# Patient Record
Sex: Female | Born: 1940 | Race: White | Hispanic: No | Marital: Married | Smoking: Never smoker
Health system: Southern US, Community
[De-identification: ages and names within clinical notes are randomized; demographics above are authoritative.]

## PROBLEM LIST (undated history)

## (undated) DIAGNOSIS — S32000A Wedge compression fracture of unspecified lumbar vertebra, initial encounter for closed fracture: Secondary | ICD-10-CM

## (undated) DIAGNOSIS — D649 Anemia, unspecified: Secondary | ICD-10-CM

## (undated) DIAGNOSIS — F419 Anxiety disorder, unspecified: Secondary | ICD-10-CM

## (undated) DIAGNOSIS — K449 Diaphragmatic hernia without obstruction or gangrene: Secondary | ICD-10-CM

## (undated) DIAGNOSIS — F429 Obsessive-compulsive disorder, unspecified: Secondary | ICD-10-CM

## (undated) HISTORY — PX: COLONOSCOPY: SHX174

## (undated) HISTORY — PX: ABDOMINAL HYSTERECTOMY: SHX81

---

## 2016-08-15 ENCOUNTER — Emergency Department (HOSPITAL_BASED_OUTPATIENT_CLINIC_OR_DEPARTMENT_OTHER): Payer: Medicare Other

## 2016-08-15 ENCOUNTER — Encounter (HOSPITAL_BASED_OUTPATIENT_CLINIC_OR_DEPARTMENT_OTHER): Payer: Self-pay | Admitting: Emergency Medicine

## 2016-08-15 ENCOUNTER — Emergency Department (HOSPITAL_BASED_OUTPATIENT_CLINIC_OR_DEPARTMENT_OTHER)
Admission: EM | Admit: 2016-08-15 | Discharge: 2016-08-15 | Disposition: A | Discharge status: Home / Self Care | Payer: Medicare Other | Attending: Emergency Medicine | Admitting: Emergency Medicine

## 2016-08-15 DIAGNOSIS — H6122 Impacted cerumen, left ear: Secondary | ICD-10-CM | POA: Insufficient documentation

## 2016-08-15 DIAGNOSIS — Z79891 Long term (current) use of opiate analgesic: Secondary | ICD-10-CM | POA: Diagnosis not present

## 2016-08-15 DIAGNOSIS — R42 Dizziness and giddiness: Secondary | ICD-10-CM

## 2016-08-15 HISTORY — DX: Anemia, unspecified: D64.9

## 2016-08-15 HISTORY — DX: Anxiety disorder, unspecified: F41.9

## 2016-08-15 HISTORY — DX: Obsessive-compulsive disorder, unspecified: F42.9

## 2016-08-15 HISTORY — DX: Diaphragmatic hernia without obstruction or gangrene: K44.9

## 2016-08-15 LAB — COMPREHENSIVE METABOLIC PANEL
ALK PHOS: 79 U/L (ref 38–126)
ALT: 15 U/L (ref 14–54)
ANION GAP: 5 (ref 5–15)
AST: 20 U/L (ref 15–41)
Albumin: 3.8 g/dL (ref 3.5–5.0)
BUN: 22 mg/dL — ABNORMAL HIGH (ref 6–20)
CALCIUM: 9.2 mg/dL (ref 8.9–10.3)
CO2: 25 mmol/L (ref 22–32)
CREATININE: 0.63 mg/dL (ref 0.44–1.00)
Chloride: 108 mmol/L (ref 101–111)
Glucose, Bld: 92 mg/dL (ref 65–99)
Potassium: 4.1 mmol/L (ref 3.5–5.1)
Sodium: 138 mmol/L (ref 135–145)
Total Bilirubin: 0.3 mg/dL (ref 0.3–1.2)
Total Protein: 6.7 g/dL (ref 6.5–8.1)

## 2016-08-15 LAB — CBC WITH DIFFERENTIAL/PLATELET
Basophils Absolute: 0.1 10*3/uL (ref 0.0–0.1)
Basophils Relative: 1 %
EOS PCT: 2 %
Eosinophils Absolute: 0.2 10*3/uL (ref 0.0–0.7)
HCT: 41.3 % (ref 36.0–46.0)
HEMOGLOBIN: 13.3 g/dL (ref 12.0–15.0)
LYMPHS ABS: 1.6 10*3/uL (ref 0.7–4.0)
LYMPHS PCT: 19 %
MCH: 26.4 pg (ref 26.0–34.0)
MCHC: 32.2 g/dL (ref 30.0–36.0)
MCV: 82.1 fL (ref 78.0–100.0)
MONOS PCT: 10 %
Monocytes Absolute: 0.8 10*3/uL (ref 0.1–1.0)
Neutro Abs: 5.6 10*3/uL (ref 1.7–7.7)
Neutrophils Relative %: 68 %
PLATELETS: 242 10*3/uL (ref 150–400)
RBC: 5.03 MIL/uL (ref 3.87–5.11)
RDW: 20.1 % — ABNORMAL HIGH (ref 11.5–15.5)
WBC: 8.2 10*3/uL (ref 4.0–10.5)

## 2016-08-15 LAB — TROPONIN I

## 2016-08-15 MED ORDER — SODIUM CHLORIDE 0.9 % IV BOLUS (SEPSIS)
1000.0000 mL | Freq: Once | INTRAVENOUS | Status: AC
  Administered 2016-08-15: 1000 mL via INTRAVENOUS

## 2016-08-15 NOTE — ED Notes (Signed)
Called and spoke to Dr. Lawerance Bachuehle's office.  They related that patient has been complaining of dizziness, weakness, and near syncopal episodes.  They had instructed the patient to go to the ED three times thus far.  Pt presented to our ED requesting blood work only.

## 2016-08-15 NOTE — ED Triage Notes (Signed)
Pt had colonoscopy on Friday.  Went well.  Pt felt dizziness with diaphoresis and generalized weakness which decreased by Monday.  Pt continued to have symptoms but less than Sunday.  Pt able to walk to room without difficulty.

## 2016-08-15 NOTE — ED Provider Notes (Signed)
[facility]   CSN: [id number] Arrival date & time: 08/15/16  [id number]     History   Chief Complaint Chief Complaint  Patient presents with  . Dizziness    HPI Amber Ruiz is a 75 y.o. female hx of iron deficiency anemia, hiatal hernia, here with vertigo, dizziness, near syncope. Patient had a colonoscopy done 4 days ago that was unremarkable. Done as part of a workup for iron deficiency anemia. 2 days ago she went out to eat with her friends at church and had an episode of dizziness and diaphoresis and vertigo. She states that she had trouble walking at that time and went home and laid down and felt better. Yesterday she felt slightly better and felt slightly lightheaded but the vertigo has improved. Felt like she is on pass out but did not pass out. She denies any chest pain or abdominal pain or abdominal distention. She denies any vomiting. Sent here by her doctor for possible dehydration. Denies focal weakness or trouble speaking.   The history is provided by the patient.    Past Medical History:  Diagnosis Date  . Anemia   . Anxiety   . Hiatal hernia   . OCD (obsessive compulsive disorder)     There are no active problems to display for this patient.   Past Surgical History:  Procedure Laterality Date  . ABDOMINAL HYSTERECTOMY    . COLONOSCOPY      OB History    No data available       Home Medications    Prior to Admission medications   Medication Sig Start Date End Date Taking? Authorizing Provider  cetirizine (ZYRTEC) 5 MG tablet Take 5 mg by mouth daily.   Yes Historical Provider, MD  citalopram (CELEXA) 40 MG tablet Take 20 mg by mouth daily.    Yes Historical Provider, MD  pantoprazole (PROTONIX) 20 MG tablet Take 20 mg by mouth daily.   Yes Historical Provider, MD    Family History No family history on file.  Social History Social History  Substance Use Topics  . Smoking status: Never Smoker  . Smokeless tobacco: Never  Used  . Alcohol use No     Allergies   Review of patient's allergies indicates no known allergies.   Review of Systems Review of Systems  Neurological: Positive for dizziness.  All other systems reviewed and are negative.    Physical Exam Updated Vital Signs BP 134/63 (BP Location: Left Arm)   Pulse 63 Comment: standing  Temp 98.3 F (36.8 C) (Oral)   Resp 16 Comment: standing  Ht 5\' 2"  (1.575 m)   Wt 133 lb (60.3 kg)   SpO2 99% Comment: standing  BMI 24.33 kg/m   Physical Exam  Constitutional: She is oriented to person, place, and time.  Chronically ill, NAD   HENT:  Head: Normocephalic.  L cerumen impaction, R TM nl. OP clear   Eyes: EOM are normal. Pupils are equal, round, and reactive to light.  No obvious nystagmus   Neck: Normal range of motion. Neck supple.  Cardiovascular: Normal rate, regular rhythm and normal heart sounds.   Pulmonary/Chest: Effort normal and breath sounds normal. No respiratory distress. She has no wheezes. She has no rales.  Abdominal: Soft. Bowel sounds are normal. She exhibits no distension. There is no tenderness.  Musculoskeletal: Normal range of motion.  Neurological: She is alert and oriented to person, place, and time. No cranial nerve deficit. Coordination normal.  CN 2-12  intact. Nl strength throughout. Nl gait   Skin: Skin is warm.  Psychiatric: She has a normal mood and affect.  Nursing note and vitals reviewed.    ED Treatments / Results  Labs (all labs ordered are listed, but only abnormal results are displayed) Labs Reviewed  CBC WITH DIFFERENTIAL/PLATELET  COMPREHENSIVE METABOLIC PANEL  TROPONIN I    EKG  EKG Interpretation None       Radiology Ct Head Wo Contrast  Result Date: 08/15/2016 CLINICAL DATA:  Patient with dizziness, weakness and syncopal episodes. EXAM: CT HEAD WITHOUT CONTRAST TECHNIQUE: Contiguous axial images were obtained from the base of the skull through the vertex without intravenous  contrast. COMPARISON:  None. FINDINGS: Brain: Ventricles and sulci are appropriate for patient's age. No evidence for acute cortically based infarct, intracranial hemorrhage, mass lesion or mass-effect. Vascular: No hyperdense vessel or unexpected calcification. Skull: Normal. Negative for fracture or focal lesion. Sinuses/Orbits: No acute finding. Other: None. IMPRESSION: No acute intracranial process. Electronically Signed   By: Annia Belt M.D.   On: 08/15/2016 10:35    Procedures .Ear Cerumen Removal Date/Time: 08/15/2016 10:46 AM Performed by: Charlynne Pander Authorized by: Charlynne Pander   Consent:    Consent obtained:  Verbal   Consent given by:  Patient   Risks discussed:  Bleeding   Alternatives discussed:  No treatment Procedure details:    Location:  L ear   Procedure type: curette   Post-procedure details:    Inspection:  TM intact   Patient tolerance of procedure:  Tolerated well, no immediate complications    (including critical care time)  Medications Ordered in ED Medications  sodium chloride 0.9 % bolus 1,000 mL (not administered)     Initial Impression / Assessment and Plan / ED Course  I have reviewed the triage vital signs and the nursing notes.  Pertinent labs & imaging results that were available during my care of the patient were reviewed by me and considered in my medical decision making (see chart for details).  Clinical Course    Amber Ruiz is a 75 y.o. female here with vertigo, dizziness, near syncope. Patient's vertigo was worse 2 days ago and now improved. Nl neuro exam now and has L cerumen impaction. Most likely peripheral vertigo either from BPPV or cerumen. Consider stroke as well but since the vertigo is improved and symptoms onset 2 days ago and nl neuro exam, if CT head showed no obvious stroke, does not need MRI. No hx of CAD and will get labs and orthostatics, trop x 1, EKG. Will hydrate and reassess.   11:46 AM Patient not  orthostatic. BUN 22 likely from mild dehydration. No vomiting. CT head nl. Given 1 L NS bolus. Will dc home.   Final Clinical Impressions(s) / ED Diagnoses   Final diagnoses:  None    New Prescriptions New Prescriptions   No medications on file     Charlynne Pander, MD 08/15/16 1147

## 2016-08-15 NOTE — Discharge Instructions (Signed)
Stay hydrated.   Rest for several days.   See your doctor  Return to ER if you have worse dizziness, light headedness, passing out, vertigo, weakness, trouble speaking

## 2016-08-15 NOTE — ED Notes (Signed)
Patient transported to X-ray 

## 2019-06-16 ENCOUNTER — Emergency Department (HOSPITAL_BASED_OUTPATIENT_CLINIC_OR_DEPARTMENT_OTHER)
Admission: EM | Admit: 2019-06-16 | Discharge: 2019-06-16 | Disposition: A | Discharge status: Home / Self Care | Payer: Medicare Other | Attending: Emergency Medicine | Admitting: Emergency Medicine

## 2019-06-16 ENCOUNTER — Emergency Department (HOSPITAL_BASED_OUTPATIENT_CLINIC_OR_DEPARTMENT_OTHER): Payer: Medicare Other

## 2019-06-16 ENCOUNTER — Other Ambulatory Visit: Payer: Self-pay

## 2019-06-16 ENCOUNTER — Encounter (HOSPITAL_BASED_OUTPATIENT_CLINIC_OR_DEPARTMENT_OTHER): Payer: Self-pay | Admitting: *Deleted

## 2019-06-16 DIAGNOSIS — Z79899 Other long term (current) drug therapy: Secondary | ICD-10-CM | POA: Insufficient documentation

## 2019-06-16 DIAGNOSIS — R42 Dizziness and giddiness: Secondary | ICD-10-CM | POA: Diagnosis present

## 2019-06-16 LAB — CBC WITH DIFFERENTIAL/PLATELET
Abs Immature Granulocytes: 0.02 10*3/uL (ref 0.00–0.07)
Basophils Absolute: 0.1 10*3/uL (ref 0.0–0.1)
Basophils Relative: 1 %
Eosinophils Absolute: 0 10*3/uL (ref 0.0–0.5)
Eosinophils Relative: 0 %
HCT: 48.9 % — ABNORMAL HIGH (ref 36.0–46.0)
Hemoglobin: 15.3 g/dL — ABNORMAL HIGH (ref 12.0–15.0)
Immature Granulocytes: 0 %
Lymphocytes Relative: 21 %
Lymphs Abs: 1.5 10*3/uL (ref 0.7–4.0)
MCH: 27.4 pg (ref 26.0–34.0)
MCHC: 31.3 g/dL (ref 30.0–36.0)
MCV: 87.6 fL (ref 80.0–100.0)
Monocytes Absolute: 0.6 10*3/uL (ref 0.1–1.0)
Monocytes Relative: 8 %
Neutro Abs: 5.2 10*3/uL (ref 1.7–7.7)
Neutrophils Relative %: 70 %
Platelets: 253 10*3/uL (ref 150–400)
RBC: 5.58 MIL/uL — ABNORMAL HIGH (ref 3.87–5.11)
RDW: 12.2 % (ref 11.5–15.5)
WBC: 7.4 10*3/uL (ref 4.0–10.5)
nRBC: 0 % (ref 0.0–0.2)

## 2019-06-16 LAB — COMPREHENSIVE METABOLIC PANEL
ALT: 16 U/L (ref 0–44)
AST: 19 U/L (ref 15–41)
Albumin: 4 g/dL (ref 3.5–5.0)
Alkaline Phosphatase: 90 U/L (ref 38–126)
Anion gap: 9 (ref 5–15)
BUN: 16 mg/dL (ref 8–23)
CO2: 28 mmol/L (ref 22–32)
Calcium: 9.3 mg/dL (ref 8.9–10.3)
Chloride: 102 mmol/L (ref 98–111)
Creatinine, Ser: 0.7 mg/dL (ref 0.44–1.00)
GFR calc Af Amer: >60 mL/min (ref >60)
GFR calc non Af Amer: >60 mL/min (ref >60)
Glucose, Bld: 112 mg/dL — ABNORMAL HIGH (ref 70–99)
Potassium: 4.3 mmol/L (ref 3.5–5.1)
Sodium: 139 mmol/L (ref 135–145)
Total Bilirubin: 0.4 mg/dL (ref 0.3–1.2)
Total Protein: 6.9 g/dL (ref 6.5–8.1)

## 2019-06-16 MED ORDER — MECLIZINE HCL 25 MG PO TABS: 25.0000 mg | ORAL_TABLET | Freq: Three times a day (TID) | ORAL | 0 refills | Status: DC | PRN

## 2019-06-16 NOTE — ED Provider Notes (Signed)
[facility] EMERGENCY DEPARTMENT Provider Note   CSN: [id number] Arrival date & time: 06/16/19  1507     History   Chief Complaint Chief Complaint  Patient presents with  . Dizziness    HPI Amber Ruiz is a 78 y.o. female.     Patient with a complaint of dizziness.  Patient stated that the dizziness been ongoing for 2 weeks.  She has been taking meclizine provided by her primary care doctor as needed.  Not taking on a regular basis.  She said for several weeks prior to that she has had some sort of change in taste.  She has been tested for COVID and it was negative.  No fevers no upper respiratory symptoms.  The dizziness is consistent with vertigo sometimes intense.  Intense episode this morning associated with some diaphoresis and nausea and vomiting.  No visual changes no weakness in her arms or legs no numbness.  No coordination problems.  When she is not having the vertigo everything works fine.  No headache.  No falls or injury.     Past Medical History:  Diagnosis Date  . Anemia   . Anxiety   . Hiatal hernia   . OCD (obsessive compulsive disorder)     There are no active problems to display for this patient.   Past Surgical History:  Procedure Laterality Date  . ABDOMINAL HYSTERECTOMY    . COLONOSCOPY       OB History   No obstetric history on file.      Home Medications    Prior to Admission medications   Medication Sig Start Date End Date Taking? Authorizing Provider  alendronate (FOSAMAX) 70 MG tablet TK 1 T PO  PO Q WEEK 01/14/18  Yes [provider]  cetirizine (ZYRTEC) 5 MG tablet Take 5 mg by mouth daily.   Yes [provider]  citalopram (CELEXA) 40 MG tablet Take 20 mg by mouth daily.    Yes [provider]  lisinopril (ZESTRIL) 10 MG tablet Take 10 mg by mouth daily.   Yes [provider]  meclizine (ANTIVERT) 25 MG tablet Take 25 mg by mouth 3 (three) times daily as needed for dizziness.   Yes  [provider]  pantoprazole (PROTONIX) 20 MG tablet Take 20 mg by mouth daily.   Yes [provider]  Ascorbic Acid (VITAMIN C) 100 MG tablet Take by mouth.    [provider]  meclizine (ANTIVERT) 25 MG tablet Take 1 tablet (25 mg total) by mouth 3 (three) times daily as needed for dizziness. 06/16/19   Vanetta MuldersZackowski, Quintasha Gren, MD    Family History No family history on file.  Social History Social History   Tobacco Use  . Smoking status: Never Smoker  . Smokeless tobacco: Never Used  Substance Use Topics  . Alcohol use: No  . Drug use: Not on file     Allergies   Patient has no known allergies.   Review of Systems Review of Systems  Constitutional: Positive for diaphoresis. Negative for chills and fever.  HENT: Negative for congestion, rhinorrhea and sore throat.   Eyes: Negative for visual disturbance.  Respiratory: Negative for cough and shortness of breath.   Cardiovascular: Negative for chest pain and leg swelling.  Gastrointestinal: Positive for nausea and vomiting. Negative for abdominal pain and diarrhea.  Genitourinary: Negative for dysuria.  Musculoskeletal: Negative for back pain and neck pain.  Skin: Negative for rash.  Neurological: Positive for dizziness. Negative for light-headedness  and headaches.  Hematological: Does not bruise/bleed easily.  Psychiatric/Behavioral: Negative for confusion.     Physical Exam Updated Vital Signs BP (!) 183/91   Pulse 63   Temp 98.4 F (36.9 C) (Oral)   Resp 17   Ht 1.524 m (5')   Wt 61.2 kg   SpO2 96%   BMI 26.37 kg/m   Physical Exam Vitals signs and nursing note reviewed.  Constitutional:      General: She is not in acute distress.    Appearance: Normal appearance. She is well-developed.  HENT:     Head: Normocephalic and atraumatic.  Eyes:     Extraocular Movements: Extraocular movements intact.     Conjunctiva/sclera: Conjunctivae normal.     Pupils: Pupils are equal, round, and  reactive to light.  Neck:     Musculoskeletal: Normal range of motion and neck supple.  Cardiovascular:     Rate and Rhythm: Normal rate and regular rhythm.     Heart sounds: No murmur.  Pulmonary:     Effort: Pulmonary effort is normal. No respiratory distress.     Breath sounds: Normal breath sounds.  Abdominal:     Palpations: Abdomen is soft.     Tenderness: There is no abdominal tenderness.  Musculoskeletal: Normal range of motion.  Skin:    General: Skin is warm and dry.     Capillary Refill: Capillary refill takes less than 2 seconds.  Neurological:     General: No focal deficit present.     Mental Status: She is alert and oriented to person, place, and time.     Cranial Nerves: No cranial nerve deficit.     Sensory: No sensory deficit.     Motor: No weakness.     Coordination: Coordination normal.      ED Treatments / Results  Labs (all labs ordered are listed, but only abnormal results are displayed) Labs Reviewed  COMPREHENSIVE METABOLIC PANEL - Abnormal; Notable for the following components:      Result Value   Glucose, Bld 112 (*)    All other components within normal limits  CBC WITH DIFFERENTIAL/PLATELET - Abnormal; Notable for the following components:   RBC 5.58 (*)    Hemoglobin 15.3 (*)    HCT 48.9 (*)    All other components within normal limits    EKG EKG Interpretation  Date/Time:  Monday June 16 2019 16:44:04 EDT Ventricular Rate:  56 PR Interval:    QRS Duration: 89 QT Interval:  515 QTC Calculation: 498 R Axis:   76 Text Interpretation:  Sinus rhythm Nonspecific T abnormalities, lateral leads Borderline prolonged QT interval Baseline wander in lead(s) V2 Confirmed by Fredia Sorrow [phone]) on 06/16/2019 5:39:26 PM   Radiology Ct Head Wo Contrast  Result Date: 06/16/2019 CLINICAL DATA:  Ataxia.  Rule out stroke EXAM: CT HEAD WITHOUT CONTRAST TECHNIQUE: Contiguous axial images were obtained from the base of the skull through the vertex  without intravenous contrast. COMPARISON:  CT head 08/15/2016 FINDINGS: Brain: Ventricle size normal. Negative for infarct, hemorrhage, mass. No midline shift. Vascular: Negative for hyperdense vessel Skull: Negative Sinuses/Orbits: Negative Other: None IMPRESSION: Negative CT head Electronically Signed   By: Franchot Gallo M.D.   On: 06/16/2019 17:06    Procedures Procedures (including critical care time)  Medications Ordered in ED Medications - No data to display   Initial Impression / Assessment and Plan / ED Course  I have reviewed the triage vital signs and the nursing notes.  Pertinent  labs & imaging results that were available during my care of the patient were reviewed by me and considered in my medical decision making (see chart for details).        Patient with intermittent vertigo symptoms.  Actually clarification from her daughter's symptoms actually really started in May but they are sporadic.  Never had several days in a row with the symptoms.  This is the reason why the patient was taken the meclizine as needed.  Does have follow-up with ear nose and throat already scheduled.  Recommend outpatient MRI follow-up with her primary care doctor and consideration for neurology referral as well.  Patient completely asymptomatic currently.  Patient's labs EKG cardiac monitoring and head CT without any acute findings.  Final Clinical Impressions(s) / ED Diagnoses   Final diagnoses:  Vertigo    ED Discharge Orders         Ordered    meclizine (ANTIVERT) 25 MG tablet  3 times daily PRN     06/16/19 1757           Vanetta MuldersZackowski, Gloris Shiroma, MD 06/16/19 1802

## 2019-06-16 NOTE — ED Triage Notes (Signed)
Dizziness x 2 weeks. She has been taking Meclizine with some relief. No pain.

## 2019-06-16 NOTE — Discharge Instructions (Addendum)
Keep your appointment ear nose and throat.  Make an appointment to go back to your regular doctor.  Feel that outpatient MRI would be warranted.  And also consideration for follow-up with neurology.  Take the meclizine as needed for the dizziness.  May be reasonable to take it for the next 24 hours.  Take the medication every 8 hours.  But since her symptoms have been very sporadic it is probably reasonable not to take it every day.

## 2019-06-16 NOTE — ED Notes (Signed)
Patient transported to CT 

## 2019-06-30 ENCOUNTER — Encounter: Payer: Self-pay | Admitting: Neurology

## 2019-06-30 ENCOUNTER — Ambulatory Visit (INDEPENDENT_AMBULATORY_CARE_PROVIDER_SITE_OTHER): Payer: Medicare Other | Admitting: Neurology

## 2019-06-30 ENCOUNTER — Telehealth: Payer: Self-pay | Admitting: Neurology

## 2019-06-30 ENCOUNTER — Other Ambulatory Visit: Payer: Self-pay

## 2019-06-30 VITALS — BP 121/76 | HR 83 | Temp 98.6°F | Ht 59.0 in | Wt 133.3 lb

## 2019-06-30 DIAGNOSIS — R413 Other amnesia: Secondary | ICD-10-CM | POA: Diagnosis not present

## 2019-06-30 DIAGNOSIS — H814 Vertigo of central origin: Secondary | ICD-10-CM | POA: Diagnosis not present

## 2019-06-30 DIAGNOSIS — E538 Deficiency of other specified B group vitamins: Secondary | ICD-10-CM

## 2019-06-30 MED ORDER — TOPIRAMATE 25 MG PO TABS: ORAL_TABLET | ORAL | 3 refills | Status: DC

## 2019-06-30 NOTE — Patient Instructions (Signed)
We will start Topamax for the vertigo and vision changes.   Topamax (topiramate) is a seizure medication that has an FDA approval for seizures and for migraine headache. Potential side effects of this medication include weight loss, cognitive slowing, tingling in the fingers and toes, and carbonated drinks will taste bad. If any significant side effects are noted on this drug, please contact our office.

## 2019-06-30 NOTE — Telephone Encounter (Signed)
Medicare/bcbs supp order sent to GI. No auth they will reach out to the patient to schedule.  

## 2019-06-30 NOTE — Progress Notes (Signed)
Reason for visit: Vertigo  Referring physician: Dr. Loyce Dys is a 78 y.o. female  History of present illness:  Amber Ruiz is a 78 year old right-handed white female with a history of onset of vertigo that began in May 2020.  The patient had onset of vertigo in the morning which lasted about 4 hours, the patient was not able to get up out of bed and to move around.  She went to her primary care physician, she was given a prescription for meclizine which seemed to help.  The patient claims that meclizine makes her sleep and when she wakes up the vertigo is gone.  The patient did not have any vertigo until the latter part of July 2020.  The patient had a severe episode of vertigo on 27 July and went to the emergency room.  A CT scan of the brain was done at that time.  This study was unremarkable.  The patient has had frequent episodes of vertigo since that time, more recently she has gone about 3 days without any vertigo but usually it is a daily event, usually worse in the morning.  The patient will take Antivert in the morning if she feels vertiginous, she will sleep for several hours and when she wakes up she feels better.  She has noted on several occasions with the vertigo that she gets a kaleidoscope visual change unassociated with bright flashing lights or colors but with geometric shapes that would float around in the vision.  At other times, she may note oscillopsia.  The patient reports that she did have migraines when she was younger, but she has not had any migraine headaches for many years.  She has not had any recent medication changes.  At times, she may have diaphoresis with the vertigo, she may have some nausea but she does not vomit.  She has no clear relationship of head position to the vertigo but rapid head movements may bring on dizziness.  The biggest single factor that improves the vertigo is sleep.  The daughter also has noted some mild problems with memory that  occurred recently.  The patient does report some word finding problems.  She lives alone currently.  The patient was seen through ENT, no definite etiology of her vertigo was noted.  Past Medical History:  Diagnosis Date   Anemia    Anxiety    Hiatal hernia    OCD (obsessive compulsive disorder)     Past Surgical History:  Procedure Laterality Date   ABDOMINAL HYSTERECTOMY     COLONOSCOPY      History reviewed. No pertinent family history.  Social history:  reports that she has never smoked. She has never used smokeless tobacco. She reports that she does not drink alcohol. No history on file for drug.  Medications:  Prior to Admission medications   Medication Sig Start Date End Date Taking? Authorizing Provider  alendronate (FOSAMAX) 70 MG tablet TK 1 T PO  PO Q WEEK 01/14/18  Yes [provider]  Ascorbic Acid (VITAMIN C) 100 MG tablet Take by mouth.   Yes [provider]  cetirizine (ZYRTEC) 5 MG tablet Take 5 mg by mouth daily.   Yes [provider]  citalopram (CELEXA) 40 MG tablet Take 20 mg by mouth daily.    Yes [provider]  lisinopril (ZESTRIL) 10 MG tablet Take 10 mg by mouth daily.   Yes [provider]  meclizine (ANTIVERT) 25 MG tablet Take 1  tablet (25 mg total) by mouth 3 (three) times daily as needed for dizziness. 06/16/19  Yes Vanetta MuldersZackowski, Scott, MD  pantoprazole (PROTONIX) 20 MG tablet Take 20 mg by mouth daily.   Yes [provider]     No Known Allergies  ROS:  Out of a complete 14 system review of symptoms, the patient complains only of the following symptoms, and all other reviewed systems are negative.  Dizziness Memory problems Gait instability  Blood pressure 121/76, pulse 83, temperature 98.6 F (37 C), temperature source Temporal, height 4\' 11"  (1.499 m), weight 133 lb 5 oz (60.5 kg).  Physical Exam  General: The patient is alert and cooperative at the time of the  examination.  Eyes: Pupils are equal, round, and reactive to light. Discs are flat bilaterally.  Neck: The neck is supple, no carotid bruits are noted.  Respiratory: The respiratory examination is clear.  Cardiovascular: The cardiovascular examination reveals a regular rate and rhythm, no obvious murmurs or rubs are noted.  Skin: Extremities are without significant edema.  Neurologic Exam  Mental status: The patient is alert and oriented x 3 at the time of the examination. The Mini-Mental status examination done today shows a total score 28/30.  Cranial nerves: Facial symmetry is present. There is good sensation of the face to pinprick and soft touch bilaterally. The strength of the facial muscles and the muscles to head turning and shoulder shrug are normal bilaterally. Speech is well enunciated, no aphasia or dysarthria is noted. Extraocular movements are full. Visual fields are full. The tongue is midline, and the patient has symmetric elevation of the soft palate. No obvious hearing deficits are noted.  Motor: The motor testing reveals 5 over 5 strength of all 4 extremities. Good symmetric motor tone is noted throughout.  Sensory: Sensory testing is intact to pinprick, soft touch, vibration sensation, and position sense on all 4 extremities. No evidence of extinction is noted.  Coordination: Cerebellar testing reveals good finger-nose-finger and heel-to-shin bilaterally. The Nyan-Barrany procedure was done.  This increased the subjective vertigo and the patient had rotatory and horizontal nystagmus that was present with looking to the right and to the left.  Gait and station: Gait is slightly wide-based, unsteady.  Tandem gait is unsteady. Romberg is negative. No drift is seen.  Reflexes: Deep tendon reflexes are symmetric and normal bilaterally. Toes are downgoing bilaterally.   CT head 06/16/19:  IMPRESSION: Negative CT head  * CT scan images were reviewed online. I agree with  the written report.    Assessment/Plan:  1.  Episodic vertigo, visual disturbances  2.  Mild memory disturbance  The patient reports an unusual history of vertigo that may be associated with visual distortions associated with a kaleidoscope visual phenomenon.  The patient gets no headache with this.  She does have a prior history of migraine headache however.  The patient could potentially have a migraine as an etiology for these events.  MRI of the brain will be done, the patient will be placed on Topamax.  If the Topamax does not improve her symptoms, may consider physical therapy referral for vestibular rehab.  She will follow-up in 3 months.  Blood work will be done today to evaluate the memory issue, this will be followed over time.  Marlan Palau. Keith Dwana Garin MD 06/30/2019 11:41 AM  [facility] [address] [place], [place] [id number]  Phone [phone] Fax [phone]

## 2019-07-01 ENCOUNTER — Telehealth: Payer: Self-pay

## 2019-07-01 LAB — RPR: RPR Ser Ql: NONREACTIVE

## 2019-07-01 LAB — SEDIMENTATION RATE: Sed Rate: 7 mm/hr (ref 0–40)

## 2019-07-01 LAB — VITAMIN B12: Vitamin B-12: 452 pg/mL (ref 232–1245)

## 2019-07-01 NOTE — Telephone Encounter (Signed)
-----   Message from Kathrynn Ducking, MD sent at 07/01/2019  7:23 AM EDT -----  The blood work results are unremarkable. Please call the patient. ----- Message ----- From: Lavone Neri Lab Results In Sent: 07/01/2019   5:38 AM EDT To: Kathrynn Ducking, MD

## 2019-07-01 NOTE — Telephone Encounter (Signed)
I reached out to the pt's daughter Mickel Baas) and advised on labs. She verbalized understanding and she will pass results on to the pt. Mickel Baas was present during 06/30/19 visit and was listed on DPR.

## 2019-07-07 ENCOUNTER — Telehealth: Payer: Self-pay

## 2019-07-07 NOTE — Telephone Encounter (Signed)
I reached out to the pt's daughter ( ok per dpr). She and I discussed this message. She states over the last week the pt has had 2 spells. I advised the pt's daughter to continue her medication and titrate up recommended by Dr. Jannifer Franklin to 1 pill bid of the topamax.  Daughter was advised after being on this dosage a week to call our office back and report if sx are still the same.  I also advised if her sx worsened to let our office know.

## 2019-07-07 NOTE — Telephone Encounter (Signed)
Patient's daughter called and states that Dr. Jannifer Franklin had put her mother on medication for the dizzy spells she's been having and she is just wondering a time frame for when they should maybe start seeing a difference because she is still having many of these spells even since starting on the medication. Mickel Baas said that if she doesn't answer, she is perfectly fine with you leaving a detailed message.

## 2019-07-24 ENCOUNTER — Other Ambulatory Visit: Payer: Self-pay

## 2019-07-24 ENCOUNTER — Ambulatory Visit
Admission: RE | Admit: 2019-07-24 | Discharge: 2019-07-24 | Disposition: A | Discharge status: Home / Self Care | Payer: Medicare Other | Source: Ambulatory Visit | Attending: Neurology | Admitting: Neurology

## 2019-07-24 DIAGNOSIS — H814 Vertigo of central origin: Secondary | ICD-10-CM

## 2019-07-24 MED ORDER — GADOBENATE DIMEGLUMINE 529 MG/ML IV SOLN
12.0000 mL | Freq: Once | INTRAVENOUS | Status: AC | PRN
  Administered 2019-07-24: 12 mL via INTRAVENOUS

## 2019-07-28 ENCOUNTER — Telehealth: Payer: Self-pay | Admitting: Neurology

## 2019-07-28 MED ORDER — TOPIRAMATE 25 MG PO TABS: 75.0000 mg | ORAL_TABLET | Freq: Every day | ORAL | 1 refills | Status: DC

## 2019-07-28 NOTE — Telephone Encounter (Signed)
I called the patient.  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  If the dizziness and vertigo began shortly after she started the lisinopril, she may give a trial off of medications if her symptoms improve.  She believes that she has gained benefit from the Topamax, we will go up to 75 mg at night, and if she is not any better in a couple weeks she will call and I will set up vestibular rehabilitation.  She has not had any further episodes of kaleidoscope vision.  She only had one episode described.    MRI brain 07/25/19:  IMPRESSION: This MRI of the brain with and without contrast shows the following: 1.   Mild chronic microvascular ischemic change.  None of the foci appear to be acute. 2.   The 7th/8th nerve complexes appear normal. 3.   No acute findings and a normal enhancement pattern.

## 2019-07-29 ENCOUNTER — Other Ambulatory Visit: Payer: Medicare Other

## 2019-07-30 ENCOUNTER — Other Ambulatory Visit: Payer: Medicare Other

## 2019-08-24 ENCOUNTER — Other Ambulatory Visit: Payer: Self-pay | Admitting: Neurology

## 2019-08-25 ENCOUNTER — Telehealth: Payer: Self-pay | Admitting: Neurology

## 2019-08-25 MED ORDER — PROPRANOLOL HCL 20 MG PO TABS: 20.0000 mg | ORAL_TABLET | Freq: Two times a day (BID) | ORAL | 3 refills | Status: DC

## 2019-08-25 NOTE — Addendum Note (Signed)
Addended by: Kathrynn Ducking on: 08/25/2019 04:21 PM   Modules accepted: Orders

## 2019-08-25 NOTE — Telephone Encounter (Signed)
I reached out to the pt's daughter Mickel Baas ok per dpr ) She wanted to report the pt still still having dizzy episodes (1 every few weeks). During these episodes pt will take Meclizine and for the next 3-4 days the pt will sleep pretty much all day. Daughter/Pt wanted to know if an alternative to meclizine could be recommended?  Daughter also reports over all the pt has been unsteady on her feet ( no falls), sour taste has been present in her mouth, memory has declined along with penmanship. Pt is scheduled for a f/u on 10/01/2019.    Daughter wanted to see if any recommendations could be made? Pt is currently taking tompax 75 mg at night and reports over all the medication is helping and she is tolerating well.   Best call back # is [phone].

## 2019-08-25 NOTE — Telephone Encounter (Signed)
I called the patient, talk with the daughter.  The Topamax has helped the frequency of the episodes of vertigo but not limited to them.  She may have one episode every week or 2, but when she does have the events they are quite significant, patient has nausea and vomiting, unable to walk.  She will feel wiped out the next day, still unable to walk straight.  The meclizine 25 mg tablets causes significant drowsiness.  The Topamax is resulting in some taste alteration, the patient is not eating well.  We will taper off the Topamax by 25 mg every 5 days until off, will start propranolol 20 mg twice daily.  She will cut the meclizine tablet in half taking 12.5 mg instead of 25 mg.  If the propranolol is not effective, we may try Aimovig or Ajovy, may consider vestibular rehab.

## 2019-08-25 NOTE — Telephone Encounter (Signed)
Pt daughter(on DPR) is asking for a call from RN to discuss pt  symptoms

## 2019-09-12 ENCOUNTER — Other Ambulatory Visit (HOSPITAL_COMMUNITY): Payer: Self-pay | Admitting: Neurology

## 2019-09-12 ENCOUNTER — Telehealth: Payer: Self-pay | Admitting: Neurology

## 2019-09-12 DIAGNOSIS — R42 Dizziness and giddiness: Secondary | ICD-10-CM

## 2019-09-12 DIAGNOSIS — H8110 Benign paroxysmal vertigo, unspecified ear: Secondary | ICD-10-CM

## 2019-09-12 NOTE — Telephone Encounter (Signed)
Pt son(on DPR) is asking for a call from RN to discuss a recent change in pt's medications as a result of pt having more episodes.  Please call

## 2019-09-12 NOTE — Progress Notes (Signed)
I received a phone call from answering machine from the patient's son stating that she was having one of her vertigo episodes.  She is getting partial relief with meclizine.  I recommended that she take it 3 times a day till the episode resolves.  I also put in ambulatory referral to vestibular rehab for dizziness exercises.  She was advised to call Dr. Jannifer Franklin next week for further advice.  He voiced understanding.

## 2019-09-15 NOTE — Telephone Encounter (Signed)
I reached out to the pt's son. He states he spoke with the on call physician in regards to this message over the weekend ( note not in epic). Pt's son reports he has no questions/concerns for me at this time.

## 2019-09-17 MED ORDER — PROPRANOLOL HCL 20 MG PO TABS: 40.0000 mg | ORAL_TABLET | Freq: Two times a day (BID) | ORAL | 1 refills | Status: DC

## 2019-09-17 NOTE — Telephone Encounter (Signed)
Pts daughter Mickel Baas [facility]) called in and stated they do have more questions , she wants to know if they need to increase the Propranolol due to pt having more episodes

## 2019-09-17 NOTE — Addendum Note (Signed)
Addended by: Kathrynn Ducking on: 09/17/2019 04:56 PM   Modules accepted: Orders

## 2019-09-17 NOTE — Telephone Encounter (Signed)
I called the daughter.  The patient is off of Topamax and clearly the vertigo has worsened coming off of this.  The vertigo had become much less frequent, only once a week or so, but now the episodes are now back to being daily.  The patient is not having headache.  I will set her up for vestibular rehab, we will double the dose of the propranolol to 40 mg twice daily.  The patient will be seen on 11 November, if the episodes have not improved, I will try to get her started on Aimovig.

## 2019-09-17 NOTE — Telephone Encounter (Signed)
Pt's daughter would like discuss with MD about increasing propranolol dosage due to the pt's vertigo episodes still being present. Pt's son spoke with Dr. Leonie Man on 09/12/2019 and he recommendations are as follows:   Garvin Fila, MD at 09/12/2019 5:58 PM  Status: Signed    I received a phone call from answering machine from the patient's son stating that she was having one of her vertigo episodes.  She is getting partial relief with meclizine.  I recommended that she take it 3 times a day till the episode resolves.  I also put in ambulatory referral to vestibular rehab for dizziness exercises.  She was advised to call Dr. Jannifer Franklin next week for further advice.  He voiced understanding.

## 2019-09-20 ENCOUNTER — Other Ambulatory Visit: Payer: Self-pay | Admitting: Neurology

## 2019-09-28 ENCOUNTER — Other Ambulatory Visit: Payer: Self-pay | Admitting: Neurology

## 2019-10-01 ENCOUNTER — Encounter: Payer: Self-pay | Admitting: Neurology

## 2019-10-01 ENCOUNTER — Ambulatory Visit (INDEPENDENT_AMBULATORY_CARE_PROVIDER_SITE_OTHER): Payer: Medicare Other | Admitting: Neurology

## 2019-10-01 ENCOUNTER — Other Ambulatory Visit: Payer: Self-pay

## 2019-10-01 DIAGNOSIS — G43119 Migraine with aura, intractable, without status migrainosus: Secondary | ICD-10-CM | POA: Diagnosis not present

## 2019-10-01 DIAGNOSIS — R42 Dizziness and giddiness: Secondary | ICD-10-CM | POA: Diagnosis not present

## 2019-10-01 MED ORDER — DIVALPROEX SODIUM 250 MG PO DR TAB: DELAYED_RELEASE_TABLET | ORAL | 2 refills | Status: DC

## 2019-10-01 NOTE — Patient Instructions (Signed)
We will start Depakote for the headache and dizziness.  Depakote (valproic acid) is a seizure medication that also has an FDA approval for migraine headache. The most common potential side effects of this medication include weight gain, tremor, or possible stomach upset. This medication can potentially cause liver problems. If confusion is noted on this medication, contact our office immediately.

## 2019-10-01 NOTE — Progress Notes (Signed)
Reason for visit: Vertigo, headache  Amber Ruiz is an 78 y.o. female  History of present illness:  Amber Ruiz is a 78 year old right-handed white female with a history of episodes of severe vertigo and headache that are now occurring about every other day.  The patient comes in today indicating that she clearly does have an association with her headache and her vertigo at this point, initially it was not clear that this was true.  The patient was placed on Topamax which seemed to help her headaches but she had a significant decrease in her ability to taste, she was not eating well.  She went off the medication.  In retrospect, the patient was having problems with taste and decreased appetite before she started the Topamax.  The patient was placed on propranolol, she was worked up to 40 mg twice daily, she believes that this has improved her headaches some but she is still having significant issues.  She has not been able to operate a motor vehicle since March 2020.  The patient indicates that if she can take a nap the headache and vertigo disappears.  The headaches oftentimes occur in the afternoon around 5 or 5:30 PM.  The patient may have nausea and vomiting with the episodes.  The patient has not had any further episodes of kaleidoscope changes in vision.  Past Medical History:  Diagnosis Date  . Anemia   . Anxiety   . Hiatal hernia   . OCD (obsessive compulsive disorder)     Past Surgical History:  Procedure Laterality Date  . ABDOMINAL HYSTERECTOMY    . COLONOSCOPY      History reviewed. No pertinent family history.  Social history:  reports that she has never smoked. She has never used smokeless tobacco. She reports that she does not drink alcohol. No history on file for drug.   No Known Allergies  Medications:  Prior to Admission medications   Medication Sig Start Date End Date Taking? Authorizing Provider  alendronate (FOSAMAX) 70 MG tablet TK 1 T PO  PO Q WEEK  01/14/18   [provider]  Ascorbic Acid (VITAMIN C) 100 MG tablet Take by mouth.    [provider]  cetirizine (ZYRTEC) 5 MG tablet Take 5 mg by mouth daily.    [provider]  citalopram (CELEXA) 40 MG tablet Take 20 mg by mouth daily.     [provider]  lisinopril (ZESTRIL) 10 MG tablet Take 10 mg by mouth daily.    [provider]  meclizine (ANTIVERT) 25 MG tablet Take 1 tablet (25 mg total) by mouth 3 (three) times daily as needed for dizziness. 06/16/19   Fredia Sorrow, MD  pantoprazole (PROTONIX) 20 MG tablet Take 20 mg by mouth daily.    [provider]  propranolol (INDERAL) 20 MG tablet TAKE 2 TABLETS(40 MG) BY MOUTH TWICE DAILY 09/29/19   Kathrynn Ducking, MD  topiramate (TOPAMAX) 25 MG tablet TAKE 3 TABLETS BY MOUTH AT BEDTIME 08/25/19   Kathrynn Ducking, MD    ROS:  Out of a complete 14 system review of symptoms, the patient complains only of the following symptoms, and all other reviewed systems are negative.  Vertigo Headache  Blood pressure 122/78, pulse (!) 55, temperature 97.6 F (36.4 C), temperature source Temporal, height 5' (1.524 m), weight 127 lb (57.6 kg), SpO2 97 %.  Physical Exam  General: The patient is alert and cooperative at the time of the examination.  Skin:  No significant peripheral edema is noted.   Neurologic Exam  Mental status: The patient is alert and oriented x 3 at the time of the examination. The patient has apparent normal recent and remote memory, with an apparently normal attention span and concentration ability.   Cranial nerves: Facial symmetry is present. Speech is normal, no aphasia or dysarthria is noted. Extraocular movements are full. Visual fields are full.  Motor: The patient has good strength in all 4 extremities.  Sensory examination: Soft touch sensation is symmetric on the face, arms, and legs.  Coordination: The patient has good finger-nose-finger and  heel-to-shin bilaterally.  Gait and station: The patient has a normal gait. Tandem gait is slightly unsteady. Romberg is negative. No drift is seen.  Reflexes: Deep tendon reflexes are symmetric.   Assessment/Plan:  1.  Episodic headache and vertigo, probable migraine  The patient has been set up for vestibular rehab, but it appears that the patient may have more of a migrainous pattern to her symptoms.  The patient is incapacitated with the vertigo when it does occur, this may last several hours if she cannot take a nap and get to sleep.  The patient will be placed on Depakote taking 250 mg twice daily for a week and then go to 500 mg twice daily.  She will call for any dose adjustments.  She will stay on propranolol for now.  She will follow-up in 3 to 4 months.  If the Depakote is not effective, we may try Aimovig or Ajovy.  Marlan Palau MD 10/01/2019 11:36 AM  [facility] [address] [place], [place] [address]  Phone [phone] Fax [phone]

## 2019-10-28 ENCOUNTER — Other Ambulatory Visit: Payer: Self-pay | Admitting: Neurology

## 2019-10-29 ENCOUNTER — Telehealth: Payer: Self-pay | Admitting: Neurology

## 2019-10-29 ENCOUNTER — Encounter: Payer: Self-pay | Admitting: Physical Therapy

## 2019-10-29 ENCOUNTER — Ambulatory Visit: Payer: Medicare Other | Attending: Neurology | Admitting: Physical Therapy

## 2019-10-29 ENCOUNTER — Other Ambulatory Visit: Payer: Self-pay

## 2019-10-29 DIAGNOSIS — R2689 Other abnormalities of gait and mobility: Secondary | ICD-10-CM | POA: Diagnosis present

## 2019-10-29 DIAGNOSIS — R42 Dizziness and giddiness: Secondary | ICD-10-CM | POA: Diagnosis not present

## 2019-10-29 DIAGNOSIS — R296 Repeated falls: Secondary | ICD-10-CM | POA: Insufficient documentation

## 2019-10-29 DIAGNOSIS — R2681 Unsteadiness on feet: Secondary | ICD-10-CM | POA: Insufficient documentation

## 2019-10-29 MED ORDER — AIMOVIG 140 MG/ML ~~LOC~~ SOAJ: 140.0000 mg | SUBCUTANEOUS | 4 refills | Status: DC

## 2019-10-29 NOTE — Addendum Note (Signed)
Addended by: Kathrynn Ducking on: 10/29/2019 04:27 PM   Modules accepted: Orders

## 2019-10-29 NOTE — Therapy (Signed)
[facility] [address] [place], [place], [address] Phone: [phone]   Fax:  [phone]  Physical Therapy Evaluation  Patient Details  Name: Amber DomeRebecca Ruiz MRN: [id number] Date of Birth: 09/26/1941 Referring Provider (PT): Micki RileyPramod S Sethi, MD   Encounter Date: 10/29/2019  PT End of Session - 10/29/19 2054    Visit Number  1    Number of Visits  9    Date for PT Re-Evaluation  01/27/20   due to delay in follow up visits - 90 days cert but 8 weeks of treatment   Authorization Type  Medicare and BCBS - 10th visit PN    PT Start Time  1240    PT Stop Time  1330    PT Time Calculation (min)  50 min       Past Medical History:  Diagnosis Date  . Anemia   . Anxiety   . Hiatal hernia   . OCD (obsessive compulsive disorder)     Past Surgical History:  Procedure Laterality Date  . ABDOMINAL HYSTERECTOMY    . COLONOSCOPY      There were no vitals filed for this visit.   Subjective Assessment - 10/29/19 1244    Subjective  Pt accompanied by son; veering to R while ambulating.  Pt seen and referred to PT by neurology for headaches and dizziness that began in May 2020.  In July pt experienced a severe episode of dizziness, nausea and an aura that looked like a kaleidoscope and was seen in the ED; CT scan was negative for acute findings.  Pt was placed on Meclizine which made her sleepy; was then placed on Topamax for headaches but was recently changed to Depakote - headaches are better but is feeling extremely lethargic and son notes that patient is slurring words.  Pt has also been [facility] and was set up for an appointment with otolaryngology for August 2020 but unsure if pt attended that appointment.  Dizziness is slightly better but continues to lose her balance.  Pt denies history of migraines but one physician's note does indicate pt reported having migraines when younger.  Per patient's chart,  she had an episode of dizziness and near syncope in 2017.    Patient is accompained by:  Family member    Pertinent History  anemia, anxiety, HTN, cataract surgery, and OCD    Limitations  Standing;Walking    Diagnostic tests  MRI - no acute findings    Patient Stated Goals  Improve balance, reduce falls risk    Currently in Pain?  No/denies         [facility] PT Assessment - 10/29/19 1253      Assessment   Medical Diagnosis  headaches and dizziness    Referring Provider (PT)  Micki RileyPramod S Sethi, MD    Onset Date/Surgical Date  10/01/19   date of referral; episodes began in May 2020   Prior Therapy  no      Precautions   Precautions  Fall    Precaution Comments  anemia, anxiety, HTN, cataract surgery, and OCD      Balance Screen   Has the patient fallen in the past 6 months  Yes    How many times?  2-3   inside and outside - turning to look behind her   Has the patient had a decrease in activity level because of a fear of falling?   Yes      Home Environment  Living Environment  Private residence    Living Arrangements  Alone    Type of Home  House    Home Access  Stairs to enter    Entrance Stairs-Number of Steps  5 + 1    Entrance Stairs-Rails  Right;Left    Home Layout  Two level    Alternate Level Stairs-Number of Steps  12    Alternate Level Stairs-Rails  Right    Home Equipment  [place] - single point   second hand cane from friend   Additional Comments  not driving currently      Prior Function   Level of Independence  Independent    Leisure  walking outside in the neighborhood      Cognition   Overall Cognitive Status  Impaired/Different from baseline    Area of Impairment  Memory    Memory Comments  son reports that pt has been repeating herself more lately; pt reports having more difficulty with following recipes    Memory  Impaired      Observation/Other Assessments   Focus on Therapeutic Outcomes (FOTO)   N/A      Sensation   Light Touch  Appears Intact       Coordination   Gross Motor Movements are Fluid and Coordinated  Yes    Finger Nose Finger Test  [facility]    Heel Shin Test  WFL      ROM / Strength   AROM / PROM / Strength  Strength      Strength   Overall Strength  Deficits    Overall Strength Comments  4+/5 RLE; 4-/5 LLE      Ambulation/Gait   Ambulation/Gait  Yes    Ambulation/Gait Assistance  5: Supervision    Ambulation/Gait Assistance Details  due to intermittent veering to R    Ambulation Distance (Feet)  50 Feet    Assistive device  None    Gait Pattern  Step-through pattern;Decreased step length - right;Decreased step length - left;Decreased stride length;Lateral trunk lean to right    Ambulation Surface  Level;Indoor           Vestibular Assessment - 10/29/19 1307      Vestibular Assessment   General Observation  Veering to R with ambulation; not using an AD.  Son reports progressive hearing loss.  Denies tinnitus.  Reports nausea and vomiting with first episode - dry heaves.  No changes in vision.  First episode pt had significant aura.      Symptom Behavior   Type of Dizziness   Spinning    Frequency of Dizziness  became more frequent - weekly; no episodes when on medication    Duration of Dizziness  hours    Symptom Nature  Spontaneous    Aggravating Factors  Forward bending;Turning body quickly    Relieving Factors  Slow movements;Comments   sleep   Progression of Symptoms  Better    History of similar episodes  2017 had one episode of dizziness and near syncope      Oculomotor Exam   Oculomotor Alignment  Normal    Ocular ROM  WFL    Spontaneous  Absent    Gaze-induced   Absent    Smooth Pursuits  Saccades    Saccades  Undershoots    Comment  negative test of skew      Oculomotor Exam-Fixation Suppressed    Left Head Impulse  positive    Right Head Impulse  negative  Vestibulo-Ocular Reflex   VOR to Slow Head Movement  Normal    VOR Cancellation  Normal      Positional Testing   Sidelying  Test  Sidelying Right;Sidelying Left      Sidelying Right   Sidelying Right Duration  nausea and diplopia    Sidelying Right Symptoms  Other (comment)   mild nystagmus, difficulty determining direction     Sidelying Left   Sidelying Left Duration  diplopia    Sidelying Left Symptoms  Other (comment)   mild nystagmus, difficulty determining direction         Objective measurements completed on examination: See above findings.              PT Education - 10/29/19 2053    Education Details  clinical findings, PT POC and goals, advised pt and son to follow up with referring physician regarding side effects of medication    Person(s) Educated  Patient;Child(ren)    Methods  Explanation    Comprehension  Verbalized understanding       PT Short Term Goals - 10/29/19 2105      PT SHORT TERM GOAL #1   Title  Pt will participate in further assessment of balance and vestibular system (DVA, FGA, hallpike-dix)    Time  4    Period  Weeks    Status  New    Target Date  12/28/19      PT SHORT TERM GOAL #2   Title  Pt will initiate balance and vestibular HEP that is safe for pt to perform at home without family present.    Time  4    Period  Weeks    Status  New    Target Date  12/28/19      PT SHORT TERM GOAL #3   Title  Pt will safely negotiate 5 stairs with two rails with alternating sequence MOD I    Time  4    Period  Weeks    Status  New    Target Date  12/28/19      PT SHORT TERM GOAL #4   Title  Pt will ambulate x 115' over indoor surfaces without AD, MOD I with no evidence of veering to R    Baseline  supervision with veering to R, one fall into wall    Time  4    Period  Weeks    Status  New    Target Date  12/28/19        PT Long Term Goals - 10/29/19 2109      PT LONG TERM GOAL #1   Title  Pt will be independent with final vestibular and balance HEP and will safely be able to return to walking outside without AD    Time  8    Period  Weeks     Status  New    Target Date  01/27/20      PT LONG TERM GOAL #2   Title  Pt will decrease falls risk as indicated by 4 point improvement in FGA    Baseline  TBD    Time  8    Period  Weeks    Status  New    Target Date  01/27/20      PT LONG TERM GOAL #3   Title  Pt will negotiate 12 stairs with one rail, holding item in other hand in order to safely access upstairs bedroom at home    Time  8    Period  Weeks    Status  New    Target Date  01/27/20      PT LONG TERM GOAL #4   Title  Pt will demonstrate improved use of VOR as indicated by 2-3 line difference on DVA    Baseline  TBD    Time  8    Period  Weeks    Status  New    Target Date  01/27/20      PT LONG TERM GOAL #5   Title  Pt will ambulate x 1000' outside over grass/paved surfaces MOD I with visual scanning and no veering    Time  8    Period  Weeks    Status  New    Target Date  01/27/20             Plan - 10/29/19 2055    Clinical Impression Statement  Pt is a 78 year old female referred to Neuro OPPT for evaluation of vertigo, imbalance and headaches.  Pt's PMH is significant for the following: anemia, anxiety, HTN, cataract surgery, and OCD. The following deficits were noted during pt's exam: impaired LE strength (L > RLE weakness), impaired oculomotor function, positive left head impulse test, vertigo, impaired postural control, impaired balance and abnormal gait placing patient at increased risk for falls. When assessing peripheral canals, pt had small amplitude nystagmus but unable to determine direction; pt did not report vertigo but did report diplopia and disequilibrium.  Pt currently lives alone in a two level home and is currently not using an assistive device.  Pt would benefit from skilled PT to address these impairments and functional limitations to maximize functional mobility independence and reduce falls risk.    Personal Factors and Comorbidities  Age;Comorbidity 3+;Other   lives alone    Comorbidities  anemia, anxiety, HTN, cataract surgery, and OCD    Examination-Activity Limitations  Bend;Locomotion Level;Reach Overhead;Stairs;Stand    Examination-Participation Restrictions  Community Activity;Driving;Laundry    Stability/Clinical Decision Making  Evolving/Moderate complexity    Clinical Decision Making  Moderate    Rehab Potential  Good    PT Frequency  1x / week    PT Duration  8 weeks   but cert written for 90 days due to delay in start of care   PT Treatment/Interventions  ADLs/Self Care Home Management;Canalith Repostioning;DME Instruction;Gait training;Stair training;Functional mobility training;Therapeutic activities;Therapeutic exercise;Balance training;Neuromuscular re-education;Patient/family education;Vestibular    PT Next Visit Plan  have symptoms changed since coming off Depakote?  Re-assess hallpike-dix; DVA, FGA/stairs.  Initiate HEP    Consulted and Agree with Plan of Care  Patient;Family member/caregiver    Family Member Consulted  Son       Patient will benefit from skilled therapeutic intervention in order to improve the following deficits and impairments:  Abnormal gait, Decreased balance, Decreased strength, Difficulty walking, Dizziness  Visit Diagnosis: Dizziness and giddiness  Repeated falls  Unsteadiness on feet  Other abnormalities of gait and mobility     Problem List Patient Active Problem List   Diagnosis Date Noted  . Vertigo 10/01/2019    Rico Junker, PT, DPT 10/29/19    9:19 PM    [facility] [address] [place], [place], [address] Phone: [phone]   Fax:  [phone]  Name: Amber Ruiz MRN: [id number] Date of Birth: 08/04/1941

## 2019-10-29 NOTE — Telephone Encounter (Signed)
Pt daughter(on dpr-Vaughn, Laura)has called to ask RN call her to discuss pt pt being switched to the injectable for divalproex (DEPAKOTE) 250 MG DR tablet.  Rehab states that pt is lethargic on current dosage.  Please call

## 2019-10-29 NOTE — Telephone Encounter (Signed)
I called the patient.  I talk with the daughter.  Patient is not tolerating the Depakote, it is causing slurred speech and drowsiness.  She will stop the drug.  She has been tried on Topamax, propranolol, and Depakote without tolerance or benefit with her headaches and vertigo.  I will send in a prescription for the Aimovig.

## 2019-10-29 NOTE — Telephone Encounter (Signed)
Pt's daughter Mickel Baas) called wanting to discuss alternatives for the Depakote 250 mg. She reports the pt is lethargic on the current dosage and wanted to know if another form of Depakote could be used or if an alternative could be recommended? Laura's # is [phone].

## 2019-10-30 ENCOUNTER — Telehealth: Payer: Self-pay

## 2019-10-30 NOTE — Telephone Encounter (Signed)
PA for aimovig has been initiated via telephone call through [facility] Ref # [id number]. Decision should be made in the next 24-78 hours.

## 2019-11-03 ENCOUNTER — Ambulatory Visit: Payer: Medicare Other | Admitting: Physical Therapy

## 2019-11-06 NOTE — Telephone Encounter (Addendum)
Pt's PA for Aimovig has been denied. Per insurance guidelines pt has to have 15 or more migraines per month in order to qualify for drug coverage.   I contacted the pt and spoke with her daughter Mickel Baas ok per dpr) advising of this information. I advised I would fwd a message to Dr. Jannifer Franklin to review once he returns to the office on 11/10/2019. Pt's daughter was agreeable to this.

## 2019-11-06 NOTE — Telephone Encounter (Signed)
Patient son Amber Ruiz called advising that they received a email stating that the aimovig was denied by [place]. Please follow up.

## 2019-11-07 NOTE — Telephone Encounter (Signed)
As per documentation of previous office note, the patient is having headaches and vertigo on an every other day basis, hence she is having on average 15 headache days a month, and as per her insurance she would qualify for this medication.  Please resubmit.  You may use my office note to document headache and vertigo frequency.

## 2019-11-10 NOTE — Telephone Encounter (Signed)
Patient daughter called to advise she has the numbers that were needed.   Phone# [phone] FAX# [phone]

## 2019-11-10 NOTE — Telephone Encounter (Signed)
Lvm asking pt's daughter laura to call back. Dr. Jannifer Franklin is going to try and appeal the denial for aimovig. Pt apparently received a latter with the denial information. I have not received a letter as as of yet. Needing to verify with the pt's daughter if a # was provided on the letter on who to call to initiate the appeal paper work.

## 2019-11-10 NOTE — Telephone Encounter (Addendum)
Patient daughter called back in regards to missed call. Please follow up.

## 2019-11-10 NOTE — Telephone Encounter (Signed)
I reached out to the pt's daughter and advised of the note typed in the message.

## 2019-11-11 NOTE — Telephone Encounter (Signed)
I have faxed the appeal letter to [facility] appeals department. Waiting on response.

## 2019-11-17 NOTE — Telephone Encounter (Addendum)
Appeal was denied for aimovig per medicare guidelines. Appeal sts medicare part d does not recognize headaches as a diagnosis appropriate to treat headaches. Pt has to have 15 or greater migraines documented within a 30 day period.  Will fwd to Dr. Jannifer Franklin and see how he would like to proceed.

## 2019-11-26 ENCOUNTER — Other Ambulatory Visit: Payer: Self-pay

## 2019-11-26 ENCOUNTER — Encounter: Payer: Self-pay | Admitting: Physical Therapy

## 2019-11-26 ENCOUNTER — Ambulatory Visit: Payer: Medicare Other | Attending: Neurology | Admitting: Physical Therapy

## 2019-11-26 DIAGNOSIS — R296 Repeated falls: Secondary | ICD-10-CM | POA: Insufficient documentation

## 2019-11-26 DIAGNOSIS — R42 Dizziness and giddiness: Secondary | ICD-10-CM | POA: Insufficient documentation

## 2019-11-26 DIAGNOSIS — R2681 Unsteadiness on feet: Secondary | ICD-10-CM | POA: Diagnosis present

## 2019-11-26 DIAGNOSIS — R2689 Other abnormalities of gait and mobility: Secondary | ICD-10-CM | POA: Diagnosis present

## 2019-11-26 NOTE — Therapy (Signed)
[facility] [address] [place], [place], [address] Phone: [phone]   Fax:  [phone]  Physical Therapy Treatment  Patient Details  Name: Amber Ruiz MRN: [id number] Date of Birth: [birth year] Referring Provider (PT): Micki Riley, MD   Encounter Date: 11/26/2019  PT End of Session - 11/26/19 1033    Visit Number  2    Number of Visits  9    Date for PT Re-Evaluation  01/27/20   due to delay in follow up visits - 90 days cert but 8 weeks of treatment   Authorization Type  Medicare and BCBS - 10th visit PN    PT Start Time  1030    PT Stop Time  1125    PT Time Calculation (min)  55 min    Activity Tolerance  Patient tolerated treatment well    Behavior During Therapy  [facility] for tasks assessed/performed       Past Medical History:  Diagnosis Date  . Anemia   . Anxiety   . Hiatal hernia   . OCD (obsessive compulsive disorder)     Past Surgical History:  Procedure Laterality Date  . ABDOMINAL HYSTERECTOMY    . COLONOSCOPY      There were no vitals filed for this visit.  Subjective Assessment - 11/26/19 1031    Subjective  Now taking propanolol. Hasn't noticed changes since coming off of the Depakote. Still having challenges with taste - drinking boost and eating crackers still. No more attacks of stumbling or not being able to walk a straight line. Currently dizziness is 1/10 with mild dizzy symptoms since initial evaluation but much better since. Still feeling unsteady but states no falls.    Patient is accompained by:  Family member    Pertinent History  anemia, anxiety, HTN, cataract surgery, and OCD    Limitations  Standing;Walking    Diagnostic tests  MRI - no acute findings    Patient Stated Goals  Improve balance, reduce falls risk    Currently in Pain?  No/denies             Vestibular Assessment - 11/26/19 1042      Vestibular Assessment   General Observation  Entered clinic  veering to R with ambulation, no AD.       Positional Testing   Dix-Hallpike  Dix-Hallpike Right;Dix-Hallpike Left    Horizontal Canal Testing  Horizontal Canal Right;Horizontal Canal Left      Dix-Hallpike Right   Dix-Hallpike Right Duration  >60 seconds    Dix-Hallpike Right Symptoms  Upbeat, right rotatory nystagmus   pt stated discomfort/pressure no spinning     Dix-Hallpike Left   Dix-Hallpike Left Duration  0    Dix-Hallpike Left Symptoms  No nystagmus      Horizontal Canal Right   Horizontal Canal Right Duration  3-5 seconds     Horizontal Canal Right Symptoms  Other (comment)   R rotatory      Horizontal Canal Left   Horizontal Canal Left Duration  0    Horizontal Canal Left Symptoms  Normal               [facility] Adult PT Treatment/Exercise - 11/26/19 1130      Ambulation/Gait   Ambulation/Gait  Yes    Ambulation/Gait Assistance  5: Supervision    Ambulation/Gait Assistance Details  intermittent veering to R when entering gym    Ambulation Distance (Feet)  50 Feet    Assistive device  None    Gait Pattern  Step-through pattern;Decreased step length - right;Decreased step length - left;Decreased stride length;Lateral trunk lean to right    Ambulation Surface  Indoor;Level      Therapeutic Activites    Therapeutic Activities  Other Therapeutic Activities    Other Therapeutic Activities  Pt asked for advice about medications currently taking and the medication waiting on insurance approval that has previously been denied. PT briefly discussed purpose of prescribed medication based on what she was previously told by physician. Deferred other questions about medications to prescribing physician.  Encouraged pt to write down questions and concerns about medications to discuss with physician at next visit.      Vestibular Treatment/Exercise - 11/26/19 1057      Vestibular Treatment/Exercise   Vestibular Treatment Provided  Canalith Repositioning    Canalith  Repositioning  Semont Procedure Right Posterior;Epley Manuever Right       EPLEY MANUEVER RIGHT   Number of Reps   1    Overall Response  Improved Symptoms      Semont Procedure Right Posterior   Number of Reps   1    Overall Response   No change    Response Details   small amplitude nystagmus difficult to assess direction of nystagmus with positional testing            PT Education - 11/26/19 1123    Education Details  what BPPV is, treatment that is indicated and why, rest/light activity & hydrate post peripheral canals manuvers; see TA    Person(s) Educated  Patient    Methods  Explanation    Comprehension  Verbalized understanding       PT Short Term Goals - 10/29/19 2105      PT SHORT TERM GOAL #1   Title  Pt will participate in further assessment of balance and vestibular system (DVA, FGA, hallpike-dix)    Time  4    Period  Weeks    Status  New    Target Date  12/28/19      PT SHORT TERM GOAL #2   Title  Pt will initiate balance and vestibular HEP that is safe for pt to perform at home without family present.    Time  4    Period  Weeks    Status  New    Target Date  12/28/19      PT SHORT TERM GOAL #3   Title  Pt will safely negotiate 5 stairs with two rails with alternating sequence MOD I    Time  4    Period  Weeks    Status  New    Target Date  12/28/19      PT SHORT TERM GOAL #4   Title  Pt will ambulate x 115' over indoor surfaces without AD, MOD I with no evidence of veering to R    Baseline  supervision with veering to R, one fall into wall    Time  4    Period  Weeks    Status  New    Target Date  12/28/19        PT Long Term Goals - 10/29/19 2109      PT LONG TERM GOAL #1   Title  Pt will be independent with final vestibular and balance HEP and will safely be able to return to walking outside without AD    Time  8    Period  Weeks    Status  New    Target Date  01/27/20      PT LONG TERM GOAL #2   Title  Pt will decrease falls  risk as indicated by 4 point improvement in FGA    Baseline  TBD    Time  8    Period  Weeks    Status  New    Target Date  01/27/20      PT LONG TERM GOAL #3   Title  Pt will negotiate 12 stairs with one rail, holding item in other hand in order to safely access upstairs bedroom at home    Time  8    Period  Weeks    Status  New    Target Date  01/27/20      PT LONG TERM GOAL #4   Title  Pt will demonstrate improved use of VOR as indicated by 2-3 line difference on DVA    Baseline  TBD    Time  8    Period  Weeks    Status  New    Target Date  01/27/20      PT LONG TERM GOAL #5   Title  Pt will ambulate x 1000' outside over grass/paved surfaces MOD I with visual scanning and no veering    Time  8    Period  Weeks    Status  New    Target Date  01/27/20            Plan - 11/26/19 1039    Clinical Impression Statement  PT further assessed peripheral canals - pt had small amplitude upbeating and R rotatory nystagmus and reported discomfort and pressure. Pt reported moderated symptoms when coming from supine/sidelying > sit. PT performed semont maneuver for R posterior cupulolithiasis - no change when rechecking R posterior canal > then performed R Epley. Pt tolerated tx well - will continue to assess peripheral canals as indicated. Pt may benefit from continued skilled therapy services to address deficits.    Personal Factors and Comorbidities  Age;Comorbidity 3+;Other   lives alone   Comorbidities  anemia, anxiety, HTN, cataract surgery, and OCD    Examination-Activity Limitations  Bend;Locomotion Level;Reach Overhead;Stairs;Stand    Examination-Participation Restrictions  Community Activity;Driving;Laundry    Stability/Clinical Decision Making  Evolving/Moderate complexity    Rehab Potential  Good    PT Frequency  1x / week    PT Duration  8 weeks   but cert written for 90 days due to delay in start of care   PT Treatment/Interventions  ADLs/Self Care Home  Management;Canalith Repostioning;DME Instruction;Gait training;Stair training;Functional mobility training;Therapeutic activities;Therapeutic exercise;Balance training;Neuromuscular re-education;Patient/family education;Vestibular    PT Next Visit Plan  Re-assess hallpike-dix; DVA, FGA/stairs.  Initiate HEP    Consulted and Agree with Plan of Care  Patient;Family member/caregiver    Family Member Consulted  Son       Patient will benefit from skilled therapeutic intervention in order to improve the following deficits and impairments:  Abnormal gait, Decreased balance, Decreased strength, Difficulty walking, Dizziness  Visit Diagnosis: Dizziness and giddiness  Repeated falls  Unsteadiness on feet  Other abnormalities of gait and mobility     Problem List Patient Active Problem List   Diagnosis Date Noted  . Vertigo 10/01/2019    Juliann Pulse SPT 11/26/2019, 11:31 AM  [facility] [address] [place], [place], [address] Phone: [phone]   Fax:  [phone]  Name: Monte Bronder MRN: [id number] Date of Birth: 01-05-1941

## 2019-12-02 ENCOUNTER — Encounter: Payer: Self-pay | Admitting: Neurology

## 2019-12-02 DIAGNOSIS — G43119 Migraine with aura, intractable, without status migrainosus: Secondary | ICD-10-CM

## 2019-12-02 HISTORY — DX: Migraine with aura, intractable, without status migrainosus: G43.119

## 2019-12-02 NOTE — Telephone Encounter (Signed)
The patient is being treated for chronic migraine, I will make sure that the diagnosis code matches her treatment regimen.  The diagnosis code of basilar migraine, intractable was added to the revisit from 01 October 2019.

## 2019-12-03 ENCOUNTER — Other Ambulatory Visit: Payer: Self-pay

## 2019-12-03 ENCOUNTER — Ambulatory Visit: Payer: Medicare Other | Admitting: Physical Therapy

## 2019-12-03 ENCOUNTER — Encounter: Payer: Self-pay | Admitting: Physical Therapy

## 2019-12-03 DIAGNOSIS — R296 Repeated falls: Secondary | ICD-10-CM

## 2019-12-03 DIAGNOSIS — R2689 Other abnormalities of gait and mobility: Secondary | ICD-10-CM

## 2019-12-03 DIAGNOSIS — R42 Dizziness and giddiness: Secondary | ICD-10-CM | POA: Diagnosis not present

## 2019-12-03 DIAGNOSIS — R2681 Unsteadiness on feet: Secondary | ICD-10-CM

## 2019-12-03 NOTE — Therapy (Signed)
[facility] [address] [place], [place], [address] Phone: [phone]   Fax:  [phone]  Physical Therapy Treatment  Patient Details  Name: Amber Ruiz MRN: [id number] Date of Birth: 1941-04-08 Referring Provider (PT): Micki Riley, MD   Encounter Date: 12/03/2019  PT End of Session - 12/03/19 1022    Visit Number  3    Number of Visits  9    Date for PT Re-Evaluation  01/27/20   due to delay in follow up visits - 90 days cert but 8 weeks of treatment   Authorization Type  Medicare and BCBS - 10th visit PN    PT Start Time  1019    PT Stop Time  1100    PT Time Calculation (min)  41 min    Activity Tolerance  Patient tolerated treatment well    Behavior During Therapy  [facility] for tasks assessed/performed       Past Medical History:  Diagnosis Date  . Anemia   . Anxiety   . Hiatal hernia   . 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 OCD (obsessive compulsive disorder)     Past Surgical History:  Procedure Laterality Date  . ABDOMINAL HYSTERECTOMY    . COLONOSCOPY      There were no vitals filed for this visit.  Subjective Assessment - 12/03/19 1019    Subjective  Pt reports feeling about the same - no changes. She went outside to rake up acrons and was significnatly fatigued needing to rest the rest of the day. Had a near fall but was able to recover quickly. Still has not heard updates about being denied 2x by insurance for the injectable medication - is going to talk to dr about it.    Patient is accompained by:  --    Pertinent History  anemia, anxiety, HTN, cataract surgery, and OCD    Limitations  Standing;Walking    Diagnostic tests  MRI - no acute findings    Patient Stated Goals  Improve balance, reduce falls risk    Currently in Pain?  No/denies          Vestibular Assessment - 12/03/19 1026      Positional Testing   Dix-Hallpike  Dix-Hallpike Right      Dix-Hallpike Right    Dix-Hallpike Right Duration  >60 seconds    Dix-Hallpike Right Symptoms  Upbeat, right rotatory nystagmus                Vestibular Treatment/Exercise - 12/03/19 1030      Vestibular Treatment/Exercise   Vestibular Treatment Provided  Canalith Repositioning    Canalith Repositioning  Semont Procedure Right Posterior;Epley Manuever Right       EPLEY MANUEVER RIGHT   Number of Reps   1    Overall Response  Improved Symptoms    Response Details   improved sx when coming from sidelying > sit after Semont & Epley manuever      Semont Procedure Right Posterior   Number of Reps   1   head shake x20 > vibration x20s > Semont   Overall Response   Improved Symptoms    Response Details   No nystagmus noted with f/u R dix-hallpike.             PT Education - 12/03/19 1106    Education Details  used poster picture to futher explain BPPV. pt asked about medication advice and PT further re-enforced that is a medical  decision to be decided between pt and physician, but encouraged pt to be upfront about concerns and questions she may have.    Person(s) Educated  Patient    Methods  Other (comment);Explanation   poster   Comprehension  Verbalized understanding       PT Short Term Goals - 10/29/19 2105      PT SHORT TERM GOAL #1   Title  Pt will participate in further assessment of balance and vestibular system (DVA, FGA, hallpike-dix)    Time  4    Period  Weeks    Status  New    Target Date  12/28/19      PT SHORT TERM GOAL #2   Title  Pt will initiate balance and vestibular HEP that is safe for pt to perform at home without family present.    Time  4    Period  Weeks    Status  New    Target Date  12/28/19      PT SHORT TERM GOAL #3   Title  Pt will safely negotiate 5 stairs with two rails with alternating sequence MOD I    Time  4    Period  Weeks    Status  New    Target Date  12/28/19      PT SHORT TERM GOAL #4   Title  Pt will ambulate x 115' over indoor  surfaces without AD, MOD I with no evidence of veering to R    Baseline  supervision with veering to R, one fall into wall    Time  4    Period  Weeks    Status  New    Target Date  12/28/19        PT Long Term Goals - 10/29/19 2109      PT LONG TERM GOAL #1   Title  Pt will be independent with final vestibular and balance HEP and will safely be able to return to walking outside without AD    Time  8    Period  Weeks    Status  New    Target Date  01/27/20      PT LONG TERM GOAL #2   Title  Pt will decrease falls risk as indicated by 4 point improvement in FGA    Baseline  TBD    Time  8    Period  Weeks    Status  New    Target Date  01/27/20      PT LONG TERM GOAL #3   Title  Pt will negotiate 12 stairs with one rail, holding item in other hand in order to safely access upstairs bedroom at home    Time  8    Period  Weeks    Status  New    Target Date  01/27/20      PT LONG TERM GOAL #4   Title  Pt will demonstrate improved use of VOR as indicated by 2-3 line difference on DVA    Baseline  TBD    Time  8    Period  Weeks    Status  New    Target Date  01/27/20      PT LONG TERM GOAL #5   Title  Pt will ambulate x 1000' outside over grass/paved surfaces MOD I with visual scanning and no veering    Time  8    Period  Weeks    Status  New  Target Date  01/27/20            Plan - 12/03/19 1022    Clinical Impression Statement  PT session focused on reassessment of R BPPV. Pt presented with upbeating and R rotatory nystagmus lasting >60s with peripheral canal testing indicating possible cupulolithiasis. Performed head shake > vibration > Semont maneuver which pt tolerated well. Re-checked with R dix-hallpike with no nystagmus visible > performed Epley. Pt demonstrated improvement in sx when coming from sidelying > sit and reported dec in sx. Pt may benefit from continued skilled therapy services to address deficits.    Personal Factors and Comorbidities   Age;Comorbidity 3+;Other   lives alone   Comorbidities  anemia, anxiety, HTN, cataract surgery, and OCD    Examination-Activity Limitations  Bend;Locomotion Level;Reach Overhead;Stairs;Stand    Examination-Participation Restrictions  Community Activity;Driving;Laundry    Stability/Clinical Decision Making  Evolving/Moderate complexity    Rehab Potential  Good    PT Frequency  1x / week    PT Duration  8 weeks   but cert written for 90 days due to delay in start of care   PT Treatment/Interventions  ADLs/Self Care Home Management;Canalith Repostioning;DME Instruction;Gait training;Stair training;Functional mobility training;Therapeutic activities;Therapeutic exercise;Balance training;Neuromuscular re-education;Patient/family education;Vestibular    PT Next Visit Plan  Re-assess hallpike-dix; DVA, FGA/stairs.  Initiate HEP    Consulted and Agree with Plan of Care  Patient    Family Member Consulted  --       Patient will benefit from skilled therapeutic intervention in order to improve the following deficits and impairments:  Abnormal gait, Decreased balance, Decreased strength, Difficulty walking, Dizziness  Visit Diagnosis: Dizziness and giddiness  Unsteadiness on feet  Other abnormalities of gait and mobility  Repeated falls     Problem List Patient Active Problem List   Diagnosis Date Noted  . 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 Vertigo 10/01/2019    Juliann Pulse SPT 12/03/2019, 11:07 AM  [facility] [id number] [place], [place], [address] Phone: [phone]   Fax:  [phone]  Name: Laurieanne Galloway MRN: [id number] Date of Birth: 08-28-1941

## 2019-12-03 NOTE — Telephone Encounter (Signed)
I have formulated an independent appeal letter and have placed in MD's office to sign.

## 2019-12-04 NOTE — Telephone Encounter (Signed)
Signed independent appeal letter has been signed and faxed to [facility]. Fax # [phone]. Confirmation has been received.  Hopefully we will receive the decision in the next few days.

## 2019-12-08 ENCOUNTER — Other Ambulatory Visit: Payer: Self-pay

## 2019-12-08 ENCOUNTER — Ambulatory Visit: Payer: Medicare Other | Admitting: Physical Therapy

## 2019-12-08 ENCOUNTER — Encounter: Payer: Self-pay | Admitting: Physical Therapy

## 2019-12-08 DIAGNOSIS — R42 Dizziness and giddiness: Secondary | ICD-10-CM | POA: Diagnosis not present

## 2019-12-08 DIAGNOSIS — R296 Repeated falls: Secondary | ICD-10-CM

## 2019-12-08 DIAGNOSIS — R2681 Unsteadiness on feet: Secondary | ICD-10-CM

## 2019-12-08 DIAGNOSIS — R2689 Other abnormalities of gait and mobility: Secondary | ICD-10-CM

## 2019-12-08 NOTE — Patient Instructions (Signed)
Gaze Stabilization: Sitting    Sitting in a chair, keep your shoulders relaxed.  Keeping eyes on target on wall 3 feet away, and move head side to side for _30___ seconds. Repeat while moving head up and down for __30__ seconds. Do __2-3__ sessions per day.  Try to keep your body still and keep a steady pace.  Copyright  VHI. All rights reserved.   Gaze Stabilization: Tip Card  1.Target must remain in focus, not blurry, and appear stationary while head is in motion. 2.Perform exercises with small head movements (45 to either side of midline). 3.Increase speed of head motion so long as target is in focus. 4.If you wear eyeglasses, be sure you can see target through lens (therapist will give specific instructions for bifocal / progressive lenses). 5.These exercises may provoke dizziness or nausea. Work through these symptoms. If too dizzy, slow head movement slightly. Rest between each exercise. 6.Exercises demand concentration; avoid distractions.  Copyright  VHI. All rights reserved.

## 2019-12-09 NOTE — Therapy (Signed)
[facility] [address] [place], [place], [address] Phone: [phone]   Fax:  [phone]  Physical Therapy Treatment  Patient Details  Name: Amber Ruiz MRN: [id number] Date of Birth: 04-07-1941 Referring Provider (PT): Micki Riley, MD   Encounter Date: 12/08/2019  PT End of Session - 12/09/19 1605    Visit Number  4    Number of Visits  9    Date for PT Re-Evaluation  01/27/20   due to delay in follow up visits - 90 days cert but 8 weeks of treatment   Authorization Type  Medicare and BCBS - 10th visit PN    PT Start Time  1539    PT Stop Time  1620    PT Time Calculation (min)  41 min    Activity Tolerance  Patient tolerated treatment well    Behavior During Therapy  [facility] for tasks assessed/performed       Past Medical History:  Diagnosis Date  . Anemia   . Anxiety   . Hiatal hernia   . 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 OCD (obsessive compulsive disorder)     Past Surgical History:  Procedure Laterality Date  . ABDOMINAL HYSTERECTOMY    . COLONOSCOPY      There were no vitals filed for this visit.  Subjective Assessment - 12/08/19 1542    Subjective  Doing better; still having a headache in the morning behind her eyes; Ibuprofen helps.  Still has not talked to physician about her medication questions.  Dizziness is better - can bend over now but still doesn't feel like herself.    Pertinent History  anemia, anxiety, HTN, cataract surgery, and OCD    Limitations  Standing;Walking    Diagnostic tests  MRI - no acute findings    Patient Stated Goals  Improve balance, reduce falls risk    Currently in Pain?  No/denies             Vestibular Assessment - 12/08/19 1546      Vestibular Assessment   General Observation  not veering to the R with ambulation       Symptom Behavior   Subjective history of current problem  getting fatigued quickly      Visual Acuity   Static   8    Dynamic  5      Positional Sensitivities   Sit to Supine  Lightheadedness    Supine to Left Side  No dizziness    Supine to Right Side  No dizziness    Supine to Sitting  Lightheadedness    Nose to Right Knee  No dizziness    Right Knee to Sitting  Lightedness    Nose to Left Knee  No dizziness    Left Knee to Sitting  Lightheadedness    Head Turning x 5  No dizziness    Head Nodding x 5  Lightheadedness    Pivot Right in Standing  Lightheadedness    Pivot Left in Standing  Lightheadedness    Rolling Right  No dizziness    Rolling Left  No dizziness                Vestibular Treatment/Exercise - 12/08/19 1606      Vestibular Treatment/Exercise   Vestibular Treatment Provided  Gaze    Gaze Exercises  X1 Viewing Horizontal;X1 Viewing Vertical      X1 Viewing Horizontal   Foot Position  seated without back support  Reps  2    Comments  30 seconds; no symptoms but pt has difficulty with dissociating head and body      X1 Viewing Vertical   Foot Position  seated without back support    Reps  2    Comments  30 seconds; no symptoms but pt has difficulty with dissociating head and body            PT Education - 12/09/19 1603    Education Details  Pt continues to direct her medication questions to therapist; therapist continues to recommend pt contact physician about medication and side effect questions.  Advised pt now that dizziness has improved to begin outdoor walking program with walking stick and neighbor for safety.  x1 viewing in sitting    Person(s) Educated  Patient    Methods  Explanation;Demonstration;Handout    Comprehension  Verbalized understanding;Returned demonstration       PT Short Term Goals - 10/29/19 2105      PT SHORT TERM GOAL #1   Title  Pt will participate in further assessment of balance and vestibular system (DVA, FGA, hallpike-dix)    Time  4    Period  Weeks    Status  New    Target Date  12/28/19      PT SHORT TERM  GOAL #2   Title  Pt will initiate balance and vestibular HEP that is safe for pt to perform at home without family present.    Time  4    Period  Weeks    Status  New    Target Date  12/28/19      PT SHORT TERM GOAL #3   Title  Pt will safely negotiate 5 stairs with two rails with alternating sequence MOD I    Time  4    Period  Weeks    Status  New    Target Date  12/28/19      PT SHORT TERM GOAL #4   Title  Pt will ambulate x 115' over indoor surfaces without AD, MOD I with no evidence of veering to R    Baseline  supervision with veering to R, one fall into wall    Time  4    Period  Weeks    Status  New    Target Date  12/28/19        PT Long Term Goals - 12/09/19 1613      PT LONG TERM GOAL #1   Title  Pt will be independent with final vestibular and balance HEP and will safely be able to return to walking outside without AD (01/27/2020 due date for LTG)    Time  8    Period  Weeks    Status  New      PT LONG TERM GOAL #2   Title  Pt will decrease falls risk as indicated by 4 point improvement in FGA    Baseline  TBD    Time  8    Period  Weeks    Status  New      PT LONG TERM GOAL #3   Title  Pt will negotiate 12 stairs with one rail, holding item in other hand in order to safely access upstairs bedroom at home    Time  8    Period  Weeks    Status  New      PT LONG TERM GOAL #4   Title  Pt will demonstrate improved use of  VOR as indicated by 2 line difference on DVA    Baseline  3 line difference    Time  8    Period  Weeks    Status  Revised      PT LONG TERM GOAL #5   Title  Pt will ambulate x 1000' outside over grass/paved surfaces MOD I with visual scanning and no veering    Time  8    Period  Weeks    Status  New            Plan - 12/09/19 1605    Clinical Impression Statement  Pt demonstrating improved stability during ambulation with decreased veering to R.  BPPV testing and treatment not performed today due to improvement in patient  symptoms.  Continued vestibular assessment of motion sensitivity and VOR.  Provided pt with gaze adaptation in sitting and encouraged pt to return to walking with neighbor for general endurance and aerobic conditioning.  Pt continues to be concerned about headache and "not feeling like herself"; continued to recommend pt direct medication side effect questions to prescribing physician.  Will continue to assess and progress towards LTG.    Personal Factors and Comorbidities  Age;Comorbidity 3+;Other   lives alone   Comorbidities  anemia, anxiety, HTN, cataract surgery, and OCD    Examination-Activity Limitations  Bend;Locomotion Level;Reach Overhead;Stairs;Stand    Examination-Participation Restrictions  Community Activity;Driving;Laundry    Stability/Clinical Decision Making  Evolving/Moderate complexity    Rehab Potential  Good    PT Frequency  1x / week    PT Duration  8 weeks   but cert written for 90 days due to delay in start of care   PT Treatment/Interventions  ADLs/Self Care Home Management;Canalith Repostioning;DME Instruction;Gait training;Stair training;Functional mobility training;Therapeutic activities;Therapeutic exercise;Balance training;Neuromuscular re-education;Patient/family education;Vestibular    PT Next Visit Plan  Assess FGA.  has she been walking with neighbor with walking stick?  Progress x1 viewing and add to HEP; balance on compliant surface.  Assess and treat BPPV as indicated based on pt symptoms.  Work on bending down to ground, stooping, squatting to assist pt with return to yard work/gardening.    Consulted and Agree with Plan of Care  Patient       Patient will benefit from skilled therapeutic intervention in order to improve the following deficits and impairments:  Abnormal gait, Decreased balance, Decreased strength, Difficulty walking, Dizziness  Visit Diagnosis: Dizziness and giddiness  Unsteadiness on feet  Other abnormalities of gait and  mobility  Repeated falls     Problem List Patient Active Problem List   Diagnosis Date Noted  . 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 Vertigo 10/01/2019   Dierdre Highman, PT, DPT 12/09/19    4:15 PM    [facility] [address] [place], [place], [address] Phone: [phone]   Fax:  [phone]  Name: Amber Ruiz MRN: [id number] Date of Birth: [birth year]

## 2019-12-15 ENCOUNTER — Other Ambulatory Visit: Payer: Self-pay

## 2019-12-15 ENCOUNTER — Encounter: Payer: Self-pay | Admitting: Physical Therapy

## 2019-12-15 ENCOUNTER — Ambulatory Visit: Payer: Medicare Other | Admitting: Physical Therapy

## 2019-12-15 DIAGNOSIS — R42 Dizziness and giddiness: Secondary | ICD-10-CM | POA: Diagnosis not present

## 2019-12-15 DIAGNOSIS — R2689 Other abnormalities of gait and mobility: Secondary | ICD-10-CM

## 2019-12-15 DIAGNOSIS — R2681 Unsteadiness on feet: Secondary | ICD-10-CM

## 2019-12-15 DIAGNOSIS — R296 Repeated falls: Secondary | ICD-10-CM

## 2019-12-15 NOTE — Therapy (Addendum)
[facility] [address] [place], [place], [address] Phone: [phone]   Fax:  [phone]  Physical Therapy Treatment  Patient Details  Name: Amber Ruiz MRN: [id number] Date of Birth: 26-Dec-1940 Referring Provider (PT): Micki Riley, MD   Encounter Date: 12/15/2019  PT End of Session - 12/15/19 1259    Visit Number  5    Number of Visits  9    Date for PT Re-Evaluation  01/27/20   due to delay in follow up visits - 90 days cert but 8 weeks of treatment   Authorization Type  Medicare and BCBS - 10th visit PN    PT Start Time  1105    PT Stop Time  1150    PT Time Calculation (min)  45 min    Activity Tolerance  Patient tolerated treatment well    Behavior During Therapy  [facility] for tasks assessed/performed       Past Medical History:  Diagnosis Date  . Anemia   . Anxiety   . Hiatal hernia   . 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 OCD (obsessive compulsive disorder)     Past Surgical History:  Procedure Laterality Date  . ABDOMINAL HYSTERECTOMY    . COLONOSCOPY      There were no vitals filed for this visit.  Subjective Assessment - 12/15/19 1108    Subjective  Was able to clean out a big flower bed and did not have any dizziness; still feeling very fatigued.  Feels like balance is still off.  Went for a long walk with her walking stick.  No issues.    Pertinent History  anemia, anxiety, HTN, cataract surgery, and OCD    Limitations  Standing;Walking    Diagnostic tests  MRI - no acute findings    Patient Stated Goals  Improve balance, reduce falls risk    Currently in Pain?  No/denies         [facility] PT Assessment - 12/15/19 1125      Functional Gait  Assessment   Gait assessed   Yes    Gait Level Surface  Walks 20 ft in less than 5.5 sec, no assistive devices, good speed, no evidence for imbalance, normal gait pattern, deviates no more than 6 in outside of the 12 in walkway width.     Change in Gait Speed  Able to change speed, demonstrates mild gait deviations, deviates 6-10 in outside of the 12 in walkway width, or no gait deviations, unable to achieve a major change in velocity, or uses a change in velocity, or uses an assistive device.   reports dizziness   Gait with Horizontal Head Turns  Performs head turns smoothly with slight change in gait velocity (eg, minor disruption to smooth gait path), deviates 6-10 in outside 12 in walkway width, or uses an assistive device.    Gait with Vertical Head Turns  Performs task with slight change in gait velocity (eg, minor disruption to smooth gait path), deviates 6 - 10 in outside 12 in walkway width or uses assistive device    Gait and Pivot Turn  Pivot turns safely within 3 sec and stops quickly with no loss of balance.    Step Over Obstacle  Is able to step over one shoe box (4.5 in total height) but must slow down and adjust steps to clear box safely. May require verbal cueing.    Gait with Narrow Base of Support  Ambulates less than 4  steps heel to toe or cannot perform without assistance.    Gait with Eyes Closed  Walks 20 ft, uses assistive device, slower speed, mild gait deviations, deviates 6-10 in outside 12 in walkway width. Ambulates 20 ft in less than 9 sec but greater than 7 sec.   dizziness   Ambulating Backwards  Walks 20 ft, uses assistive device, slower speed, mild gait deviations, deviates 6-10 in outside 12 in walkway width.    Steps  Two feet to a stair, must use rail.    Total Score  18    FGA comment:  18/30                    Vestibular Treatment/Exercise - 12/15/19 1130      Vestibular Treatment/Exercise   Vestibular Treatment Provided  Gaze    Gaze Exercises  X1 Viewing Horizontal;X1 Viewing Vertical      X1 Viewing Horizontal   Foot Position  seated with back support    Reps  2    Comments  started with 30 seconds and progress to 60 seconds - cues for pure head cervical rotation; pt  tends to tilt to R      X1 Viewing Vertical   Foot Position  seated with back support    Reps  2    Comments  started with 30 seconds progressed to 60 seconds - minimal symptoms            PT Education - 12/15/19 1147    Education Details  results of FGA; increased time with x1 viewing to 60 seconds still in sitting    Person(s) Educated  Patient    Methods  Explanation;Demonstration    Comprehension  Verbalized understanding;Returned demonstration       PT Short Term Goals - 10/29/19 2105      PT SHORT TERM GOAL #1   Title  Pt will participate in further assessment of balance and vestibular system (DVA, FGA, hallpike-dix)    Time  4    Period  Weeks    Status  New    Target Date  12/28/19      PT SHORT TERM GOAL #2   Title  Pt will initiate balance and vestibular HEP that is safe for pt to perform at home without family present.    Time  4    Period  Weeks    Status  New    Target Date  12/28/19      PT SHORT TERM GOAL #3   Title  Pt will safely negotiate 5 stairs with two rails with alternating sequence MOD I    Time  4    Period  Weeks    Status  New    Target Date  12/28/19      PT SHORT TERM GOAL #4   Title  Pt will ambulate x 115' over indoor surfaces without AD, MOD I with no evidence of veering to R    Baseline  supervision with veering to R, one fall into wall    Time  4    Period  Weeks    Status  New    Target Date  12/28/19        PT Long Term Goals - 12/15/19 1743      PT LONG TERM GOAL #1   Title  Pt will be independent with final vestibular and balance HEP and will safely be able to return to walking outside without AD (01/27/2020 due  date for LTG)    Time  8    Period  Weeks    Status  New      PT LONG TERM GOAL #2   Title  Pt will decrease falls risk as indicated by 4 point improvement in FGA    Baseline  18/30    Time  8    Period  Weeks    Status  New      PT LONG TERM GOAL #3   Title  Pt will negotiate 12 stairs with one rail,  holding item in other hand in order to safely access upstairs bedroom at home    Time  8    Period  Weeks    Status  New      PT LONG TERM GOAL #4   Title  Pt will demonstrate improved use of VOR as indicated by 2 line difference on DVA    Baseline  3 line difference    Time  8    Period  Weeks    Status  Revised      PT LONG TERM GOAL #5   Title  Pt will ambulate x 1000' outside over grass/paved surfaces MOD I with visual scanning and no veering    Time  8    Period  Weeks    Status  New            Plan - 12/15/19 1741    Clinical Impression Statement  Performed assessment of falls risk during gait - pt continues to have greatest imbalance and falls risk when vision is removed, stepping over obstacles, walking with narrow BOS and with vertical and horizontal head turns.  Will begin to incorporate into HEP and balance training.  Continued to review and progress x1 viewing with pt increasing time to 60 seconds today.  No reports of dizziness today.  Will continue to progress towards LTG.    Personal Factors and Comorbidities  Age;Comorbidity 3+;Other   lives alone   Comorbidities  anemia, anxiety, HTN, cataract surgery, and OCD    Examination-Activity Limitations  Bend;Locomotion Level;Reach Overhead;Stairs;Stand    Examination-Participation Restrictions  Community Activity;Driving;Laundry    Stability/Clinical Decision Making  Evolving/Moderate complexity    Rehab Potential  Good    PT Frequency  1x / week    PT Duration  8 weeks   but cert written for 90 days due to delay in start of care   PT Treatment/Interventions  ADLs/Self Care Home Management;Canalith Repostioning;DME Instruction;Gait training;Stair training;Functional mobility training;Therapeutic activities;Therapeutic exercise;Balance training;Neuromuscular re-education;Patient/family education;Vestibular    PT Next Visit Plan  Progress x1 viewing and add to HEP - eyes closed, narrow BOS, tandem, backwards, head  turns/nods; balance on compliant surface.  Assess and treat BPPV as indicated based on pt symptoms.  Work on bending down to ground, stooping, squatting to assist pt with return to yard work/gardening.    Consulted and Agree with Plan of Care  Patient       Patient will benefit from skilled therapeutic intervention in order to improve the following deficits and impairments:  Abnormal gait, Decreased balance, Decreased strength, Difficulty walking, Dizziness  Visit Diagnosis: Dizziness and giddiness  Unsteadiness on feet  Repeated falls  Other abnormalities of gait and mobility     Problem List Patient Active Problem List   Diagnosis Date Noted  . 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 Vertigo 10/01/2019    Dierdre Highman, PT, DPT 12/15/19    5:44 PM  [facility] [place] [address] [place], [place], [address] Phone: [phone]   Fax:  [phone]  Name: Salvatrice Morandi MRN: [id number] Date of Birth: 07/01/1941

## 2019-12-22 ENCOUNTER — Other Ambulatory Visit: Payer: Self-pay

## 2019-12-22 ENCOUNTER — Ambulatory Visit: Payer: Medicare Other | Attending: Neurology | Admitting: Physical Therapy

## 2019-12-22 ENCOUNTER — Encounter: Payer: Self-pay | Admitting: Physical Therapy

## 2019-12-22 DIAGNOSIS — R2689 Other abnormalities of gait and mobility: Secondary | ICD-10-CM | POA: Diagnosis present

## 2019-12-22 DIAGNOSIS — R2681 Unsteadiness on feet: Secondary | ICD-10-CM | POA: Insufficient documentation

## 2019-12-22 DIAGNOSIS — R296 Repeated falls: Secondary | ICD-10-CM | POA: Insufficient documentation

## 2019-12-22 DIAGNOSIS — R42 Dizziness and giddiness: Secondary | ICD-10-CM

## 2019-12-22 NOTE — Patient Instructions (Addendum)
Gaze Stabilization - Tip Card  1.Target must remain in focus, not blurry, and appear stationary while head is in motion. 2.Perform exercises with small head movements (45 to either side of midline). 3.Increase speed of head motion so long as target is in focus. 4.If you wear eyeglasses, be sure you can see target through lens (therapist will give specific instructions for bifocal / progressive lenses). 5.These exercises may provoke dizziness or nausea. Work through these symptoms. If too dizzy, slow head movement slightly. Rest between each exercise. 6.Exercises demand concentration; avoid distractions. 7.For safety, perform standing exercises close to a counter, wall, corner, or next to someone.  Copyright  [employer]. All rights reserved.   Gaze Stabilization - Standing Feet Apart   Feet shoulder width apart, keeping eyes on target on wall 3 feet away, tilt head down slightly and move head side to side for 30 seconds. Repeat while moving head up and down for 30 seconds. Do 2-3 sessions per day.   Feet Heel-Toe "Tandem" - HOLD TO YOUR COUNTER WITH ONE HAND    One hand on the counter - walk a straight line bringing one foot directly in front of the other - keep your eyes looking straight ahead. Repeat down and back twice (4 laps total). Do _2___ sessions per day.        Random Direction Head Motion    Walking forwards with one hand on the countertop - turn your head right to left or left to right EVERY 3 steps Repeat sequence _4 laps per session. Do __2__ sessions per day    Feet Together, Head Motion - Eyes Open    Stand in a corner where two counter tops meet.  With eyes open, feet together, move head slowly: up and down 10 times.  Right and left slowly, 10 times.  Have a chair in front for support if needed. Perform 2 times a day.

## 2019-12-22 NOTE — Therapy (Signed)
[facility] [address] [place], [place], [address] Phone: [phone]   Fax:  [phone]  Physical Therapy Treatment  Patient Details  Name: Amber Ruiz MRN: [id number] Date of Birth: 27-Dec-1940 Referring Provider (PT): Garvin Fila, MD   Encounter Date: 12/22/2019  PT End of Session - 12/22/19 1248    Visit Number  6    Number of Visits  9    Date for PT Re-Evaluation  01/27/20   due to delay in follow up visits - 90 days cert but 8 weeks of treatment   Authorization Type  Medicare and BCBS - 10th visit PN    PT Start Time  1156   arrived late   PT Stop Time  1235    PT Time Calculation (min)  39 min    Activity Tolerance  Patient tolerated treatment well    Behavior During Therapy  [facility] for tasks assessed/performed       Past Medical History:  Diagnosis Date  . Anemia   . Anxiety   . Hiatal hernia   . 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 OCD (obsessive compulsive disorder)     Past Surgical History:  Procedure Laterality Date  . ABDOMINAL HYSTERECTOMY    . COLONOSCOPY      There were no vitals filed for this visit.  Subjective Assessment - 12/22/19 1158    Subjective  Doing okay in the house; one day was standing on the porch talking with a neighbor and when she went to step down the stairs she fell to the R against the rails.  Has walked a little but alone.  Did well.    Pertinent History  anemia, anxiety, HTN, cataract surgery, and OCD    Limitations  Standing;Walking    Diagnostic tests  MRI - no acute findings    Patient Stated Goals  Improve balance, reduce falls risk    Currently in Pain?  No/denies                        Vestibular Treatment/Exercise - 12/22/19 1206      Vestibular Treatment/Exercise   Vestibular Treatment Provided  Gaze    Gaze Exercises  X1 Viewing Horizontal;X1 Viewing Vertical      X1 Viewing Horizontal   Foot Position  seated  without back support.  Standing feet apart    Reps  3    Comments  30 seconds > 60 seconds sitting > 30 seconds standing      X1 Viewing Vertical   Foot Position  seated without back support > standing with feet apart    Reps  3    Comments  30 seconds > 60 seconds sitting > 30 seconds standing         Gaze Stabilization - Tip Card  1.Target must remain in focus, not blurry, and appear stationary while head is in motion. 2.Perform exercises with small head movements (45 to either side of midline). 3.Increase speed of head motion so long as target is in focus. 4.If you wear eyeglasses, be sure you can see target through lens (therapist will give specific instructions for bifocal / progressive lenses). 5.These exercises may provoke dizziness or nausea. Work through these symptoms. If too dizzy, slow head movement slightly. Rest between each exercise. 6.Exercises demand concentration; avoid distractions. 7.For safety, perform standing exercises close to a counter, wall, corner, or next to someone.  Copyright  VHI. All rights reserved.  Gaze Stabilization - Standing Feet Apart   Feet shoulder width apart, keeping eyes on target on wall 3 feet away, tilt head down slightly and move head side to side for 30 seconds. Repeat while moving head up and down for 30 seconds. Do 2-3 sessions per day.   Feet Heel-Toe "Tandem" - HOLD TO YOUR COUNTER WITH ONE HAND    One hand on the counter - walk a straight line bringing one foot directly in front of the other - keep your eyes looking straight ahead. Repeat down and back twice (4 laps total). Do _2___ sessions per day.        Random Direction Head Motion    Walking forwards with one hand on the countertop - turn your head right to left or left to right EVERY 3 steps Repeat sequence _4 laps per session. Do __2__ sessions per day    Feet Together, Head Motion - Eyes Open    Stand in a corner where two counter tops meet.   With eyes open, feet together, move head slowly: up and down 10 times.  Right and left slowly, 10 times.  Have a chair in front for support if needed. Perform 2 times a day.                                                                PT Education - 12/22/19 1247    Education Details  progressed HEP    Person(s) Educated  Patient    Methods  Explanation;Demonstration;Handout    Comprehension  Verbalized understanding;Returned demonstration       PT Short Term Goals - 10/29/19 2105      PT SHORT TERM GOAL #1   Title  Pt will participate in further assessment of balance and vestibular system (DVA, FGA, hallpike-dix)    Time  4    Period  Weeks    Status  New    Target Date  12/28/19      PT SHORT TERM GOAL #2   Title  Pt will initiate balance and vestibular HEP that is safe for pt to perform at home without family present.    Time  4    Period  Weeks    Status  New    Target Date  12/28/19      PT SHORT TERM GOAL #3   Title  Pt will safely negotiate 5 stairs with two rails with alternating sequence MOD I    Time  4    Period  Weeks    Status  New    Target Date  12/28/19      PT SHORT TERM GOAL #4   Title  Pt will ambulate x 115' over indoor surfaces without AD, MOD I with no evidence of veering to R    Baseline  supervision with veering to R, one fall into wall    Time  4    Period  Weeks    Status  New    Target Date  12/28/19        PT Long Term Goals - 12/15/19 1743      PT LONG TERM GOAL #1   Title  Pt will be independent with final vestibular and balance HEP and will safely be able to  return to walking outside without AD (01/27/2020 due date for LTG)    Time  8    Period  Weeks    Status  New      PT LONG TERM GOAL #2   Title  Pt will decrease falls risk as indicated by 4 point improvement in FGA    Baseline  18/30    Time  8    Period  Weeks    Status  New      PT LONG TERM GOAL #3   Title   Pt will negotiate 12 stairs with one rail, holding item in other hand in order to safely access upstairs bedroom at home    Time  8    Period  Weeks    Status  New      PT LONG TERM GOAL #4   Title  Pt will demonstrate improved use of VOR as indicated by 2 line difference on DVA    Baseline  3 line difference    Time  8    Period  Weeks    Status  Revised      PT LONG TERM GOAL #5   Title  Pt will ambulate x 1000' outside over grass/paved surfaces MOD I with visual scanning and no veering    Time  8    Period  Weeks    Status  New            Plan - 12/22/19 1248    Clinical Impression Statement  Pt continues to demonstrate progress with balance and equilibrium - able to progress x 1 viewing today to standing and added further balance training to HEP to include narrow BOS, walking forwards/backwards with head turns and balance with head turns/nods.  Pt tolerated well with only very mild symptoms.  Will  continue to address and progress towards LTG.    Personal Factors and Comorbidities  Age;Comorbidity 3+;Other   lives alone   Comorbidities  anemia, anxiety, HTN, cataract surgery, and OCD    Examination-Activity Limitations  Bend;Locomotion Level;Reach Overhead;Stairs;Stand    Examination-Participation Restrictions  Community Activity;Driving;Laundry    Stability/Clinical Decision Making  Evolving/Moderate complexity    Rehab Potential  Good    PT Frequency  1x / week    PT Duration  8 weeks   but cert written for 90 days due to delay in start of care   PT Treatment/Interventions  ADLs/Self Care Home Management;Canalith Repostioning;DME Instruction;Gait training;Stair training;Functional mobility training;Therapeutic activities;Therapeutic exercise;Balance training;Neuromuscular re-education;Patient/family education;Vestibular    PT Next Visit Plan  Progress x1 viewing in standing to 60 seconds or more narrow BOS; progress corner balance to compliant surface.  Assess and treat  BPPV as indicated based on pt symptoms.  Work on bending down to ground, stooping, squatting to assist pt with return to yard work/gardening.    Consulted and Agree with Plan of Care  Patient       Patient will benefit from skilled therapeutic intervention in order to improve the following deficits and impairments:  Abnormal gait, Decreased balance, Decreased strength, Difficulty walking, Dizziness  Visit Diagnosis: Dizziness and giddiness  Unsteadiness on feet  Repeated falls  Other abnormalities of gait and mobility     Problem List Patient Active Problem List   Diagnosis Date Noted  . 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 Vertigo 10/01/2019   Dierdre Highman, PT, DPT 12/22/19    12:53 PM    [facility] [address]  [address] [place], [place], [address] Phone: [phone]   Fax:  [phone]  Name: Amber Ruiz MRN: [id number] Date of Birth: 1941-06-12

## 2019-12-23 ENCOUNTER — Telehealth: Payer: Self-pay | Admitting: *Deleted

## 2019-12-23 NOTE — Telephone Encounter (Signed)
Appeal ppw for Aimovig received from [employer] (ph: [phone], fax: [phone]). The company is requesting additional information and records. Questions answered and provided to Dr. Anne Hahn for his review. Once complete, the questionnaire and records will be faxed back to them.

## 2019-12-24 NOTE — Telephone Encounter (Signed)
Paperwork signed by MD. Arneta Cliche with receipt confirmation.

## 2019-12-29 ENCOUNTER — Ambulatory Visit: Payer: Medicare Other | Admitting: Physical Therapy

## 2019-12-29 ENCOUNTER — Other Ambulatory Visit: Payer: Self-pay

## 2019-12-29 DIAGNOSIS — R2681 Unsteadiness on feet: Secondary | ICD-10-CM

## 2019-12-29 DIAGNOSIS — R2689 Other abnormalities of gait and mobility: Secondary | ICD-10-CM

## 2019-12-29 DIAGNOSIS — R42 Dizziness and giddiness: Secondary | ICD-10-CM | POA: Diagnosis not present

## 2019-12-29 DIAGNOSIS — R296 Repeated falls: Secondary | ICD-10-CM

## 2019-12-29 NOTE — Patient Instructions (Addendum)
Tandem Stance    Holding counter with one hand: Right foot in front of left, heel touching toe both feet "straight ahead". Balance in this position __10_ seconds letting go of the counter if able.   Repeat with left foot in front of right.     Feet Together, Head Motion - Eyes Open    Stand in a corner where two counter tops meet.  With eyes open, feet together, move head slowly: up and down 10 times.  Right and left slowly, 10 times.  Have a chair in front for support if needed. Perform 2 times a day.   Gaze Stabilization: Standing Feet Together (Compliant Surface)    When you step onto the pad, have a chair for support to prevent the pad from sliding out from under you. Feet together on pillow, keeping eyes on target on wall __3__ feet away, head side to side for __60__ seconds. Repeat while moving head up and down for __60__ seconds. Do __2__ sessions per day.  Reaching / Placing Object, Diagonal Pattern    With feet apart, use two hands to pick up object down on right side and place on surface up on opposite side.  Return the cup to the floor on right.  Repeat 5 times.  Turn around and repeat to left side. Repeat __5__ times per side. Do __2__ sessions per day.

## 2019-12-29 NOTE — Therapy (Signed)
[facility] [address] [place], [place], [address] Phone: [phone]   Fax:  [phone]  Physical Therapy Treatment  Patient Details  Name: Amber Ruiz MRN: [id number] Date of Birth: [birth year] Referring Provider (PT): Garvin Fila, MD   Encounter Date: 12/29/2019  PT End of Session - 12/29/19 1309    Visit Number  7    Number of Visits  9    Date for PT Re-Evaluation  01/27/20   due to delay in follow up visits - 90 days cert but 8 weeks of treatment   Authorization Type  Medicare and BCBS - 10th visit PN    PT Start Time  1105    PT Stop Time  1150    PT Time Calculation (min)  45 min    Activity Tolerance  Patient tolerated treatment well    Behavior During Therapy  [facility] for tasks assessed/performed       Past Medical History:  Diagnosis Date  . Anemia   . Anxiety   . Hiatal hernia   . 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 OCD (obsessive compulsive disorder)     Past Surgical History:  Procedure Laterality Date  . ABDOMINAL HYSTERECTOMY    . COLONOSCOPY      There were no vitals filed for this visit.  Subjective Assessment - 12/29/19 1109    Subjective  Pt had good weekend, no LOB and falls.  No dizziness.  Has been able to walk up/down stairs and carry items without difficulty.    Pertinent History  anemia, anxiety, HTN, cataract surgery, and OCD    Limitations  Standing;Walking    Diagnostic tests  MRI - no acute findings    Patient Stated Goals  Improve balance, reduce falls risk    Currently in Pain?  No/denies                       [facility] Adult PT Treatment/Exercise - 12/29/19 1114      Ambulation/Gait   Ambulation/Gait  Yes    Ambulation/Gait Assistance  6: Modified independent (Device/Increase time)    Ambulation/Gait Assistance Details  no veering when looking around in different directions    Ambulation Distance (Feet)  230 Feet    Assistive device   None    Gait Pattern  Step-through pattern    Ambulation Surface  Level;Indoor    Stairs  Yes    Stairs Assistance  6: Modified independent (Device/Increase time)    Stair Management Technique  One rail Right;Alternating pattern;Forwards    Number of Stairs  8    Height of Stairs  6      Vestibular Treatment/Exercise - 12/29/19 1125      Vestibular Treatment/Exercise   Vestibular Treatment Provided  Gaze    Gaze Exercises  X1 Viewing Horizontal;X1 Viewing Vertical      X1 Viewing Horizontal   Foot Position  standing feet together; standing on pillow    Reps  3    Comments  30 seconds > 60 seconds; no symptoms      X1 Viewing Vertical   Foot Position  standing feet together; standing on pillow    Reps  3    Comments  30 seconds > 60 seconds; no symptoms       Tandem Stance    Holding counter with one hand: Right foot in front of left, heel touching toe both feet "straight ahead". Balance in this position __10_  seconds letting go of the counter if able.   Repeat with left foot in front of right.     Feet Together, Head Motion - Eyes Open    Stand in a corner where two counter tops meet.  With eyes open, feet together, move head slowly: up and down 10 times.  Right and left slowly, 10 times.  Have a chair in front for support if needed. Perform 2 times a day.   Gaze Stabilization: Standing Feet Together (Compliant Surface)    When you step onto the pad, have a chair for support to prevent the pad from sliding out from under you. Feet together on pillow, keeping eyes on target on wall __3__ feet away, 30moe head side to side for __60__ seconds. Repeat while moving head up and down for __60__ seconds. Do __2__ sessions per day.  Reaching / Placing Object, Diagonal Pattern    With feet apart, use two hands to pick up object down on right side and place on surface up on opposite side.  Return the cup to the floor on right.  Repeat 5 times.  Turn around and repeat to  left side. Repeat __5__ times per side. Do __2__ sessions per day.   PT Education - 12/29/19 1309    Education Details  progress towards goals; upgraded HEP    Person(s) Educated  Patient    Methods  Explanation;Demonstration;Handout    Comprehension  Verbalized understanding;Returned demonstration       PT Short Term Goals - 12/29/19 1111      PT SHORT TERM GOAL #1   Title  Pt will participate in further assessment of balance and vestibular system (DVA, FGA, hallpike-dix)    Time  4    Period  Weeks    Status  Achieved    Target Date  12/28/19      PT SHORT TERM GOAL #2   Title  Pt will initiate balance and vestibular HEP that is safe for pt to perform at home without family present.    Time  4    Period  Weeks    Status  Achieved    Target Date  12/28/19      PT SHORT TERM GOAL #3   Title  Pt will safely negotiate 5 stairs with two rails with alternating sequence MOD I    Time  4    Period  Weeks    Status  New    Target Date  12/28/19      PT SHORT TERM GOAL #4   Title  Pt will ambulate x 115' over indoor surfaces without AD, MOD I with no evidence of veering to R    Baseline  supervision with veering to R, one fall into wall    Time  4    Period  Weeks    Status  New    Target Date  12/28/19        PT Long Term Goals - 12/15/19 1743      PT LONG TERM GOAL #1   Title  Pt will be independent with final vestibular and balance HEP and will safely be able to return to walking outside without AD (01/27/2020 due date for LTG)    Time  8    Period  Weeks    Status  New      PT LONG TERM GOAL #2   Title  Pt will decrease falls risk as indicated by 4 point improvement in FGA  Baseline  18/30    Time  8    Period  Weeks    Status  New      PT LONG TERM GOAL #3   Title  Pt will negotiate 12 stairs with one rail, holding item in other hand in order to safely access upstairs bedroom at home    Time  8    Period  Weeks    Status  New      PT LONG TERM GOAL #4     Title  Pt will demonstrate improved use of VOR as indicated by 2 line difference on DVA    Baseline  3 line difference    Time  8    Period  Weeks    Status  Revised      PT LONG TERM GOAL #5   Title  Pt will ambulate x 1000' outside over grass/paved surfaces MOD I with visual scanning and no veering    Time  8    Period  Weeks    Status  New            Plan - 12/29/19 1309    Clinical Impression Statement  Pt is making excellent progress and has met all STG today.  Pt is no longer experiencing vertigo or "brain fog" and is returning to doing daily walks and working in her yard.  Pt is no longer experiencing veering to R with gait and demonstrates safe negotiation of stairs.  Due to continued progress, upgraded patient's HEP to include compliant surface and turning with reaching to the floor.  Will continue to progress and anticipate pt will be ready for D/C in two more visits.    Personal Factors and Comorbidities  Age;Comorbidity 3+;Other   lives alone   Comorbidities  anemia, anxiety, HTN, cataract surgery, and OCD    Examination-Activity Limitations  Bend;Locomotion Level;Reach Overhead;Stairs;Stand    Examination-Participation Restrictions  Community Activity;Driving;Laundry    Stability/Clinical Decision Making  Evolving/Moderate complexity    Rehab Potential  Good    PT Frequency  1x / week    PT Duration  8 weeks   but cert written for 90 days due to delay in start of care   PT Treatment/Interventions  ADLs/Self Care Home Management;Canalith Repostioning;DME Instruction;Gait training;Stair training;Functional mobility training;Therapeutic activities;Therapeutic exercise;Balance training;Neuromuscular re-education;Patient/family education;Vestibular    PT Next Visit Plan  Progress x1 viewing in standing to 60 seconds or more narrow BOS; progress corner balance to compliant surface.  Assess and treat BPPV as indicated based on pt symptoms.  Work on bending down to ground,  stooping, squatting to assist pt with return to yard work/gardening.    Consulted and Agree with Plan of Care  Patient       Patient will benefit from skilled therapeutic intervention in order to improve the following deficits and impairments:  Abnormal gait, Decreased balance, Decreased strength, Difficulty walking, Dizziness  Visit Diagnosis: Dizziness and giddiness  Unsteadiness on feet  Repeated falls  Other abnormalities of gait and mobility     Problem List Patient Active Problem List   Diagnosis Date Noted  . 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 Vertigo 10/01/2019    Rico Junker, PT, DPT 12/29/19    1:13 PM    [facility] [address] [place], [place], [address] Phone: [phone]   Fax:  [phone]  Name: Mavery Milling MRN: [id number] Date of Birth: 08-07-1941

## 2020-01-01 NOTE — Telephone Encounter (Signed)
Appeal approve for Aimovig 140mg /ml was approve according to [facility] rules.Appeal number is [facility].

## 2020-01-05 ENCOUNTER — Ambulatory Visit: Payer: Medicare Other | Admitting: Physical Therapy

## 2020-01-05 ENCOUNTER — Encounter: Payer: Self-pay | Admitting: Physical Therapy

## 2020-01-05 ENCOUNTER — Other Ambulatory Visit: Payer: Self-pay

## 2020-01-05 DIAGNOSIS — R2689 Other abnormalities of gait and mobility: Secondary | ICD-10-CM

## 2020-01-05 DIAGNOSIS — R42 Dizziness and giddiness: Secondary | ICD-10-CM

## 2020-01-05 DIAGNOSIS — R296 Repeated falls: Secondary | ICD-10-CM

## 2020-01-05 DIAGNOSIS — R2681 Unsteadiness on feet: Secondary | ICD-10-CM

## 2020-01-05 NOTE — Patient Instructions (Addendum)
    Gaze Stabilization: Standing Feet Together (Compliant Surface)    When you step onto the pad, have a chair for support to prevent the pad from sliding out from under you. Feet together on pillow, keeping eyes on target on wall __3__ feet away, move head side to side for __60__ seconds. Repeat while moving head up and down for __60__ seconds. Do __2__ sessions per day.    Reaching / Placing Object, Diagonal Pattern - STANDING ON GARDEN PAD    STANDING ON YOUR GARDEN PAD - With feet apart, use two hands to pick up object down on right side and place on surface up on opposite side.  Return the cup to the floor on right.  Repeat 8 times.  Turn around and repeat to left side. Repeat __8__ times per side. Do __2__ sessions per day.  Feet Together (Compliant Surface) Head Motion - Eyes Open    With eyes open, standing on compliant surface: __garden pad______, feet together, move head slowly: up and down 10 times and then side to side 10 times, slowly. Have a chair beside you if needed for support Do __2__ sessions per day.     Feet Together (Compliant Surface) Varied Arm Positions - Eyes Closed    Stand on compliant surface: ___garden pad_____ with feet together and one hand touching chair beside you for safety. Close eyes and visualize upright position. Hold__10-15__ seconds. Repeat _2___ times per session. Do __2__ sessions per day.  Squat STANDING ON GARDEN PAD    Standing on garden pad - Squat touch the ground with one hand, return to standing.  Repeat touching ground with other hand Repeat _10__ times. Do _2__ times a day.  Copyright  VHI. All rights reserved.

## 2020-01-05 NOTE — Therapy (Signed)
[facility] [address] [place], [place], [address] Phone: [phone]   Fax:  [phone]  Physical Therapy Treatment  Patient Details  Name: Amber Ruiz MRN: [id number] Date of Birth: May 12, 1941 Referring Provider (PT): Micki Riley, MD   Encounter Date: 01/05/2020  PT End of Session - 01/05/20 2044    Visit Number  8    Number of Visits  9    Date for PT Re-Evaluation  01/27/20   due to delay in follow up visits - 90 days cert but 8 weeks of treatment   Authorization Type  Medicare and BCBS - 10th visit PN    PT Start Time  1103    PT Stop Time  1148    PT Time Calculation (min)  45 min    Activity Tolerance  Patient tolerated treatment well    Behavior During Therapy  [facility] for tasks assessed/performed       Past Medical History:  Diagnosis Date  . Anemia   . Anxiety   . Hiatal hernia   . 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 OCD (obsessive compulsive disorder)     Past Surgical History:  Procedure Laterality Date  . ABDOMINAL HYSTERECTOMY    . COLONOSCOPY      There were no vitals filed for this visit.  Subjective Assessment - 01/05/20 1108    Subjective  No issues over the weekend - not able to get out over the weekend due to the weather - feeling a little woozy today.  Worked on a cross stitch this past weekend - lots of looking down and straining her eyes to see it.    Pertinent History  anemia, anxiety, HTN, cataract surgery, and OCD    Limitations  Standing;Walking    Diagnostic tests  MRI - no acute findings    Patient Stated Goals  Improve balance, reduce falls risk    Currently in Pain?  No/denies                       [facility] Adult PT Treatment/Exercise - 01/05/20 2051      Therapeutic Activites    Therapeutic Activities  Other Therapeutic Activities    Other Therapeutic Activities  Pt demonstrates that she typically bends down to the ground from low back  and does not utilize a squat position.  Demonstrated to patient how this may cause her to lose her balance anteriorly more easily.  Also demonstrated to pt how to utilize a squat position when reaching to the ground to counterbalance her COG and help prevent anterior LOB.        Vestibular Treatment/Exercise - 01/05/20 1123      Vestibular Treatment/Exercise   Vestibular Treatment Provided  Gaze    Gaze Exercises  X1 Viewing Horizontal;X1 Viewing Vertical      X1 Viewing Horizontal   Foot Position  standing feet together; standing on pillow    Comments  60 seconds, mild wooziness      X1 Viewing Vertical   Foot Position  standing feet together; standing on pillow    Comments  60 seconds, mild wooziness           Gaze Stabilization: Standing Feet Together (Compliant Surface)    When you step onto the pad, have a chair for support to prevent the pad from sliding out from under you. Feet together on pillow, keeping eyes on target on wall __3__ feet away, move head side  to side for __60__ seconds. Repeat while moving head up and down for __60__ seconds. Do __2__ sessions per day.    Reaching / Placing Object, Diagonal Pattern - STANDING ON GARDEN PAD    STANDING ON YOUR GARDEN PAD - With feet apart, use two hands to pick up object down on right side and place on surface up on opposite side.  Return the cup to the floor on right.  Repeat 8 times.  Turn around and repeat to left side. Repeat __8__ times per side. Do __2__ sessions per day.  Feet Together (Compliant Surface) Head Motion - Eyes Open    With eyes open, standing on compliant surface: __garden pad______, feet together, move head slowly: up and down 10 times and then side to side 10 times, slowly. Have a chair beside you if needed for support Do __2__ sessions per day.     Feet Together (Compliant Surface) Varied Arm Positions - Eyes Closed    Stand on compliant surface: ___garden pad_____ with feet together  and one hand touching chair beside you for safety. Close eyes and visualize upright position. Hold__10-15__ seconds. Repeat _2___ times per session. Do __2__ sessions per day.  Squat STANDING ON GARDEN PAD    Standing on garden pad - Squat touch the ground with one hand, return to standing.  Repeat touching ground with other hand Repeat _10__ times. Do _2__ times a day.  Copyright  VHI. All rights reserved.      PT Education - 01/05/20 2044    Education Details  see TA: updated and provided pt with final HEP; plan to check goals and D/C at next session    Person(s) Educated  Patient    Methods  Explanation;Demonstration;Handout    Comprehension  Verbalized understanding;Returned demonstration       PT Short Term Goals - 12/29/19 1111      PT SHORT TERM GOAL #1   Title  Pt will participate in further assessment of balance and vestibular system (DVA, FGA, hallpike-dix)    Time  4    Period  Weeks    Status  Achieved    Target Date  12/28/19      PT SHORT TERM GOAL #2   Title  Pt will initiate balance and vestibular HEP that is safe for pt to perform at home without family present.    Time  4    Period  Weeks    Status  Achieved    Target Date  12/28/19      PT SHORT TERM GOAL #3   Title  Pt will safely negotiate 5 stairs with two rails with alternating sequence MOD I    Time  4    Period  Weeks    Status  New    Target Date  12/28/19      PT SHORT TERM GOAL #4   Title  Pt will ambulate x 115' over indoor surfaces without AD, MOD I with no evidence of veering to R    Baseline  supervision with veering to R, one fall into wall    Time  4    Period  Weeks    Status  New    Target Date  12/28/19        PT Long Term Goals - 12/15/19 1743      PT LONG TERM GOAL #1   Title  Pt will be independent with final vestibular and balance HEP and will safely be able to return to  walking outside without AD (01/27/2020 due date for LTG)    Time  8    Period  Weeks    Status   New      PT LONG TERM GOAL #2   Title  Pt will decrease falls risk as indicated by 4 point improvement in FGA    Baseline  18/30    Time  8    Period  Weeks    Status  New      PT LONG TERM GOAL #3   Title  Pt will negotiate 12 stairs with one rail, holding item in other hand in order to safely access upstairs bedroom at home    Time  8    Period  Weeks    Status  New      PT LONG TERM GOAL #4   Title  Pt will demonstrate improved use of VOR as indicated by 2 line difference on DVA    Baseline  3 line difference    Time  8    Period  Weeks    Status  Revised      PT LONG TERM GOAL #5   Title  Pt will ambulate x 1000' outside over grass/paved surfaces MOD I with visual scanning and no veering    Time  8    Period  Weeks    Status  New            Plan - 01/05/20 2049    Clinical Impression Statement  Pt experiencing slight return of dizziness/disequilibrium today but states she did not do much activity over the weekend due to weather.  No return of "brain fog" though.  Treatment session focused on review of current HEP, updating/progressing exercises by adding in compliant surface to all exercises and providing pt with final HEP.  Plan to check goals and D/C at next visit.    Personal Factors and Comorbidities  Age;Comorbidity 3+;Other   lives alone   Comorbidities  anemia, anxiety, HTN, cataract surgery, and OCD    Examination-Activity Limitations  Bend;Locomotion Level;Reach Overhead;Stairs;Stand    Examination-Participation Restrictions  Community Activity;Driving;Laundry    Stability/Clinical Decision Making  Evolving/Moderate complexity    Rehab Potential  Good    PT Frequency  1x / week    PT Duration  8 weeks   but cert written for 90 days due to delay in start of care   PT Treatment/Interventions  ADLs/Self Care Home Management;Canalith Repostioning;DME Instruction;Gait training;Stair training;Functional mobility training;Therapeutic activities;Therapeutic  exercise;Balance training;Neuromuscular re-education;Patient/family education;Vestibular    PT Next Visit Plan  Check goals, D/C    Consulted and Agree with Plan of Care  Patient       Patient will benefit from skilled therapeutic intervention in order to improve the following deficits and impairments:  Abnormal gait, Decreased balance, Decreased strength, Difficulty walking, Dizziness  Visit Diagnosis: Dizziness and giddiness  Unsteadiness on feet  Repeated falls  Other abnormalities of gait and mobility     Problem List Patient Active Problem List   Diagnosis Date Noted  . 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 Vertigo 10/01/2019    Dierdre Highman, PT, DPT 01/05/20    8:57 PM    [facility] [address] [place], [place], [address] Phone: [phone]   Fax:  [phone]  Name: Tashauna Caisse MRN: [id number] Date of Birth: 20-Apr-1941

## 2020-01-12 ENCOUNTER — Ambulatory Visit: Payer: Medicare Other | Admitting: Physical Therapy

## 2020-01-12 ENCOUNTER — Other Ambulatory Visit: Payer: Self-pay

## 2020-01-12 ENCOUNTER — Encounter: Payer: Self-pay | Admitting: Physical Therapy

## 2020-01-12 DIAGNOSIS — R296 Repeated falls: Secondary | ICD-10-CM

## 2020-01-12 DIAGNOSIS — R42 Dizziness and giddiness: Secondary | ICD-10-CM

## 2020-01-12 DIAGNOSIS — R2689 Other abnormalities of gait and mobility: Secondary | ICD-10-CM

## 2020-01-12 DIAGNOSIS — R2681 Unsteadiness on feet: Secondary | ICD-10-CM

## 2020-01-12 NOTE — Therapy (Signed)
[facility] [address] [place], [place], [address] Phone: [phone]   Fax:  [phone]  Physical Therapy Treatment and D/C Summary  Patient Details  Name: Amber Ruiz MRN: [id number] Date of Birth: 05/16/1941 Referring Provider (PT): Garvin Fila, MD   Encounter Date: 01/12/2020  PT End of Session - 01/12/20 1139    Visit Number  9    Number of Visits  9    Date for PT Re-Evaluation  01/27/20   due to delay in follow up visits - 90 days cert but 8 weeks of treatment   Authorization Type  Medicare and BCBS - 10th visit PN    PT Start Time  1112    PT Stop Time  1145    PT Time Calculation (min)  33 min    Activity Tolerance  Patient tolerated treatment well    Behavior During Therapy  [facility] for tasks assessed/performed       Past Medical History:  Diagnosis Date  . Anemia   . Anxiety   . Hiatal hernia   . 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 OCD (obsessive compulsive disorder)     Past Surgical History:  Procedure Laterality Date  . ABDOMINAL HYSTERECTOMY    . COLONOSCOPY      There were no vitals filed for this visit.  Subjective Assessment - 01/12/20 1113    Subjective  Still having slight lightheadedness when first sitting up.  Is able to lie flat on one pillow, no longer needs 3.  Doing well overall.    Pertinent History  anemia, anxiety, HTN, cataract surgery, and OCD    Limitations  Standing;Walking    Diagnostic tests  MRI - no acute findings    Patient Stated Goals  Improve balance, reduce falls risk    Currently in Pain?  No/denies         [facility] PT Assessment - 01/12/20 1117      Assessment   Medical Diagnosis  headaches and dizziness    Referring Provider (PT)  Garvin Fila, MD    Onset Date/Surgical Date  10/01/19    Prior Therapy  no      Observation/Other Assessments   Focus on Therapeutic Outcomes (FOTO)   N/A      Ambulation/Gait   Stairs  Yes    Stairs  Assistance  6: Modified independent (Device/Increase time)    Stair Management Technique  One rail Right;Alternating pattern;Forwards    Number of Stairs  12    Height of Stairs  6    Pre-Gait Activities  stairs with one UE support and carrying an item in the other hand      Functional Gait  Assessment   Gait assessed   Yes    Gait Level Surface  Walks 20 ft in less than 7 sec but greater than 5.5 sec, uses assistive device, slower speed, mild gait deviations, or deviates 6-10 in outside of the 12 in walkway width.    Change in Gait Speed  Able to smoothly change walking speed without loss of balance or gait deviation. Deviate no more than 6 in outside of the 12 in walkway width.    Gait with Horizontal Head Turns  Performs head turns smoothly with slight change in gait velocity (eg, minor disruption to smooth gait path), deviates 6-10 in outside 12 in walkway width, or uses an assistive device.    Gait with Vertical Head Turns  Performs head turns with  no change in gait. Deviates no more than 6 in outside 12 in walkway width.    Gait and Pivot Turn  Pivot turns safely within 3 sec and stops quickly with no loss of balance.    Step Over Obstacle  Is able to step over 2 stacked shoe boxes taped together (9 in total height) without changing gait speed. No evidence of imbalance.    Gait with Narrow Base of Support  Ambulates 4-7 steps.    Gait with Eyes Closed  Walks 20 ft, uses assistive device, slower speed, mild gait deviations, deviates 6-10 in outside 12 in walkway width. Ambulates 20 ft in less than 9 sec but greater than 7 sec.    Ambulating Backwards  Walks 20 ft, uses assistive device, slower speed, mild gait deviations, deviates 6-10 in outside 12 in walkway width.    Steps  Alternating feet, must use rail.    Total Score  23    FGA comment:  23/30         Vestibular Assessment - 01/12/20 1131      Visual Acuity   Static  8    Dynamic  6           Gaze Stabilization:  Standing Feet Together (Compliant Surface)    When you step onto the pad, have a chair for support to prevent the pad from sliding out from under you. Feet together on pillow, keeping eyes on target on wall __3__ feet away, move head side to side for __60__ seconds. Repeat while moving head up and down for __60__ seconds. Do __2__ sessions per day.    Reaching / Placing Object, Diagonal Pattern - STANDING ON GARDEN PAD    STANDING ON YOUR GARDEN PAD - With feet apart, use two hands to pick up object down on right side and place on surface up on opposite side.  Return the cup to the floor on right.  Repeat 8 times.  Turn around and repeat to left side. Repeat __8__ times per side. Do __2__ sessions per day.  Feet Together (Compliant Surface) Head Motion - Eyes Open    With eyes open, standing on compliant surface: __garden pad______, feet together, move head slowly: up and down 10 times and then side to side 10 times, slowly. Have a chair beside you if needed for support Do __2__ sessions per day.     Feet Together (Compliant Surface) Varied Arm Positions - Eyes Closed    Stand on compliant surface: ___garden pad_____ with feet together and one hand touching chair beside you for safety. Close eyes and visualize upright position. Hold__10-15__ seconds. Repeat _2___ times per session. Do __2__ sessions per day.  Squat STANDING ON GARDEN PAD    Standing on garden pad - Squat touch the ground with one hand, return to standing.  Repeat touching ground with other hand Repeat _10__ times. Do _2__ times a day.  Copyright  VHI. All rights reserved.      PT Education - 01/13/20 1557    Education Details  progress towards goals, final HEP, D/C today    Person(s) Educated  Patient    Methods  Explanation    Comprehension  Verbalized understanding       PT Short Term Goals - 12/29/19 1111      PT SHORT TERM GOAL #1   Title  Pt will participate in further assessment of  balance and vestibular system (DVA, FGA, hallpike-dix)    Time  4  Period  Weeks    Status  Achieved    Target Date  12/28/19      PT SHORT TERM GOAL #2   Title  Pt will initiate balance and vestibular HEP that is safe for pt to perform at home without family present.    Time  4    Period  Weeks    Status  Achieved    Target Date  12/28/19      PT SHORT TERM GOAL #3   Title  Pt will safely negotiate 5 stairs with two rails with alternating sequence MOD I    Time  4    Period  Weeks    Status  New    Target Date  12/28/19      PT SHORT TERM GOAL #4   Title  Pt will ambulate x 115' over indoor surfaces without AD, MOD I with no evidence of veering to R    Baseline  supervision with veering to R, one fall into wall    Time  4    Period  Weeks    Status  New    Target Date  12/28/19        PT Long Term Goals - 01/12/20 1114      PT LONG TERM GOAL #1   Title  Pt will be independent with final vestibular and balance HEP and will safely be able to return to walking outside without AD (01/27/2020 due date for LTG)    Time  8    Period  Weeks    Status  Achieved      PT LONG TERM GOAL #2   Title  Pt will decrease falls risk as indicated by 4 point improvement in FGA    Baseline  18/30 > 23/30    Time  8    Period  Weeks    Status  Achieved      PT LONG TERM GOAL #3   Title  Pt will negotiate 12 stairs with one rail, holding item in other hand in order to safely access upstairs bedroom at home    Time  8    Period  Weeks    Status  Achieved      PT LONG TERM GOAL #4   Title  Pt will demonstrate improved use of VOR as indicated by 2 line difference on DVA    Baseline  --    Time  8    Period  Weeks    Status  Achieved      PT LONG TERM GOAL #5   Title  Pt will ambulate x 1000' outside over grass/paved surfaces MOD I with visual scanning and no veering    Time  8    Period  Weeks    Status  Achieved            Plan - 01/13/20 1558    Clinical Impression  Statement  Treatment session focused on assessment of progress towards LTG.  Pt has made excellent progress and has met 5/5 LTG.  Positional dizziness has resolved and pt demonstrates WNL DVA.  She also has returned to safe stair negotiation, walking outside safely and demonstrates decreased falls risk as indicated by increase in FGA score to 23/30.  Pt is independent with HEP.  Pt is safe for D/C today.    Personal Factors and Comorbidities  Age;Comorbidity 3+;Other   lives alone   Comorbidities  anemia, anxiety, HTN, cataract surgery, and OCD  Examination-Activity Limitations  Bend;Locomotion Level;Reach Overhead;Stairs;Stand    Examination-Participation Restrictions  Community Activity;Driving;Laundry    Stability/Clinical Decision Making  Evolving/Moderate complexity    Rehab Potential  Good    PT Frequency  1x / week    PT Duration  8 weeks   but cert written for 90 days due to delay in start of care   PT Treatment/Interventions  ADLs/Self Care Home Management;Canalith Repostioning;DME Instruction;Gait training;Stair training;Functional mobility training;Therapeutic activities;Therapeutic exercise;Balance training;Neuromuscular re-education;Patient/family education;Vestibular    Consulted and Agree with Plan of Care  Patient       Patient will benefit from skilled therapeutic intervention in order to improve the following deficits and impairments:  Abnormal gait, Decreased balance, Decreased strength, Difficulty walking, Dizziness  Visit Diagnosis: Dizziness and giddiness  Unsteadiness on feet  Repeated falls  Other abnormalities of gait and mobility     Problem List Patient Active Problem List   Diagnosis Date Noted  . 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 Vertigo 10/01/2019    PHYSICAL THERAPY DISCHARGE SUMMARY  Visits from Start of Care: 9  Current functional level related to goals / functional outcomes: See impression statement and LTG achievement  above   Remaining deficits: Mild lightheadedness when first rising from supine   Education / Equipment: HEP  Plan: Patient agrees to discharge.  Patient goals were met. Patient is being discharged due to meeting the stated rehab goals.  ?????     Rico Junker, PT, DPT 01/13/20    4:01 PM [facility] [address] [place], [place], [address] Phone: [phone]   Fax:  [phone]  Name: Amber Ruiz MRN: [id number] Date of Birth: [birth year]

## 2020-01-12 NOTE — Patient Instructions (Signed)
    Gaze Stabilization: Standing Feet Together (Compliant Surface)    When you step onto the pad, have a chair for support to prevent the pad from sliding out from under you. Feet together on pillow, keeping eyes on target on wall __3__ feet away, move head side to side for __60__ seconds. Repeat while moving head up and down for __60__ seconds. Do __2__ sessions per day.    Reaching / Placing Object, Diagonal Pattern - STANDING ON GARDEN PAD    STANDING ON YOUR GARDEN PAD - With feet apart, use two hands to pick up object down on right side and place on surface up on opposite side.  Return the cup to the floor on right.  Repeat 8 times.  Turn around and repeat to left side. Repeat __8__ times per side. Do __2__ sessions per day.  Feet Together (Compliant Surface) Head Motion - Eyes Open    With eyes open, standing on compliant surface: __garden pad______, feet together, move head slowly: up and down 10 times and then side to side 10 times, slowly. Have a chair beside you if needed for support Do __2__ sessions per day.     Feet Together (Compliant Surface) Varied Arm Positions - Eyes Closed    Stand on compliant surface: ___garden pad_____ with feet together and one hand touching chair beside you for safety. Close eyes and visualize upright position. Hold__10-15__ seconds. Repeat _2___ times per session. Do __2__ sessions per day.  Squat STANDING ON GARDEN PAD    Standing on garden pad - Squat touch the ground with one hand, return to standing.  Repeat touching ground with other hand Repeat _10__ times. Do _2__ times a day.  Copyright  VHI. All rights reserved.      

## 2020-02-02 ENCOUNTER — Ambulatory Visit (INDEPENDENT_AMBULATORY_CARE_PROVIDER_SITE_OTHER): Payer: Medicare Other | Admitting: Neurology

## 2020-02-02 ENCOUNTER — Other Ambulatory Visit: Payer: Self-pay

## 2020-02-02 ENCOUNTER — Encounter: Payer: Self-pay | Admitting: Neurology

## 2020-02-02 VITALS — BP 138/72 | HR 54 | Temp 97.0°F | Ht 60.0 in | Wt 130.0 lb

## 2020-02-02 DIAGNOSIS — G43119 Migraine with aura, intractable, without status migrainosus: Secondary | ICD-10-CM

## 2020-02-02 DIAGNOSIS — R42 Dizziness and giddiness: Secondary | ICD-10-CM | POA: Diagnosis not present

## 2020-02-02 MED ORDER — PROPRANOLOL HCL 20 MG PO TABS: 20.0000 mg | ORAL_TABLET | Freq: Two times a day (BID) | ORAL | 2 refills | Status: DC

## 2020-02-02 NOTE — Progress Notes (Signed)
Reason for visit: Headache, vertigo  Amber Ruiz is an 79 y.o. female  History of present illness:  Amber Ruiz is a 79 year old right-handed white female with a history of chronic vertigo.  The patient has undergone vestibular therapy with good improvement, she still has some sensation of dizziness but she has not had severe bouts of vertigo in about 3 to 4 months.  The patient has just finished her vestibular rehabilitation.  She is still not operating a motor vehicle.  She is having minimal problems with the headache, she did not go on the Aimovig.  The headaches are essentially not an issue for her currently.  She is concerned about some mild memory problems however.  Her family has noted that she is repeating himself.  The patient is living alone, her son does the finances, and her daughter keeps up with appointments.  The patient does her own medications.  She notes that if she does a lot of stooping and bending this will worsen her vertigo.  Past Medical History:  Diagnosis Date  . Anemia   . Anxiety   . Hiatal hernia   . 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 OCD (obsessive compulsive disorder)     Past Surgical History:  Procedure Laterality Date  . ABDOMINAL HYSTERECTOMY    . COLONOSCOPY      History reviewed. No pertinent family history.  Social history:  reports that she has never smoked. She has never used smokeless tobacco. She reports that she does not drink alcohol. No history on file for drug.   No Known Allergies  Medications:  Prior to Admission medications   Medication Sig Start Date End Date Taking? Authorizing Provider  alendronate (FOSAMAX) 70 MG tablet TK 1 T PO  PO Q WEEK 01/14/18  Yes [provider]  Ascorbic Acid (VITAMIN C) 100 MG tablet Take by mouth.   Yes [provider]  cetirizine (ZYRTEC) 5 MG tablet Take 5 mg by mouth daily.   Yes [provider]  citalopram (CELEXA) 40 MG tablet Take 20 mg by mouth  daily.    Yes [provider]  Erenumab-aooe (AIMOVIG) 140 MG/ML SOAJ Inject 140 mg into the skin every 30 (thirty) days. 10/29/19  Yes Kathrynn Ducking, MD  lisinopril (ZESTRIL) 10 MG tablet Take 10 mg by mouth daily.   Yes [provider]  meclizine (ANTIVERT) 25 MG tablet Take 1 tablet (25 mg total) by mouth 3 (three) times daily as needed for dizziness. 06/16/19  Yes Fredia Sorrow, MD  pantoprazole (PROTONIX) 20 MG tablet Take 20 mg by mouth daily.   Yes [provider]  propranolol (INDERAL) 20 MG tablet TAKE 2 TABLETS(40 MG) BY MOUTH TWICE DAILY 09/29/19  Yes Kathrynn Ducking, MD  divalproex (DEPAKOTE) 250 MG DR tablet  11/01/19   [provider]  topiramate (TOPAMAX) 25 MG tablet  10/01/19   [provider]    ROS:  Out of a complete 14 system review of symptoms, the patient complains only of the following symptoms, and all other reviewed systems are negative.  Memory problems Dizziness Headache  Blood pressure 138/72, pulse (!) 54, temperature (!) 97 F (36.1 C), height 5' (1.524 m), weight 130 lb (59 kg).  Physical Exam  General: The patient is alert and cooperative at the time of the examination.  Skin: No significant peripheral edema is noted.   Neurologic Exam  Mental status: The patient is alert and oriented x 3 at the  time of the examination. The Mini-Mental status examination done today shows a total score 26/30.   Cranial nerves: Facial symmetry is present. Speech is normal, no aphasia or dysarthria is noted. Extraocular movements are full. Visual fields are full.  Motor: The patient has good strength in all 4 extremities.  Sensory examination: Soft touch sensation is symmetric on the face, arms, and legs.  Coordination: The patient has good finger-nose-finger and heel-to-shin bilaterally.  Gait and station: The patient has a normal gait. Tandem gait is unsteady. Romberg is negative. No drift is seen.  Reflexes:  Deep tendon reflexes are symmetric.   Assessment/Plan:  1.  Chronic vertigo  2.  History of migraine headache  3.  Reported memory disturbance  We will follow the memory problems over time, she will follow-up here in 6 months.  She has done some better with the vertigo, she will keep up with the vestibular exercises.  She has gone down on the propranolol dose taking 20 mg twice daily.  She does report some fatigue issues, if this persists we may have to stop the propranolol in the future.  Marlan Palau MD 02/02/2020 11:46 AM  [facility] [address] [place], [place] [address]  Phone [phone] Fax [phone]

## 2020-03-03 ENCOUNTER — Other Ambulatory Visit: Payer: Self-pay | Admitting: Neurology

## 2020-03-03 MED ORDER — PROPRANOLOL HCL 20 MG PO TABS: 20.0000 mg | ORAL_TABLET | Freq: Two times a day (BID) | ORAL | 2 refills | Status: DC

## 2020-03-17 ENCOUNTER — Other Ambulatory Visit: Payer: Self-pay

## 2020-06-24 ENCOUNTER — Telehealth: Payer: Self-pay | Admitting: Neurology

## 2020-06-24 DIAGNOSIS — R42 Dizziness and giddiness: Secondary | ICD-10-CM

## 2020-06-24 NOTE — Telephone Encounter (Signed)
Amber Ruiz, Amber Ruiz(daughter on [employer]) is asking for a call with advise re: recent dizzy spells worsening and causing pt to be sick at night. Daughter is asking pt son(which is on [employer]my be called on his mobile [phone] if she is available)   Please call

## 2020-06-24 NOTE — Telephone Encounter (Signed)
I called the son.  The patient has had 2 of the last 3 nights where she is awakened with vertigo and nausea.  It is not clear whether or not she is keeping up with her vestibular exercises, if not she is to restart this.  If she does not know what to do, I will send her back for vestibular rehab.  She may take meclizine at nighttime before going to bed.  She has not tolerated this when she takes it 3 times a day.

## 2020-06-25 MED ORDER — ONDANSETRON HCL 4 MG PO TABS: 4.0000 mg | ORAL_TABLET | Freq: Three times a day (TID) | ORAL | 1 refills | Status: DC | PRN

## 2020-06-25 MED ORDER — DEXAMETHASONE 2 MG PO TABS
ORAL_TABLET | ORAL | 0 refills | Status: DC
Start: 2020-06-25 — End: 2021-11-09

## 2020-06-25 NOTE — Telephone Encounter (Signed)
I called the patient. The patient is still having ongoing nausea without vomiting, she claims that this has been going for several days, she also has a headache associated with this. She has a history of migraine.  She has also had some diarrhea issues. It is possible that the vertigo, headache, and diarrhea may be related to a migraine issue.  The patient will take meclizine 25 mg, 1/2 tablet 3 times daily. I'll call in a prescription for Zofran for nausea, I'll give her a 3-day course of Decadron.  We will set her up for vestibular rehabilitation again. This has been helpful in the past.

## 2020-06-25 NOTE — Telephone Encounter (Signed)
Pt's daughter, Shyana Kulakowski [facility]) called mother having acute dizzy spells and vomiting increase in magnitude. Pt's brother , [name] Guajardo(on [place]) on his way to Pt's home. Would like for the physician to call Pt's brother [phone].

## 2020-06-25 NOTE — Addendum Note (Signed)
Addended by: York Spaniel on: 06/25/2020 12:41 PM   Modules accepted: Orders

## 2020-06-27 NOTE — Telephone Encounter (Signed)
The family called again on 26 June 2020.  The patient had another episode of vertigo.  She is having her headaches.  She will stay the course, complete her Decadron taper, take meclizine 25 mg, 1/2 tablet 3 times daily.  She has been set up for vestibular rehab.  If any other unusual symptoms are noted such as confusion, slurred speech, focal numbness or weakness of the body or face, or syncope, the patient will need to go to the emergency room.

## 2020-06-29 NOTE — Telephone Encounter (Signed)
Pt daughter, Kyndall Amero called, Pt is having dizzy episodes and can not be left alone. Would like to know if rehabilitation has been scheduled. Would like a call from the nurse.

## 2020-06-29 NOTE — Telephone Encounter (Signed)
Noted patient's referral has been sent to [facility].

## 2020-07-13 NOTE — Telephone Encounter (Addendum)
Called patient who stated she has not heard from rehab. She continues to have dizziness. She stated it has been "debilitating", but rehab helped her before. She stated she thinks medication "sometimes makes it worse". She asked that I call daughter to discuss further. Called daughter, Vernona Rieger who spoke with Rehab. They will try to get her into [facility] office sooner. Vernona Rieger is asking if anything else can be done as they have had to call someone to help her mom on daily basis due to dizziness. I informed her Dr Anne Hahn is out of office; I'll send note to work in MD and Dr Anne Hahn. Vernona Rieger  verbalized understanding, appreciation.

## 2020-07-13 NOTE — Telephone Encounter (Signed)
Pt daughter, Thomas Rhude has called to report pt's dizzy spells are worsening.  Daughter has been given the # to rehab facility and reminded of the f/u for 09-15.  Daughter is asking if RN can call with any suggestions that can be made while pt is waiting to be seen

## 2020-07-13 NOTE — Telephone Encounter (Signed)
I called and left a message for the daughter.  I am surprised that the vestibular therapy has not yet been started.  This has helped her previously.  If the meclizine is making things worse, she can stop this.  Not sure what else to do, sometimes low-dose diazepam may be of some benefit but I think that vestibular rehab is likely be much more effective.

## 2020-07-14 ENCOUNTER — Other Ambulatory Visit: Payer: Self-pay

## 2020-07-14 ENCOUNTER — Ambulatory Visit: Payer: Medicare Other | Attending: Neurology | Admitting: Physical Therapy

## 2020-07-14 ENCOUNTER — Encounter: Payer: Self-pay | Admitting: Physical Therapy

## 2020-07-14 DIAGNOSIS — R42 Dizziness and giddiness: Secondary | ICD-10-CM

## 2020-07-14 DIAGNOSIS — R2689 Other abnormalities of gait and mobility: Secondary | ICD-10-CM | POA: Insufficient documentation

## 2020-07-14 DIAGNOSIS — Z9181 History of falling: Secondary | ICD-10-CM | POA: Diagnosis present

## 2020-07-14 DIAGNOSIS — R2681 Unsteadiness on feet: Secondary | ICD-10-CM | POA: Insufficient documentation

## 2020-07-14 NOTE — Therapy (Signed)
[facility] [address]  [address] [place], [place], [address] Phone: [phone]   Fax:  [phone]  Physical Therapy Evaluation  Patient Details  Name: Amber Ruiz MRN: [id number] Date of Birth: 1941-05-26 Referring Provider (PT): Stephanie Acre, MD   Encounter Date: 07/14/2020   PT End of Session - 07/14/20 1334    Visit Number 1    Number of Visits 13    Date for PT Re-Evaluation 08/25/20    Authorization Type Medicare & BCBS    PT Start Time 1015    PT Stop Time 1117    PT Time Calculation (min) 62 min    Activity Tolerance Patient tolerated treatment well    Behavior During Therapy [facility] for tasks assessed/performed           Past Medical History:  Diagnosis Date   Anemia    Anxiety    Hiatal hernia    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    Subjective Assessment - 07/14/20 1017    Subjective Patient reports that she has had a hx of vertigo which improved with vestibular rehab, however it has come back with a vengeance in the last 3-4 weeks. Previous bout of vertigo made her feel like she was in a kaleidoscope and could not get out of bed. Had loss of bowel and bladder at that time. However, she has not experienced any of this recently, but now, reports that her eyes are swollen and feels like she needs to lie down and go to sleep. She has not been able to participate in her hobbies d/t dizziness and is having her friend stay with her. Notes a fall 2 weeks ago when she fell backwards but denies hitting her head. Has intermittent episodes of the room spinning and nausea. Episodes last minutes. Episodes seem spontaneous and not improved with Meclizine. Denies N/T. Does endorse some L sided hearing loss since the  start of the pandemic.    Pertinent History OCD, intractable basilar artery migraine, anxiety, anemia    Limitations Lifting;Standing;Walking;House hold activities    Diagnostic tests 07/24/19 brain MRI: Mild chronic microvascular ischemic change.  None of the foci appear to be acute. No acute findings and a normal enhancement pattern.    Patient Stated Goals get rid of dizziness    Currently in Pain? No/denies              [facility] PT Assessment - 07/14/20 1026      Assessment   Medical Diagnosis Vertigo    Referring Provider (PT) Stephanie Acre, MD    Onset Date/Surgical Date 06/16/20    Next MD Visit not scheduled    Prior Therapy yes- vertigo      Balance Screen   Has the patient fallen in the past 6 months Yes    How many times? 1   fell backwards d/t dizziness 2 weeks ago   Has the patient had a decrease in activity level because of a fear of falling?  Yes    Is the patient reluctant to leave their home because of a fear of falling?  Yes      Home Environment   Living Environment Private residence    Living Arrangements Alone    Available  Help at Discharge Family    Type of Home House    Home Access Stairs to enter    Entrance Stairs-Number of Steps 5 + 1    Entrance Stairs-Rails Right;Left    Home Layout Two level    Alternate Level Stairs-Number of Steps 12    Alternate Level Stairs-Rails Right    Home Equipment Cane - single point    Additional Comments not driving currently      Prior Function   Level of Independence Independent    Vocation Retired    Pensions consultant device None    Gait Pattern Step-to pattern;Step-through pattern;Decreased step length - right;Decreased step length - left;Trunk flexed    Ambulation Surface Level;Indoor    Gait velocity decreased                  Vestibular Assessment - 07/14/20 1029      Oculomotor Exam   Oculomotor Alignment Normal    Ocular ROM WNL    Spontaneous Absent     Gaze-induced  Absent    Smooth Pursuits Intact    Saccades Undershoots    Comment B convergence insuficiency- c.o blurred vision at ~2 feet away      Oculomotor Exam-Fixation Suppressed    Left Head Impulse positive   hesitation to L head turn   Right Head Impulse negative      Vestibulo-Ocular Reflex   VOR 1 Head Only (x 1 viewing) c/o dizziness with horizontal and vertical directions, but intact    VOR Cancellation Normal   c/o severe dizziness, worse to R   Comment --      Positional Testing   Dix-Hallpike Dix-Hallpike Right    Sidelying Test Sidelying Right;Sidelying Left      Dix-Hallpike Right   Dix-Hallpike Right Duration 1 minute    Dix-Hallpike Right Symptoms Upbeat, right rotatory nystagmus      Sidelying Right   Sidelying Right Duration atleast 2.5 minutes    Sidelying Right Symptoms Upbeat, right rotatory nystagmus   c/o spinning             Objective measurements completed on examination: See above findings.        Vestibular Treatment/Exercise - 07/14/20 0001      Vestibular Treatment/Exercise   Vestibular Treatment Provided Canalith Repositioning    Canalith Repositioning Semont Procedure Right Posterior;Epley Manuever Right       EPLEY MANUEVER RIGHT   Number of Reps  1    Overall Response Improved Symptoms    Response Details  no dizziness or nystagmus upon sitting      Semont Procedure Right Posterior   Number of Reps  1    Overall Response  No change                 PT Education - 07/14/20 1334    Education Details edu on BPPV symptoms and typical course of treatment, edu on post-Epley precautions    Person(s) Educated Patient    Methods Explanation;Demonstration;Tactile cues;Verbal cues;Handout    Comprehension Verbalized understanding            PT Short Term Goals - 07/14/20 1343      PT SHORT TERM GOAL #1   Title Pt will participate in further assessment of balance and vestibular system (DVA, FGA, hallpike-dix)     Time 2    Period Weeks    Status New    Target Date 07/28/20  PT SHORT TERM GOAL #2   Title Pt will initiate balance and vestibular HEP that is safe for pt to perform at home without family present.    Time 2    Period Weeks    Status New    Target Date 07/28/20             PT Long Term Goals - 07/14/20 1343      PT LONG TERM GOAL #1   Title Pt will be independent with final vestibular and balance HEP and will safely be able to return to walking outside without AD.    Time 6    Period Weeks    Status New    Target Date 08/25/20      PT LONG TERM GOAL #2   Title Pt will decrease falls risk as indicated by DGI >/=20/24.    Time 6    Period Weeks    Status New    Target Date 08/25/20      PT LONG TERM GOAL #3   Title Pt will negotiate 12 stairs with one rail, holding item in other hand in order to safely access upstairs bedroom at home    Time 6    Period Weeks    Status New    Target Date 08/25/20      PT LONG TERM GOAL #4   Title Patient to be negative for dizziness and nystagmus with positional testing.    Time 6    Period Weeks    Status New    Target Date 08/25/20      PT LONG TERM GOAL #5   Title Patient to demonstrate TUG in <14 sec with LRAD to decrease risk of falls.    Time 6    Period Weeks    Status New    Target Date 08/25/20                  Plan - 07/14/20 1335    Clinical Impression Statement Patient is a 79y/o F presenting to OPPT with c/o dizziness of 3-4 weeks duration. Episodes feel like spinning and last minutes. Patient reports that onset seems to be spontaneous. 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 Patient reports having a fall 2 weeks ago when she fell backwards but denies hitting her head or other injuries. Patient today presenting with undershooting with saccadic testing, B  convergence insufficiency, positive L head impulse test, dizziness with vertical and horizontal VOR, and dizziness with VOR cancellation. Patient demonstrated long durations of R upbeating torsional nystagmus with R sidelying and R DH. 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 Patient without complaints at end of session and reported understanding of post-Epley instructions. Patient would benefit from skilled PT services 2x/week for 6 weeks as needed for dizziness and imbalance.    Personal Factors and Comorbidities Age;Comorbidity 3+;Past/Current Experience;Time since onset of injury/illness/exacerbation    Comorbidities OCD, intractable basilar artery migraine, anxiety, anemia    Examination-Activity Limitations Bed Mobility;Bend;Stand;Transfers;Locomotion Level;Reach Overhead    Examination-Participation Restrictions Church;Cleaning;Shop;Community Activity;Driving;Yard Work;Laundry;Meal Prep    Stability/Clinical Decision Making Stable/Uncomplicated    Clinical Decision Making Low    Rehab Potential Good    PT Frequency 2x / week    PT Duration 6 weeks    PT Treatment/Interventions ADLs/Self Care Home Management;Cryotherapy;Lobbyist  Stimulation;Moist Heat;Balance training;Therapeutic exercise;Therapeutic activities;Functional mobility training;Stair training;Gait training;Ultrasound;Neuromuscular re-education;Patient/family education;Vestibular;Manual techniques;Energy conservation;Passive range of motion    PT Next Visit Plan reassess R DH/R sidelying test    Consulted and Agree with Plan of Care Patient           Patient will benefit from skilled therapeutic intervention in order to improve the following deficits and impairments:  Postural dysfunction, Dizziness, Decreased balance, Decreased mobility, Difficulty walking, Decreased activity tolerance  Visit Diagnosis: Dizziness and giddiness  Unsteadiness on feet  History of  falling  Other abnormalities of gait and mobility     Problem List Patient Active Problem List   Diagnosis Date Noted   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

## 2020-07-19 ENCOUNTER — Other Ambulatory Visit: Payer: Self-pay

## 2020-07-19 ENCOUNTER — Encounter: Payer: Self-pay | Admitting: Physical Therapy

## 2020-07-19 ENCOUNTER — Ambulatory Visit: Payer: Medicare Other | Admitting: Physical Therapy

## 2020-07-19 DIAGNOSIS — R2681 Unsteadiness on feet: Secondary | ICD-10-CM

## 2020-07-19 DIAGNOSIS — Z9181 History of falling: Secondary | ICD-10-CM

## 2020-07-19 DIAGNOSIS — R42 Dizziness and giddiness: Secondary | ICD-10-CM

## 2020-07-19 DIAGNOSIS — R2689 Other abnormalities of gait and mobility: Secondary | ICD-10-CM

## 2020-07-19 NOTE — Therapy (Signed)
[facility] [address]  [address] [place], [place], [address] Phone: [phone]   Fax:  [phone]  Physical Therapy Treatment  Patient Details  Name: Amber Ruiz MRN: [id number] Date of Birth: Feb 02, 1941 Referring Provider (PT): Stephanie Acre, MD   Encounter Date: 07/19/2020   PT End of Session - 07/19/20 1729    Visit Number 2    Number of Visits 13    Date for PT Re-Evaluation 08/25/20    Authorization Type Medicare & BCBS    PT Start Time 1352    PT Stop Time 1454    PT Time Calculation (min) 62 min    Activity Tolerance Patient tolerated treatment well    Behavior During Therapy [facility] for tasks assessed/performed           Past Medical History:  Diagnosis Date   Anemia    Anxiety    Hiatal hernia    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   Subjective Assessment - 07/19/20 1352    Subjective Reports that she has not had any more spinning but still having the sensation of feeling tired and sleepy and like her head is too big. Having some tenderness over the L elbow from her previous fall, but none recently. Current wooziness/dizziness 5/10.    Pertinent History OCD, intractable basilar artery migraine, anxiety, anemia    Diagnostic tests 07/24/19 brain MRI: Mild chronic microvascular ischemic change.  None of the foci appear to be acute. No acute findings and a normal enhancement pattern.    Patient Stated Goals get rid of dizziness    Currently in Pain? No/denies              [facility] PT Assessment - 07/19/20 0001      Ambulation/Gait   Gait velocity decreased      Standardized Balance Assessment   Standardized Balance Assessment Timed Up and Go Test;Dynamic Gait Index      Dynamic Gait Index    Level Surface Normal    Change in Gait Speed Mild Impairment    Gait with Horizontal Head Turns Mild Impairment    Gait with Vertical Head Turns Mild Impairment    Gait and Pivot Turn Mild Impairment    Step Over Obstacle Moderate Impairment    Step Around Obstacles Normal    Steps Moderate Impairment    Total Score 16      Timed Up and Go Test   Normal TUG (seconds) 18.19    TUG Comments no AD               Vestibular Assessment - 07/19/20 0001      Positional Testing   Dix-Hallpike Dix-Hallpike Right    Sidelying Test Sidelying Right      Dix-Hallpike Right   Dix-Hallpike Right Duration 1 minute    Dix-Hallpike Right Symptoms Upbeat, right rotatory nystagmus   c/o dizziness     Sidelying Right   Sidelying Right Duration >60 sec    Sidelying Right Symptoms Upbeat, right rotatory nystagmus   dizziness                   [facility] Adult PT Treatment/Exercise - 07/19/20 0001      Ambulation/Gait   Ambulation Distance (  Feet) 180 Feet    Assistive device Straight cane    Gait Pattern Step-to pattern;Step-through pattern;Decreased step length - right;Decreased step length - left;Trunk flexed    Ambulation Surface Level;Indoor    Gait Comments listing to R; cueing for consistent SPC sequencing via VC's and TC's           Vestibular Treatment/Exercise - 07/19/20 0001       EPLEY MANUEVER RIGHT   Number of Reps  1    Overall Response Improved Symptoms    Response Details  no dizziness or nystagmus upon sitting      Semont Procedure Right Posterior   Number of Reps  1    Overall Response  Improved Symptoms    Response Details  30 sec of vibration over R mastoid prior to maneuver; no dizziness upon sitting                 PT Education - 07/19/20 1728    Education Details edu on importance of proper hydration to avoid risk of BPPV; review of post-epley precautions; edu on objective measure's and clinical relevance; advised patient to use SPC at home  with corrected height    Person(s) Educated Patient    Methods Explanation;Demonstration;Tactile cues;Verbal cues;Handout    Comprehension Verbalized understanding;Returned demonstration            PT Short Term Goals - 07/19/20 1736      PT SHORT TERM GOAL #1   Title Pt will participate in further assessment of balance and vestibular system (DVA, FGA, hallpike-dix)    Time 2    Period Weeks    Status Achieved    Target Date 07/28/20      PT SHORT TERM GOAL #2   Title Pt will initiate balance and vestibular HEP that is safe for pt to perform at home without family present.    Time 2    Period Weeks    Status On-going    Target Date 07/28/20             PT Long Term Goals - 07/19/20 1736      PT LONG TERM GOAL #1   Title Pt will be independent with final vestibular and balance HEP and will safely be able to return to walking outside without AD.    Time 6    Period Weeks    Status On-going      PT LONG TERM GOAL #2   Title Pt will decrease falls risk as indicated by DGI >/=20/24.    Time 6    Period Weeks    Status On-going      PT LONG TERM GOAL #3   Title Pt will negotiate 12 stairs with one rail, holding item in other hand in order to safely access upstairs bedroom at home    Time 6    Period Weeks    Status On-going      PT LONG TERM GOAL #4   Title Patient to be negative for dizziness and nystagmus with positional testing.    Time 6    Period Weeks    Status On-going      PT LONG TERM GOAL #5   Title Patient to demonstrate TUG in <14 sec with LRAD to decrease risk of falls.    Time 6    Period Weeks    Status On-going                 Plan - 07/19/20 1729  Clinical Impression Statement Patient reporting slight improvement in spinning sensation since last session. Also observably more steady on her feet. However, still noting tiredness and feeling like my head is too big. Assessed balance, with patients TUG and DGI scores both indicating  an increased risk of falls. Educated patient on proper SPC height and importance of consistence cane use as patients personal cane was too tall and patient not using it when walking into the clinic. Re-assessed R sidelying test, which was positive for R upbeating torsional nystagmus. Treated patient with R Semont with vibration over the R mastoid as well as R Epley. Patient without notable nystagmus upon sitting. Educated patient on importance of maintaining proper hydration and maintenance of post-Epley precautions to avoid recurrence of BPPV. Patient reported understanding and without complaints at end of session.    Comorbidities OCD, intractable basilar artery migraine, anxiety, anemia    PT Treatment/Interventions ADLs/Self Care Home Management;Cryotherapy;Electrical Stimulation;Moist Heat;Balance training;Therapeutic exercise;Therapeutic activities;Functional mobility training;Stair training;Gait training;Ultrasound;Neuromuscular re-education;Patient/family education;Vestibular;Manual techniques;Energy conservation;Passive range of motion    PT Next Visit Plan reassess R DH/R sidelying test    Consulted and Agree with Plan of Care Patient           Patient will benefit from skilled therapeutic intervention in order to improve the following deficits and impairments:  Postural dysfunction, Dizziness, Decreased balance, Decreased mobility, Difficulty walking, Decreased activity tolerance  Visit Diagnosis: Dizziness and giddiness  Unsteadiness on feet  History of falling  Other abnormalities of gait and mobility     Problem List Patient Active Problem List   Diagnosis Date Noted   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  [address] [place], [place], [address] Phone: [phone]   Fax:  [phone]  Name: Amber Ruiz MRN: [id number] Date of Birth: [birth year]

## 2020-07-21 ENCOUNTER — Encounter: Payer: Self-pay | Admitting: Physical Therapy

## 2020-07-21 ENCOUNTER — Other Ambulatory Visit: Payer: Self-pay

## 2020-07-21 ENCOUNTER — Ambulatory Visit: Payer: Medicare Other | Attending: Neurology | Admitting: Physical Therapy

## 2020-07-21 DIAGNOSIS — Z9181 History of falling: Secondary | ICD-10-CM | POA: Insufficient documentation

## 2020-07-21 DIAGNOSIS — R2681 Unsteadiness on feet: Secondary | ICD-10-CM | POA: Diagnosis present

## 2020-07-21 DIAGNOSIS — R2689 Other abnormalities of gait and mobility: Secondary | ICD-10-CM | POA: Insufficient documentation

## 2020-07-21 DIAGNOSIS — R42 Dizziness and giddiness: Secondary | ICD-10-CM | POA: Diagnosis present

## 2020-07-21 NOTE — Therapy (Signed)
[facility] [address]  [address] [place], [place], [address] Phone: [phone]   Fax:  [phone]  Physical Therapy Treatment  Patient Details  Name: Amber Ruiz MRN: [id number] Date of Birth: Jul 09, 1941 Referring Provider (PT): Stephanie Acre, MD   Encounter Date: 07/21/2020   PT End of Session - 07/21/20 1716    Visit Number 3    Number of Visits 13    Date for PT Re-Evaluation 08/25/20    Authorization Type Medicare & BCBS    PT Start Time 1443    PT Stop Time 1530    PT Time Calculation (min) 47 min    Activity Tolerance Patient tolerated treatment well    Behavior During Therapy [facility] for tasks assessed/performed           Past Medical History:  Diagnosis Date  . Anemia   . Anxiety   . Hiatal hernia   . 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 OCD (obsessive compulsive disorder)     Past Surgical History:  Procedure Laterality Date  . ABDOMINAL HYSTERECTOMY    . COLONOSCOPY      There were no vitals filed for this visit.   Subjective Assessment - 07/21/20 1443    Subjective Feels a little better since last session. Has tried to adjust her sleeping position according to instructions given. Head does not "feel clear yet." Has not gotten her cane yet. Planning to go to a hair dresser but will try to have positional modifications made for her.    Pertinent History OCD, intractable basilar artery migraine, anxiety, anemia    Diagnostic tests 07/24/19 brain MRI: Mild chronic microvascular ischemic change.  None of the foci appear to be acute. No acute findings and a normal enhancement pattern.    Patient Stated Goals get rid of dizziness    Currently in Pain? No/denies                   Vestibular Assessment - 07/21/20 0001      Positional Testing   Horizontal Canal Testing Horizontal Canal Right;Horizontal Canal Left      Dix-Hallpike Right   Dix-Hallpike Right Duration >60 sec    x2 reps   Dix-Hallpike Right Symptoms Upbeat, right rotatory nystagmus      Sidelying Right   Sidelying Right Duration 60 sec    Sidelying Right Symptoms Upbeat, right rotatory nystagmus   c/o 5/10 dizziness; edu on Austin Miles in this position     Horizontal Canal Right   Horizontal Canal Right Duration 15 sec    Horizontal Canal Right Symptoms Nystagmus   R upbeating torsional nystagmus & dizziness     Horizontal Canal Left   Horizontal Canal Left Duration 0    Horizontal Canal Left Symptoms Normal                     Vestibular Treatment/Exercise - 07/21/20 0001       EPLEY MANUEVER RIGHT   Number of Reps  2    Overall Response Improved Symptoms    Response Details  5 sec of indiscernable nystagmus upon sitting; 20 sec on 2nd rep                 PT Education - 07/21/20 1714    Education Details update to HEP    Person(s) Educated Patient    Methods Explanation;Demonstration;Tactile cues;Verbal cues;Handout    Comprehension Verbalized understanding;Returned demonstration  PT Short Term Goals - 07/19/20 1736      PT SHORT TERM GOAL #1   Title Pt will participate in further assessment of balance and vestibular system (DVA, FGA, hallpike-dix)    Time 2    Period Weeks    Status Achieved    Target Date 07/28/20      PT SHORT TERM GOAL #2   Title Pt will initiate balance and vestibular HEP that is safe for pt to perform at home without family present.    Time 2    Period Weeks    Status On-going    Target Date 07/28/20             PT Long Term Goals - 07/19/20 1736      PT LONG TERM GOAL #1   Title Pt will be independent with final vestibular and balance HEP and will safely be able to return to walking outside without AD.    Time 6    Period Weeks    Status On-going      PT LONG TERM GOAL #2   Title Pt will decrease falls risk as indicated by DGI >/=20/24.    Time 6    Period Weeks    Status On-going      PT LONG TERM  GOAL #3   Title Pt will negotiate 12 stairs with one rail, holding item in other hand in order to safely access upstairs bedroom at home    Time 6    Period Weeks    Status On-going      PT LONG TERM GOAL #4   Title Patient to be negative for dizziness and nystagmus with positional testing.    Time 6    Period Weeks    Status On-going      PT LONG TERM GOAL #5   Title Patient to demonstrate TUG in <14 sec with LRAD to decrease risk of falls.    Time 6    Period Weeks    Status On-going                 Plan - 07/21/20 1717    Clinical Impression Statement Patient arrived to session with report of continued improvement in dizziness, but still with some remaining. Notes compliance with post-Epley precautions but has not yet gotten a SPC. Had patient positioned in supine to assess Roll test. Patient demonstrated indiscernible torsional nystagmus upon laying supine. This dissipated, and patient demonstrated R up beating torsional nystagmus with R roll test. Proceeded with R Epley, which patient tolerated well, however demonstrated short duration of indiscernible nystagmus upon sitting. Durations of nystagmus seemed to be shorter lasting than at initial appointment, thus cupulolithiasis seemed less likely. Re-tested with R sidelying test, which was positive for demonstrated R up beating torsional nystagmus. Proceeded with another trial of R Epley, with patient demonstrating same response upon sitting. Patient was educated on Austin Miles to address motion sensitivity at home with supervision for safety. Patient reported understanding and was able to ambulate out of session without issue. Plan to return to Semont maneuver at next session as nystagmus duration seemed to increase with subsequent positional testing.    Comorbidities OCD, intractable basilar artery migraine, anxiety, anemia    PT Treatment/Interventions ADLs/Self Care Home Management;Cryotherapy;Electrical Stimulation;Moist  Heat;Balance training;Therapeutic exercise;Therapeutic activities;Functional mobility training;Stair training;Gait training;Ultrasound;Neuromuscular re-education;Patient/family education;Vestibular;Manual techniques;Energy conservation;Passive range of motion    PT Next Visit Plan reassess R DH/R sidelying test    Consulted and Agree with Plan  of Care Patient           Patient will benefit from skilled therapeutic intervention in order to improve the following deficits and impairments:  Postural dysfunction, Dizziness, Decreased balance, Decreased mobility, Difficulty walking, Decreased activity tolerance  Visit Diagnosis: Dizziness and giddiness  Unsteadiness on feet  History of falling  Other abnormalities of gait and mobility     Problem List Patient Active Problem List   Diagnosis Date Noted  . 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 Vertigo 10/01/2019     Anette Guarneri, PT, DPT 07/21/20 5:28 PM   [facility] [address]  [address] [place], [place], [address] Phone: [phone]   Fax:  [phone]  Name: Amber Ruiz MRN: [id number] Date of Birth: 06/23/1941

## 2020-07-28 ENCOUNTER — Encounter: Payer: Medicare Other | Admitting: Physical Therapy

## 2020-07-30 ENCOUNTER — Ambulatory Visit: Payer: Medicare Other | Admitting: Physical Therapy

## 2020-07-30 ENCOUNTER — Other Ambulatory Visit: Payer: Self-pay

## 2020-07-30 ENCOUNTER — Encounter: Payer: Self-pay | Admitting: Physical Therapy

## 2020-07-30 DIAGNOSIS — Z9181 History of falling: Secondary | ICD-10-CM

## 2020-07-30 DIAGNOSIS — R2689 Other abnormalities of gait and mobility: Secondary | ICD-10-CM

## 2020-07-30 DIAGNOSIS — R2681 Unsteadiness on feet: Secondary | ICD-10-CM

## 2020-07-30 DIAGNOSIS — R42 Dizziness and giddiness: Secondary | ICD-10-CM

## 2020-07-30 NOTE — Therapy (Signed)
[facility] [address]  [address] [place], [place], [address] Phone: [phone]   Fax:  [phone]  Physical Therapy Treatment  Patient Details  Name: Amber Ruiz MRN: [id number] Date of Birth: 05-15-1941 Referring Provider (PT): Stephanie Acre, MD   Encounter Date: 07/30/2020   PT End of Session - 07/30/20 1205    Visit Number 4    Number of Visits 13    Date for PT Re-Evaluation 08/25/20    Authorization Type Medicare & BCBS    PT Start Time 0922    PT Stop Time 1016    PT Time Calculation (min) 54 min    Activity Tolerance Patient tolerated treatment well    Behavior During Therapy [facility] for tasks assessed/performed           Past Medical History:  Diagnosis Date  . Anemia   . Anxiety   . Hiatal hernia   . 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 OCD (obsessive compulsive disorder)     Past Surgical History:  Procedure Laterality Date  . ABDOMINAL HYSTERECTOMY    . COLONOSCOPY      There were no vitals filed for this visit.   Subjective Assessment - 07/30/20 0923    Subjective Feeling better than last session. Was able to pick up some sticks from her backyard, but still feeling "rough" when she wakes up. Unable to perform her HEP d/t fear of performing them alone. 5/10 dizziness currently at rest.    Pertinent History OCD, intractable basilar artery migraine, anxiety, anemia    Diagnostic tests 07/24/19 brain MRI: Mild chronic microvascular ischemic change.  None of the foci appear to be acute. No acute findings and a normal enhancement pattern.    Patient Stated Goals get rid of dizziness    Currently in Pain? No/denies                   Vestibular Assessment - 07/30/20 0001      Dix-Hallpike Right   Dix-Hallpike Right Duration >60 sec    Dix-Hallpike Right Symptoms Upbeat, right rotatory nystagmus                     Vestibular Treatment/Exercise - 07/30/20 0001        Vestibular Treatment/Exercise   Vestibular Treatment Provided Habituation;Canalith Repositioning    Habituation Exercises Brandt Daroff       EPLEY MANUEVER RIGHT   Number of Reps  1    Overall Response No change    Response Details  10 sec of L upbeating torsional nystagmus upon sitting      Semont Procedure Right Posterior   Number of Reps  2    Overall Response  Improved Symptoms    Response Details  30 sec of vibration over R mastoid prior to maneuver; no dizziness upon sitting      Austin Miles   Number of Reps  1   each side   Symptom Description  R upbeating torsional nystagmus towards R ear, L upbeating torsional nystagmus to L ear   >60 sec each                PT Education - 07/30/20 1203    Education Details advised patient to try returning to normal sleeping position to assess for bed mobility tolerance; advised not to perform Austin Miles unless patient is supervised    Person(s) Educated Patient    Methods Explanation;Demonstration;Tactile cues;Verbal cues  Comprehension Verbalized understanding            PT Short Term Goals - 07/19/20 1736      PT SHORT TERM GOAL #1   Title Pt will participate in further assessment of balance and vestibular system (DVA, FGA, hallpike-dix)    Time 2    Period Weeks    Status Achieved    Target Date 07/28/20      PT SHORT TERM GOAL #2   Title Pt will initiate balance and vestibular HEP that is safe for pt to perform at home without family present.    Time 2    Period Weeks    Status On-going    Target Date 07/28/20             PT Long Term Goals - 07/19/20 1736      PT LONG TERM GOAL #1   Title Pt will be independent with final vestibular and balance HEP and will safely be able to return to walking outside without AD.    Time 6    Period Weeks    Status On-going      PT LONG TERM GOAL #2   Title Pt will decrease falls risk as indicated by DGI >/=20/24.    Time 6    Period Weeks    Status  On-going      PT LONG TERM GOAL #3   Title Pt will negotiate 12 stairs with one rail, holding item in other hand in order to safely access upstairs bedroom at home    Time 6    Period Weeks    Status On-going      PT LONG TERM GOAL #4   Title Patient to be negative for dizziness and nystagmus with positional testing.    Time 6    Period Weeks    Status On-going      PT LONG TERM GOAL #5   Title Patient to demonstrate TUG in <14 sec with LRAD to decrease risk of falls.    Time 6    Period Weeks    Status On-going                 Plan - 07/30/20 1205    Clinical Impression Statement Patient today reporting feeling better than last session. Was able to pick up some sticks from her backyard as well as crochet, but still feeling "rough" when she wakes up. Unable to perform her HEP d/t fear of performing them alone. Reviewed Austin Miles to B sides with patient reporting dizziness and demonstrating small amplitude R upbeating torsional nystagmus towards R ear and L upbeating torsional nystagmus to L ear, with response to R being stronger. Performed R Semont maneuver with vibration over R mastoid. Patient tolerated this well. Proceeded with R Epley. Patient with 10 sec of reversal of R torsional nystagmus upon sitting. Performed R Semont with vibration once more, with patient again tolerating this well. Unable to perform another round of R Epley d/t time constraints. Patient is demonstrating good improvement in functional activity tolerance despite continued symptoms with positional testing.    Comorbidities OCD, intractable basilar artery migraine, anxiety, anemia    PT Treatment/Interventions ADLs/Self Care Home Management;Cryotherapy;Electrical Stimulation;Moist Heat;Balance training;Therapeutic exercise;Therapeutic activities;Functional mobility training;Stair training;Gait training;Ultrasound;Neuromuscular re-education;Patient/family education;Vestibular;Manual techniques;Energy  conservation;Passive range of motion    PT Next Visit Plan reassess R DH/R sidelying test    Consulted and Agree with Plan of Care Patient  Patient will benefit from skilled therapeutic intervention in order to improve the following deficits and impairments:  Postural dysfunction, Dizziness, Decreased balance, Decreased mobility, Difficulty walking, Decreased activity tolerance  Visit Diagnosis: Dizziness and giddiness  Unsteadiness on feet  History of falling  Other abnormalities of gait and mobility     Problem List Patient Active Problem List   Diagnosis Date Noted  . 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 Vertigo 10/01/2019     Anette Guarneri, PT, DPT 07/30/20 12:15 PM   [facility] [address]  [address] [place], [place], [address] Phone: [phone]   Fax:  [phone]  Name: Amber Ruiz MRN: [id number] Date of Birth: 1941/01/20

## 2020-08-03 ENCOUNTER — Encounter: Payer: Self-pay | Admitting: Physical Therapy

## 2020-08-03 ENCOUNTER — Ambulatory Visit: Payer: Medicare Other | Admitting: Physical Therapy

## 2020-08-03 ENCOUNTER — Other Ambulatory Visit: Payer: Self-pay

## 2020-08-03 DIAGNOSIS — R2681 Unsteadiness on feet: Secondary | ICD-10-CM

## 2020-08-03 DIAGNOSIS — R2689 Other abnormalities of gait and mobility: Secondary | ICD-10-CM

## 2020-08-03 DIAGNOSIS — R42 Dizziness and giddiness: Secondary | ICD-10-CM

## 2020-08-03 DIAGNOSIS — Z9181 History of falling: Secondary | ICD-10-CM

## 2020-08-03 NOTE — Therapy (Signed)
[facility] [address]  [place], [place], [address] Phone: [phone]   Fax:  [phone]  Physical Therapy Treatment  Patient Details  Name: Amber Ruiz MRN: [id number] Date of Birth: 1941-10-28 Referring Provider (PT): Margette Fast, MD   Encounter Date: 08/03/2020   PT End of Session - 08/03/20 1707    Visit Number 5    Number of Visits 13    Date for PT Re-Evaluation 08/25/20    Authorization Type Medicare & BCBS    PT Start Time 3149    PT Stop Time 1700    PT Time Calculation (min) 45 min    Activity Tolerance Patient tolerated treatment well    Behavior During Therapy [facility] for tasks assessed/performed           Past Medical History:  Diagnosis Date  . Anemia   . Anxiety   . Hiatal hernia   . 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 OCD (obsessive compulsive disorder)     Past Surgical History:  Procedure Laterality Date  . ABDOMINAL HYSTERECTOMY    . COLONOSCOPY      There were no vitals filed for this visit.   Subjective Assessment - 08/03/20 1615    Subjective Reports that she is feeling a little better. Was able to weed-eat yesterday. Notes that she feels that her dizziness sometimes comes on spontaneously without aggravating movement or changes in head postion.    Pertinent History OCD, intractable basilar artery migraine, anxiety, anemia    Diagnostic tests 07/24/19 brain MRI: Mild chronic microvascular ischemic change.  None of the foci appear to be acute. No acute findings and a normal enhancement pattern.    Patient Stated Goals get rid of dizziness    Currently in Pain? No/denies                   Vestibular Assessment - 08/03/20 0001      Positional Testing   Dix-Hallpike Dix-Hallpike Right    Sidelying Test Sidelying Right      Dix-Hallpike Right   Dix-Hallpike Right Duration >60 sec    Dix-Hallpike Right Symptoms Upbeat, right rotatory nystagmus        Sidelying Right   Sidelying Right Duration --   non-fatiguing    Sidelying Right Symptoms Upbeat, right rotatory nystagmus                     Vestibular Treatment/Exercise - 08/03/20 0001      Vestibular Treatment/Exercise   Gaze Exercises X1 Viewing Horizontal;X1 Viewing Vertical       EPLEY MANUEVER RIGHT   Number of Reps  1    Overall Response No change    Response Details  ~5 sec of dizziness without observable nystamus upon sitting      Semont Procedure Right Posterior   Number of Reps  1    Overall Response  Improved Symptoms    Response Details  20 sec head shake>30 sec of vibration over R mastoid prior to maneuver; no dizziness upon sitting      [employer]   Number of Reps  --    Symptom Description  --      X1 Viewing Horizontal   Foot Position sitting, standing    Comments 2x10 sitting; 2x10 standing   no dizziness; slight difficulty fixating with L head turn     X1 Viewing Vertical   Foot Position standing  Comments 2x10 standing   c/o 4-5/10 dizziness                PT Education - 08/03/20 1706    Education Details update to HEP; review of post-Epley precautions, advised patient to bring [facility] with her when out in the yard to avois falls    Person(s) Educated Patient    Methods Explanation;Demonstration;Tactile cues;Verbal cues;Handout    Comprehension Returned demonstration;Verbalized understanding            PT Short Term Goals - 08/03/20 1713      PT SHORT TERM GOAL #1   Title Pt will participate in further assessment of balance and vestibular system (DVA, FGA, hallpike-dix)    Time 2    Period Weeks    Status Achieved    Target Date 07/28/20      PT SHORT TERM GOAL #2   Title Pt will initiate balance and vestibular HEP that is safe for pt to perform at home without family present.    Time 2    Period Weeks    Status Partially Met   administered HEP today   Target Date 07/28/20             PT Long Term Goals -  07/19/20 1736      PT LONG TERM GOAL #1   Title Pt will be independent with final vestibular and balance HEP and will safely be able to return to walking outside without AD.    Time 6    Period Weeks    Status On-going      PT LONG TERM GOAL #2   Title Pt will decrease falls risk as indicated by DGI >/=20/24.    Time 6    Period Weeks    Status On-going      PT LONG TERM GOAL #3   Title Pt will negotiate 12 stairs with one rail, holding item in other hand in order to safely access upstairs bedroom at home    Time 6    Period Weeks    Status On-going      PT LONG TERM GOAL #4   Title Patient to be negative for dizziness and nystagmus with positional testing.    Time 6    Period Weeks    Status On-going      PT LONG TERM GOAL #5   Title Patient to demonstrate TUG in <14 sec with LRAD to decrease risk of falls.    Time 6    Period Weeks    Status On-going                 Plan - 08/03/20 1707    Clinical Impression Statement Patient reporting that she is feeling a little better and was able to weed-eat in her yard yesterday. Notes that she feels that her dizziness sometimes comes on spontaneously without aggravating movement or changes in head position. Reassessed tolerance for VOR, with patient noting no dizziness in horizontal direction, but with 4-5/10 dizziness in vertical direction. Patient demonstrated upbeating R torsional nystagmus with R sidelying test. Proceeded with R Semont maneuver with head shake and vibration over the R mastoid preceding this maneuver. Patient then tolerated R Epley with very short duration of dizziness upon sitting, which quickly dissipated. Updated HEP with VOR exercise as this was well-tolerated today. Also advised patient to bring her [facility] with her when working in the yard to avoid falls. Patient reported understanding and without complaints at end of session.  Comorbidities OCD, intractable basilar artery migraine, anxiety, anemia    PT  Treatment/Interventions ADLs/Self Care Home Management;Cryotherapy;Electrical Stimulation;Moist Heat;Balance training;Therapeutic exercise;Therapeutic activities;Functional mobility training;Stair training;Gait training;Ultrasound;Neuromuscular re-education;Patient/family education;Vestibular;Manual techniques;Energy conservation;Passive range of motion    PT Next Visit Plan reassess R DH/R sidelying test    Consulted and Agree with Plan of Care Patient           Patient will benefit from skilled therapeutic intervention in order to improve the following deficits and impairments:  Postural dysfunction, Dizziness, Decreased balance, Decreased mobility, Difficulty walking, Decreased activity tolerance  Visit Diagnosis: Dizziness and giddiness  Unsteadiness on feet  History of falling  Other abnormalities of gait and mobility     Problem List Patient Active Problem List   Diagnosis Date Noted  . 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 Vertigo 10/01/2019     Janene Harvey, PT, DPT 08/03/20 5:15 PM   [facility] [address]  [place], [place], [address] Phone: [phone]   Fax:  [phone]  Name: Amber Ruiz MRN: [id number] Date of Birth: [birth year]

## 2020-08-04 ENCOUNTER — Encounter: Payer: Self-pay | Admitting: Neurology

## 2020-08-04 ENCOUNTER — Ambulatory Visit (INDEPENDENT_AMBULATORY_CARE_PROVIDER_SITE_OTHER): Payer: Medicare Other | Admitting: Neurology

## 2020-08-04 VITALS — BP 123/78 | HR 76 | Ht 60.0 in | Wt 134.0 lb

## 2020-08-04 DIAGNOSIS — G43119 Migraine with aura, intractable, without status migrainosus: Secondary | ICD-10-CM

## 2020-08-04 DIAGNOSIS — R42 Dizziness and giddiness: Secondary | ICD-10-CM

## 2020-08-04 MED ORDER — EMGALITY 120 MG/ML ~~LOC~~ SOAJ: 240.0000 mg | SUBCUTANEOUS | 0 refills | Status: DC

## 2020-08-04 MED ORDER — EMGALITY 120 MG/ML ~~LOC~~ SOAJ: 120.0000 mg | SUBCUTANEOUS | 11 refills | Status: DC

## 2020-08-04 NOTE — Progress Notes (Signed)
PATIENT: Amber Ruiz DOB: [birth year]  REASON FOR VISIT: follow up HISTORY FROM: patient  HISTORY OF PRESENT ILLNESS: Today 08/04/20  Amber Ruiz is a 79 year old female with history of chronic vertigo, history of migraine.  Has previously completed vestibular rehab.  In early August, had severe vertigo with nausea, and diarrhea.  Placed on meclizine 25 mg 1/2 tablet 3 times a day, Zofran, 3-day course of Decadron. Sent for vestibular rehab. Aimovig ordered, never started. Tapered off Topamax due to decreased appetite, couldn't tolerate Depakote or propranolol.  Has been in vestibular rehab since 8/25, feels 80% better, has started staying alone again, but only downstairs due to feel of falling.  Has been able to do some work in her garden, use a weed eater.  Meclizine was not helpful, had terrible side effects. Not currently dizzy, but head doesn't feel clear.  Not driving.  No headache per se.  Is otherwise healthy person, was very independent before onset of vertigo in May 2020. Her PT messaged she is demonstrating subjective improvement, but has consistent nystagmus and symptoms of BPPV on testing. I called and talked with her son, Amber Ruiz. She is here alone, her friend drove her.   HISTORY 02/02/2020 Dr. Anne Hahn: Amber Ruiz is a 79 year old right-handed white female with a history of chronic vertigo.  The patient has undergone vestibular therapy with good improvement, she still has some sensation of dizziness but she has not had severe bouts of vertigo in about 3 to 4 months.  The patient has just finished her vestibular rehabilitation.  She is still not operating a motor vehicle.  She is having minimal problems with the headache, she did not go on the Aimovig.  The headaches are essentially not an issue for her currently.  She is concerned about some mild memory problems however.  Her family has noted that she is repeating himself.  The patient is living alone, her son does the finances, and her  daughter keeps up with appointments.  The patient does her own medications.  She notes that if she does a lot of stooping and bending this will worsen her vertigo.   REVIEW OF SYSTEMS: Out of a complete 14 system review of symptoms, the patient complains only of the following symptoms, and all other reviewed systems are negative.  Dizziness  ALLERGIES: No Known Allergies  HOME MEDICATIONS: Outpatient Medications Prior to Visit  Medication Sig Dispense Refill  . alendronate (FOSAMAX) 70 MG tablet TK 1 T PO  PO Q WEEK    . Ascorbic Acid (VITAMIN C) 100 MG tablet Take by mouth.    . cetirizine (ZYRTEC) 5 MG tablet Take 5 mg by mouth daily.    . citalopram (CELEXA) 40 MG tablet Take 20 mg by mouth daily.     Marland Kitchen dexamethasone (DECADRON) 2 MG tablet Take 3 tablets the first day, 2 the second and 1 the third day 6 tablet 0  . lisinopril (ZESTRIL) 10 MG tablet Take 10 mg by mouth daily.    . pantoprazole (PROTONIX) 20 MG tablet Take 20 mg by mouth daily.    Dorise Hiss (AIMOVIG) 140 MG/ML SOAJ Inject 140 mg into the skin every 30 (thirty) days. (Patient not taking: Reported on 02/02/2020) 1.12 mg 4  . meclizine (ANTIVERT) 25 MG tablet Take 1 tablet (25 mg total) by mouth 3 (three) times daily as needed for dizziness. 30 tablet 0  . ondansetron (ZOFRAN) 4 MG tablet Take 1 tablet (4 mg total) by mouth every 8 (  eight) hours as needed for nausea or vomiting. 20 tablet 1  . propranolol (INDERAL) 20 MG tablet Take 1 tablet (20 mg total) by mouth 2 (two) times daily. 180 tablet 2   No facility-administered medications prior to visit.    PAST MEDICAL HISTORY: Past Medical History:  Diagnosis Date  . Anemia   . Anxiety   . Hiatal hernia   . 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 OCD (obsessive compulsive disorder)     PAST SURGICAL HISTORY: Past Surgical History:  Procedure Laterality Date  . ABDOMINAL HYSTERECTOMY    . COLONOSCOPY      FAMILY HISTORY: No family history on  file.  SOCIAL HISTORY: Social History   Socioeconomic History  . Marital status: Married    Spouse name: Not on file  . Number of children: Not on file  . Years of education: Not on file  . Highest education level: Not on file  Occupational History  . Not on file  Tobacco Use  . Smoking status: Never Smoker  . Smokeless tobacco: Never Used  Substance and Sexual Activity  . Alcohol use: No  . Drug use: Not on file  . Sexual activity: Not on file  Other Topics Concern  . Not on file  Social History Narrative  . Not on file   Social Determinants of Health   Financial Resource Strain:   . Difficulty of Paying Living Expenses: Not on file  Food Insecurity:   . Worried About Programme researcher, broadcasting/film/video in the Last Year: Not on file  . Ran Out of Food in the Last Year: Not on file  Transportation Needs:   . Lack of Transportation (Medical): Not on file  . Lack of Transportation (Non-Medical): Not on file  Physical Activity:   . Days of Exercise per Week: Not on file  . Minutes of Exercise per Session: Not on file  Stress:   . Feeling of Stress : Not on file  Social Connections:   . Frequency of Communication with Friends and Family: Not on file  . Frequency of Social Gatherings with Friends and Family: Not on file  . Attends Religious Services: Not on file  . Active Member of Clubs or Organizations: Not on file  . Attends Banker Meetings: Not on file  . Marital Status: Not on file  Intimate Partner Violence:   . Fear of Current or Ex-Partner: Not on file  . Emotionally Abused: Not on file  . Physically Abused: Not on file  . Sexually Abused: Not on file   PHYSICAL EXAM  Vitals:   08/04/20 1340  BP: 123/78  Pulse: 76  Weight: 134 lb (60.8 kg)  Height: 5' (1.524 m)   Body mass index is 26.17 kg/m.  Generalized: Well developed, in no acute distress   Neurological examination  Mentation: Alert oriented to time, place, history taking. Follows all commands  speech and language fluent Cranial nerve II-XII: Pupils were equal round reactive to light. Extraocular movements were full, visual field were full on confrontational test. Facial sensation and strength were normal.  Head turning and shoulder shrug  were normal and symmetric. Motor: The motor testing reveals 5 over 5 strength of all 4 extremities. Good symmetric motor tone is noted throughout.  Sensory: Sensory testing is intact to soft touch on all 4 extremities. No evidence of extinction is noted.  Coordination: Cerebellar testing reveals good finger-nose-finger and heel-to-shin bilaterally.  Gait and station: Gait is normal. Romberg is negative.  No drift is seen.  Reflexes: Deep tendon reflexes are symmetric and normal bilaterally.   DIAGNOSTIC DATA (LABS, IMAGING, TESTING) - I reviewed patient records, labs, notes, testing and imaging myself where available.  Lab Results  Component Value Date   WBC 7.4 06/16/2019   HGB 15.3 (H) 06/16/2019   HCT 48.9 (H) 06/16/2019   MCV 87.6 06/16/2019   PLT 253 06/16/2019      Component Value Date/Time   NA 139 06/16/2019 1624   K 4.3 06/16/2019 1624   CL 102 06/16/2019 1624   CO2 28 06/16/2019 1624   GLUCOSE 112 (H) 06/16/2019 1624   BUN 16 06/16/2019 1624   CREATININE 0.70 06/16/2019 1624   CALCIUM 9.3 06/16/2019 1624   PROT 6.9 06/16/2019 1624   ALBUMIN 4.0 06/16/2019 1624   AST 19 06/16/2019 1624   ALT 16 06/16/2019 1624   ALKPHOS 90 06/16/2019 1624   BILITOT 0.4 06/16/2019 1624   GFRNONAA >60 06/16/2019 1624   GFRAA >60 06/16/2019 1624   No results found for: CHOL, HDL, LDLCALC, LDLDIRECT, TRIG, CHOLHDL No results found for: QJFH5K Lab Results  Component Value Date   VITAMINB12 452 06/30/2019   No results found for: TSH  ASSESSMENT AND PLAN 79 y.o. year old female  has a past medical history of Anemia, Anxiety, Hiatal hernia, Intractable basilar artery migraine (12/02/2019), and OCD (obsessive compulsive disorder). here  with:  1.  Chronic vertigo 2.  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  This included previsit chart review, lab review, study review, order entry, electronic health record documentation, patient education.  Margie Ege, AGNP-C, DNP 08/04/2020, 2:33 PM [facility] [address] [place], [place] [address] [phone]

## 2020-08-04 NOTE — Progress Notes (Signed)
I have read the note, and I agree with the clinical assessment and plan.  Dickie Cloe K Orena Cavazos   

## 2020-08-04 NOTE — Patient Instructions (Signed)
Let's start Emgality for migraine prevention  Starting 240 mg monthly injection x 1 Followed 30 days by maintenance dosing of 120 mg monthly injection  You will stay on 120 mg dosing every month for migraine prevention Continue your vestibular rehab See you back in 6 months   Galcanezumab injection What is this medicine? GALCANEZUMAB (gal ka NEZ ue mab) is used to prevent migraines and treat cluster headaches. This medicine may be used for other purposes; ask your health care provider or pharmacist if you have questions. COMMON BRAND NAME(S): Emgality What should I tell my health care provider before I take this medicine? They need to know if you have any of these conditions:  an unusual or allergic reaction to galcanezumab, other medicines, foods, dyes, or preservatives  pregnant or trying to get pregnant  breast-feeding How should I use this medicine? This medicine is for injection under the skin. You will be taught how to prepare and give this medicine. Use exactly as directed. Take your medicine at regular intervals. Do not take your medicine more often than directed. It is important that you put your used needles and syringes in a special sharps container. Do not put them in a trash can. If you do not have a sharps container, call your pharmacist or healthcare provider to get one. Talk to your pediatrician regarding the use of this medicine in children. Special care may be needed. Overdosage: If you think you have taken too much of this medicine contact a poison control center or emergency room at once. NOTE: This medicine is only for you. Do not share this medicine with others. What if I miss a dose? If you miss a dose, take it as soon as you can. If it is almost time for your next dose, take only that dose. Do not take double or extra doses. What may interact with this medicine? Interactions are not expected. This list may not describe all possible interactions. Give your health  care provider a list of all the medicines, herbs, non-prescription drugs, or dietary supplements you use. Also tell them if you smoke, drink alcohol, or use illegal drugs. Some items may interact with your medicine. What should I watch for while using this medicine? Tell your doctor or healthcare professional if your symptoms do not start to get better or if they get worse. What side effects may I notice from receiving this medicine? Side effects that you should report to your doctor or health care professional as soon as possible:  allergic reactions like skin rash, itching or hives, swelling of the face, lips, or tongue Side effects that usually do not require medical attention (report these to your doctor or health care professional if they continue or are bothersome):  pain, redness, or irritation at site where injected This list may not describe all possible side effects. Call your doctor for medical advice about side effects. You may report side effects to FDA at 1-800-FDA-1088. Where should I keep my medicine? Keep out of the reach of children. You will be instructed on how to store this medicine. Throw away any unused medicine after the expiration date on the label. NOTE: This sheet is a summary. It may not cover all possible information. If you have questions about this medicine, talk to your doctor, pharmacist, or health care provider.  2020 Elsevier/Gold Standard (2018-04-24 12:03:23)

## 2020-08-05 ENCOUNTER — Ambulatory Visit: Payer: Medicare Other | Admitting: Physical Therapy

## 2020-08-05 ENCOUNTER — Encounter: Payer: Self-pay | Admitting: Physical Therapy

## 2020-08-05 ENCOUNTER — Other Ambulatory Visit: Payer: Self-pay

## 2020-08-05 DIAGNOSIS — Z9181 History of falling: Secondary | ICD-10-CM

## 2020-08-05 DIAGNOSIS — R2681 Unsteadiness on feet: Secondary | ICD-10-CM

## 2020-08-05 DIAGNOSIS — R42 Dizziness and giddiness: Secondary | ICD-10-CM | POA: Diagnosis not present

## 2020-08-05 DIAGNOSIS — R2689 Other abnormalities of gait and mobility: Secondary | ICD-10-CM

## 2020-08-05 NOTE — Therapy (Signed)
[facility] [address]  [address] [place], [place], [address] Phone: [phone]   Fax:  [phone]  Physical Therapy Treatment  Patient Details  Name: Amber Ruiz MRN: [id number] Date of Birth: 06-08-1941 Referring Provider (PT): Stephanie Acre, MD   Encounter Date: 08/05/2020   PT End of Session - 08/05/20 1157    Visit Number 6    Number of Visits 13    Date for PT Re-Evaluation 08/25/20    Authorization Type Medicare & BCBS    PT Start Time 1103    PT Stop Time 1156    PT Time Calculation (min) 53 min    Equipment Utilized During Treatment Gait belt    Activity Tolerance Patient tolerated treatment well    Behavior During Therapy WFL for tasks assessed/performed           Past Medical History:  Diagnosis Date  . Anemia   . Anxiety   . Hiatal hernia   . 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 OCD (obsessive compulsive disorder)     Past Surgical History:  Procedure Laterality Date  . ABDOMINAL HYSTERECTOMY    . COLONOSCOPY      There were no vitals filed for this visit.   Subjective Assessment - 08/05/20 1104    Subjective Was seen by Neurology yesterday- was given a new medication for migraines. Notes that everything else was fine. Reports no issues with recent HEP.    Pertinent History OCD, intractable basilar artery migraine, anxiety, anemia    Diagnostic tests 07/24/19 brain MRI: Mild chronic microvascular ischemic change.  None of the foci appear to be acute. No acute findings and a normal enhancement pattern.    Patient Stated Goals get rid of dizziness    Currently in Pain? No/denies                   Vestibular Assessment - 08/05/20 0001      Positional Testing   Sidelying Test Sidelying Right      Sidelying Right   Sidelying Right Duration >60 sec    Sidelying Right Symptoms Upbeat, right rotatory nystagmus   slightly larger amplitude; 8-9/10 dizziness                     Vestibular Treatment/Exercise - 08/05/20 0001       EPLEY MANUEVER RIGHT   Number of Reps  1    Overall Response No change    Response Details  ~10 sec of dizziness without observable nystamus upon sitting      Semont Procedure Right Posterior   Number of Reps  1    Overall Response  Improved Symptoms    Response Details  20 sec head shake>30 sec of vibration over R mastoid prior to maneuver; no dizziness upon sitting                 PT Education - 08/05/20 1156    Education Details advised to use SPC when dizziness is more severe, advised to continue with HEP    Person(s) Educated Patient    Methods Explanation;Demonstration;Tactile cues;Handout;Verbal cues    Comprehension Verbalized understanding;Returned demonstration            PT Short Term Goals - 08/05/20 1202      PT SHORT TERM GOAL #1   Title Pt will participate in further assessment of balance and vestibular system (DVA, FGA, hallpike-dix)    Time 2  Period Weeks    Status Achieved    Target Date 07/28/20      PT SHORT TERM GOAL #2   Title Pt will initiate balance and vestibular HEP that is safe for pt to perform at home without family present.    Time 2    Period Weeks    Status Achieved    Target Date 07/28/20             PT Long Term Goals - 07/19/20 1736      PT LONG TERM GOAL #1   Title Pt will be independent with final vestibular and balance HEP and will safely be able to return to walking outside without AD.    Time 6    Period Weeks    Status On-going      PT LONG TERM GOAL #2   Title Pt will decrease falls risk as indicated by DGI >/=20/24.    Time 6    Period Weeks    Status On-going      PT LONG TERM GOAL #3   Title Pt will negotiate 12 stairs with one rail, holding item in other hand in order to safely access upstairs bedroom at home    Time 6    Period Weeks    Status On-going      PT LONG TERM GOAL #4   Title Patient to be negative for  dizziness and nystagmus with positional testing.    Time 6    Period Weeks    Status On-going      PT LONG TERM GOAL #5   Title Patient to demonstrate TUG in <14 sec with LRAD to decrease risk of falls.    Time 6    Period Weeks    Status On-going                 Plan - 08/05/20 1157    Clinical Impression Statement Patient reporting that she was seen by Neurology yesterday and given a new medication for migraines. Reports no issues with recent HEP and notes that she was able to walk outside today without particular trouble. Patient does report avoiding bending forward to pick something off the ground at home. Thus, working on gaze stabilization and habituation exercises to further assess patient's tolerance for these activities. Patient was able to perform everything without c/o dizziness. Assessed R sidelying test, with patient demonstrating  slightly larger amplitude R upbeat torsional nystagmus and increased dizziness than previous sessions. 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 Patient then tolerated R Epley maneuver, with ~10 seconds of latent dizziness upon sitting. Patient noted mild 5/10 dizziness at end of session and was escorted to the waiting room with her friend at end of session. Patient seems to tolerate head turns and functional activities well but still with positional dizziness biasing R posterior canal. Plan to treat as needed d/t continued dizziness.    Comorbidities OCD, intractable basilar artery migraine, anxiety, anemia    PT Treatment/Interventions ADLs/Self Care Home Management;Cryotherapy;Electrical Stimulation;Moist Heat;Balance training;Therapeutic exercise;Therapeutic activities;Functional mobility training;Stair training;Gait training;Ultrasound;Neuromuscular re-education;Patient/family education;Vestibular;Manual  techniques;Energy conservation;Passive range of motion    PT Next Visit Plan reassess R DH/R sidelying test    Consulted and Agree with Plan of Care Patient           Patient will benefit from skilled therapeutic intervention in order to improve the following deficits and impairments:  Postural dysfunction, Dizziness, Decreased balance, Decreased mobility, Difficulty walking, Decreased activity tolerance  Visit Diagnosis: Dizziness and giddiness  Unsteadiness on feet  History of falling  Other abnormalities of gait and mobility     Problem List Patient Active Problem List   Diagnosis Date Noted  . 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 Vertigo 10/01/2019     Anette Guarneri, PT, DPT 08/05/20 12:03 PM   [facility] [address]  [address] [place], [place], [address] Phone: [phone]   Fax:  [phone]  Name: Ruhama Lehew MRN: [id number] Date of Birth: 1941-01-28

## 2020-08-10 ENCOUNTER — Telehealth: Payer: Self-pay | Admitting: Neurology

## 2020-08-10 NOTE — Telephone Encounter (Signed)
Received PA request from [facility] for [employer] Key is [id number]. PA was instantly approved until 11/19/20. Will fax a copy of the determination to patient's pharmacy.

## 2020-08-11 ENCOUNTER — Encounter: Payer: Self-pay | Admitting: Physical Therapy

## 2020-08-11 ENCOUNTER — Ambulatory Visit: Payer: Medicare Other | Admitting: Physical Therapy

## 2020-08-11 ENCOUNTER — Other Ambulatory Visit: Payer: Self-pay

## 2020-08-11 DIAGNOSIS — R42 Dizziness and giddiness: Secondary | ICD-10-CM

## 2020-08-11 DIAGNOSIS — Z9181 History of falling: Secondary | ICD-10-CM

## 2020-08-11 DIAGNOSIS — R2689 Other abnormalities of gait and mobility: Secondary | ICD-10-CM

## 2020-08-11 DIAGNOSIS — R2681 Unsteadiness on feet: Secondary | ICD-10-CM

## 2020-08-11 NOTE — Therapy (Signed)
[facility] [address]  [address] [place], [place], [address] Phone: [phone]   Fax:  [phone]  Physical Therapy Treatment  Patient Details  Name: Amber Ruiz MRN: [id number] Date of Birth: 08/07/1941 Referring Provider (PT): Stephanie Acre, MD   Encounter Date: 08/11/2020   PT End of Session - 08/11/20 1201    Visit Number 7    Number of Visits 13    Date for PT Re-Evaluation 08/25/20    Authorization Type Medicare & BCBS    PT Start Time 1015    PT Stop Time 1058    PT Time Calculation (min) 43 min    Equipment Utilized During Treatment Gait belt    Activity Tolerance Patient tolerated treatment well    Behavior During Therapy WFL for tasks assessed/performed           Past Medical History:  Diagnosis Date  . Anemia   . Anxiety   . Hiatal hernia   . 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 OCD (obsessive compulsive disorder)     Past Surgical History:  Procedure Laterality Date  . ABDOMINAL HYSTERECTOMY    . COLONOSCOPY      There were no vitals filed for this visit.   Subjective Assessment - 08/11/20 1017    Subjective Reports that she "did not feel well at all" yesterday d/t her eyes not feeling clear. Attributes this to the weather. Had some trouble filling her prescription so she has not yet taken it yet. Reports no dizziness currently, just having a cloudy feeling in her eyes. Is able to bend forward to pick something off the floor but still finds herself lifting her head up. Notes that she sleeps with 2 pillows d/t feeling dizzy when laying flat. Denies spinning sensation, but wakes up very carefully.    Pertinent History OCD, intractable basilar artery migraine, anxiety, anemia    Diagnostic tests 07/24/19 brain MRI: Mild chronic microvascular ischemic change.  None of the foci appear to be acute. No acute findings and a normal enhancement pattern.    Patient Stated Goals get rid of  dizziness    Currently in Pain? No/denies             Treatment:    Walking with head nods x44ft  Walking with head turns x98ft  Walking + ball bounce x59ft  Walking + ball toss x 24ft  Standing reaching D2 diagonals with ball from floor/overhead x10 each side- no imbalance or dizziness; good speed  STS on foam with hands on knees x5- mild sway  STS on foam with EC and pushing off mat x5- moderate sway  Posterior stepping over beanbags in agility ladder + picking up beanbag from floor- cues to increase length of stepping   Marching on foam x2 min- cueing to adjust feet; no imbalance           PT Education - 08/11/20 1158    Education Details encouraged patient to start slow integration into more active tasks at home to assess for tolerance; encouraged patient to take short daily walks 5-10 minutes for fitness while taking precautionary measures for safety; discussed patient's current abilities and improving functional activity and exercise tolerance    Person(s) Educated Patient    Methods Explanation;Demonstration    Comprehension Verbalized understanding            PT Short Term Goals - 08/05/20 1202      PT SHORT TERM GOAL #1  Title Pt will participate in further assessment of balance and vestibular system (DVA, FGA, hallpike-dix)    Time 2    Period Weeks    Status Achieved    Target Date 07/28/20      PT SHORT TERM GOAL #2   Title Pt will initiate balance and vestibular HEP that is safe for pt to perform at home without family present.    Time 2    Period Weeks    Status Achieved    Target Date 07/28/20             PT Long Term Goals - 07/19/20 1736      PT LONG TERM GOAL #1   Title Pt will be independent with final vestibular and balance HEP and will safely be able to return to walking outside without AD.    Time 6    Period Weeks    Status On-going      PT LONG TERM GOAL #2   Title Pt will decrease falls risk as indicated by DGI  >/=20/24.    Time 6    Period Weeks    Status On-going      PT LONG TERM GOAL #3   Title Pt will negotiate 12 stairs with one rail, holding item in other hand in order to safely access upstairs bedroom at home    Time 6    Period Weeks    Status On-going      PT LONG TERM GOAL #4   Title Patient to be negative for dizziness and nystagmus with positional testing.    Time 6    Period Weeks    Status On-going      PT LONG TERM GOAL #5   Title Patient to demonstrate TUG in <14 sec with LRAD to decrease risk of falls.    Time 6    Period Weeks    Status On-going                 Plan - 08/11/20 1201    Clinical Impression Statement Patient reporting remaining cloudiness of vision which seemed to be exacerbated by yesterday's weather. However, notes no dizziness when getting out of bed or with changes in head position. Notes that she still does not lay fully flat to sleep d/t dizziness, but has not tried this position in a week. Worked on activities to challenge patient's dynamic standing and walking balance today, providing patient with high level challenges. Patient was able to demonstrate good stability throughout and without any apparent path deviation or c/o dizziness. More sway was demonstrated with exercises on unstable surface, however patient without LOB. Discussed slow integration into more active tasks at home and taking short daily walks while taking precautionary measures for safety to increase confidence in her physical abilities. Patient is demonstrating improving balance and positional tolerance at this time.    Comorbidities OCD, intractable basilar artery migraine, anxiety, anemia    PT Treatment/Interventions ADLs/Self Care Home Management;Cryotherapy;Electrical Stimulation;Moist Heat;Balance training;Therapeutic exercise;Therapeutic activities;Functional mobility training;Stair training;Gait training;Ultrasound;Neuromuscular re-education;Patient/family  education;Vestibular;Manual techniques;Energy conservation;Passive range of motion    PT Next Visit Plan reassess R DH/R sidelying test    Consulted and Agree with Plan of Care Patient           Patient will benefit from skilled therapeutic intervention in order to improve the following deficits and impairments:  Postural dysfunction, Dizziness, Decreased balance, Decreased mobility, Difficulty walking, Decreased activity tolerance  Visit Diagnosis: Dizziness and giddiness  Unsteadiness on feet  History of falling  Other abnormalities of gait and mobility     Problem List Patient Active Problem List   Diagnosis Date Noted  . 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 Vertigo 10/01/2019     Anette Guarneri, PT, DPT 08/11/20 12:09 PM   [facility] [address]  [address] [place], [place], [address] Phone: [phone]   Fax:  [phone]  Name: Amber Ruiz MRN: [id number] Date of Birth: 09/17/1941

## 2020-08-12 ENCOUNTER — Ambulatory Visit: Payer: Medicare Other | Admitting: Physical Therapy

## 2020-08-16 ENCOUNTER — Ambulatory Visit: Payer: Medicare Other | Admitting: Physical Therapy

## 2020-08-16 ENCOUNTER — Other Ambulatory Visit: Payer: Self-pay

## 2020-08-16 ENCOUNTER — Encounter: Payer: Self-pay | Admitting: Physical Therapy

## 2020-08-16 DIAGNOSIS — R2681 Unsteadiness on feet: Secondary | ICD-10-CM

## 2020-08-16 DIAGNOSIS — Z9181 History of falling: Secondary | ICD-10-CM

## 2020-08-16 DIAGNOSIS — R42 Dizziness and giddiness: Secondary | ICD-10-CM | POA: Diagnosis not present

## 2020-08-16 DIAGNOSIS — R2689 Other abnormalities of gait and mobility: Secondary | ICD-10-CM

## 2020-08-16 NOTE — Therapy (Signed)
Amber Ruiz [address]  [place], [place], [address] Phone: [phone]   Fax:  [phone]  Physical Therapy Treatment  Patient Details  Name: Amber Ruiz MRN: [id number] Date of Birth: 1941-10-22 Referring Provider (PT): Amber Fast, MD   Encounter Date: 08/16/2020   PT End of Session - 08/16/20 1150    Visit Number 8    Number of Visits 13    Date for PT Re-Evaluation 08/25/20    Authorization Type Medicare & BCBS    PT Start Time 1102    PT Stop Time 1145    PT Time Calculation (min) 43 min    Activity Tolerance Patient tolerated treatment well    Behavior During Therapy [facility] for tasks assessed/performed           Past Medical History:  Diagnosis Date  . Anemia   . Anxiety   . Hiatal hernia   . 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 OCD (obsessive compulsive disorder)     Past Surgical History:  Procedure Laterality Date  . ABDOMINAL HYSTERECTOMY    . COLONOSCOPY      There were no vitals filed for this visit.   Subjective Assessment - 08/16/20 1103    Subjective Doing pretty good and feels like she is doing well. Found out that her new medication that she was prescribed will cost $800/month. D/t the cost, she is waiting to see what her options are.    Pertinent History OCD, intractable basilar artery migraine, anxiety, anemia    Diagnostic tests 07/24/19 brain MRI: Mild chronic microvascular ischemic change.  None of the foci appear to be acute. No acute findings and a normal enhancement pattern.    Patient Stated Goals get rid of dizziness    Currently in Pain? No/denies                   Vestibular Assessment - 08/16/20 0001      Positional Sensitivities   Sit to Supine No dizziness    Supine to Left Side No dizziness    Supine to Right Side Lightheadedness    Supine to Sitting Lightheadedness    Right Hallpike Mild dizziness    Up from Right Hallpike Mild  dizziness    Up from Left Hallpike Lightheadedness    Nose to Right Knee Lightheadedness    Right Knee to Sitting No dizziness    Nose to Left Knee No dizziness    Left Knee to Sitting No dizziness    Head Turning x 5 Lightheadedness    Head Nodding x 5 Lightheadedness    Pivot Right in Standing No dizziness    Pivot Left in Standing No dizziness    Rolling Right Lightheadedness    Rolling Left No dizziness    Positional Sensitivities Comments dizziness seemed to escalate slightly after Amber Ruiz but without observable nystagmus or c/o spinning                    [facility] Adult PT Treatment/Exercise - 08/16/20 0001      Ambulation/Gait   Stairs Yes    Stairs Assistance 6: Modified independent (Device/Increase time)    Stair Management Technique One rail Left;Alternating pattern;Step to pattern    Number of Stairs 26    Height of Stairs 8    Gait Comments able to ascend with reciprocal alternating pattern but tending to perform step-to descending; able to perform alternating reciprocal pattern when prompted  with decreased eccentric control but goo stability      Neuro Re-ed    Neuro Re-ed Details  sidestepping over bean bags and cones, anterior stepping over 1 and 2 shoe boxes, forward/backward stepping with 1 foot on foam x10 each LE   intermittent UE support on counter top                 PT Education - 08/16/20 1149    Education Details education on positional sensitivity test results and how to modify activities according to remaining motion sensitivity; advised patient to talk to her prescribing provider about her migraine medication options    Person(s) Educated Patient    Methods Explanation    Comprehension Verbalized understanding            PT Short Term Goals - 08/05/20 1202      PT SHORT TERM GOAL #1   Title Pt will participate in further assessment of balance and vestibular system (DVA, FGA, hallpike-dix)    Time 2    Period Weeks    Status  Achieved    Target Date 07/28/20      PT SHORT TERM GOAL #2   Title Pt will initiate balance and vestibular HEP that is safe for pt to perform at home without family present.    Time 2    Period Weeks    Status Achieved    Target Date 07/28/20             PT Long Term Goals - 08/16/20 1157      PT LONG TERM GOAL #1   Title Pt will be independent with final vestibular and balance HEP and will safely be able to return to walking outside without AD.    Time 6    Period Weeks    Status Partially Met      PT LONG TERM GOAL #2   Title Pt will decrease falls risk as indicated by DGI >/=20/24.    Time 6    Period Weeks    Status On-going      PT LONG TERM GOAL #3   Title Pt will negotiate 12 stairs with one rail, holding item in other hand in order to safely access upstairs bedroom at home    Time 6    Period Weeks    Status Achieved   able to perform with 1 UE free to hold an object and with alternating reciprocal pattern     PT LONG TERM GOAL #4   Title Patient to be negative for dizziness and nystagmus with positional testing.    Time 6    Period Weeks    Status Partially Met   still some remaining dizziness, worse in R DH and sitting up from R [facility]     PT LONG TERM GOAL #5   Title Patient to demonstrate TUG in <14 sec with LRAD to decrease risk of falls.    Time 6    Period Weeks    Status On-going                 Plan - 08/16/20 1150    Clinical Impression Statement Patient reporting feeling "pretty good" over the weekend. Also reports that she has started to walk for exercise in her neighborhood without issues. Assessed stairs for stability and tolerance, with patient initially performing step-to pattern when descending but able to perform reciprocal alternating pattern when prompted. Patient did demonstrate good stability but showing some decreased eccentric control  d/t LE weakness. Assessed motion sensitivity with patient overall demonstrating good tolerance for  all positions, with most dizziness occurring laying into R DH and sitting up from R [facility]. However, no observable nystagmus was visible.  Proceeded with dynamic standing balance with patient demonstrating much improved stability. Educated patient on how to modify activities according to remaining motion sensitivity. Patient reported understanding and without complaints at end of session.    Comorbidities OCD, intractable basilar artery migraine, anxiety, anemia    PT Treatment/Interventions ADLs/Self Care Home Management;Cryotherapy;Electrical Stimulation;Moist Heat;Balance training;Therapeutic exercise;Therapeutic activities;Functional mobility training;Stair training;Gait training;Ultrasound;Neuromuscular re-education;Patient/family education;Vestibular;Manual techniques;Energy conservation;Passive range of motion    PT Next Visit Plan reassess goals    Consulted and Agree with Plan of Care Patient           Patient will benefit from skilled therapeutic intervention in order to improve the following deficits and impairments:  Postural dysfunction, Dizziness, Decreased balance, Decreased mobility, Difficulty walking, Decreased activity tolerance  Visit Diagnosis: Dizziness and giddiness  Unsteadiness on feet  History of falling  Other abnormalities of gait and mobility     Problem List Patient Active Problem List   Diagnosis Date Noted  . 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 Vertigo 10/01/2019     Janene Harvey, PT, DPT 08/16/20 12:02 PM   [facility] [address]  [place], [place], [address] Phone: [phone]   Fax:  [phone]  Name: Amber Ruiz MRN: [id number] Date of Birth: October 29, 1941

## 2020-08-19 ENCOUNTER — Other Ambulatory Visit: Payer: Self-pay

## 2020-08-19 ENCOUNTER — Encounter: Payer: Self-pay | Admitting: Physical Therapy

## 2020-08-19 ENCOUNTER — Ambulatory Visit: Payer: Medicare Other | Admitting: Physical Therapy

## 2020-08-19 DIAGNOSIS — R42 Dizziness and giddiness: Secondary | ICD-10-CM

## 2020-08-19 DIAGNOSIS — Z9181 History of falling: Secondary | ICD-10-CM

## 2020-08-19 DIAGNOSIS — R2681 Unsteadiness on feet: Secondary | ICD-10-CM

## 2020-08-19 DIAGNOSIS — R2689 Other abnormalities of gait and mobility: Secondary | ICD-10-CM

## 2020-08-19 NOTE — Therapy (Addendum)
[facility] [address]  [place], [place], [address] Phone: [phone]   Fax:  [phone]  Physical Therapy Treatment  Patient Details  Name: Amber Ruiz MRN: [id number] Date of Birth: 02-Jan-1941 Referring Provider (PT): Margette Fast, MD   Progress Note Reporting Period 07/14/20 to 08/19/20  See note below for Objective Data and Assessment of Progress/Goals.    Encounter Date: 08/19/2020   PT End of Session - 08/19/20 1153    Visit Number 9    Number of Visits 13    Date for PT Re-Evaluation 08/25/20    Authorization Type Medicare & BCBS    PT Start Time 1100    PT Stop Time 1146    PT Time Calculation (min) 46 min    Equipment Utilized During Treatment Gait belt    Activity Tolerance Patient tolerated treatment well    Behavior During Therapy WFL for tasks assessed/performed           Past Medical History:  Diagnosis Date  . Anemia   . Anxiety   . Hiatal hernia   . 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 OCD (obsessive compulsive disorder)     Past Surgical History:  Procedure Laterality Date  . ABDOMINAL HYSTERECTOMY    . COLONOSCOPY      There were no vitals filed for this visit.   Subjective Assessment - 08/19/20 1101    Subjective Patient reports that she worked in her garden yesterday and felt pretty good despite wearing herself out. Admits to having 2 falls in the woods but did not get hurt and did not get dizzy.Reports 90-95% improvement since initial eval. Still gets a "funny" or "lightheaded" feeling when she gets too hungry or thristy but denies spinning. Reports that she is still waiting on new medication payment options.    Pertinent History OCD, intractable basilar artery migraine, anxiety, anemia    Diagnostic tests 07/24/19 brain MRI: Mild chronic microvascular ischemic change.  None of the foci appear to be acute. No acute findings and a normal enhancement pattern.     Patient Stated Goals get rid of dizziness    Currently in Pain? No/denies              [facility] PT Assessment - 08/19/20 0001      Assessment   Medical Diagnosis Vertigo    Referring Provider (PT) Margette Fast, MD    Onset Date/Surgical Date 06/16/20      Dynamic Gait Index   Level Surface Normal    Change in Gait Speed Normal    Gait with Horizontal Head Turns Mild Impairment    Gait with Vertical Head Turns Normal    Gait and Pivot Turn Normal    Step Over Obstacle Moderate Impairment    Step Around Obstacles Normal    Steps Mild Impairment    Total Score 20      Timed Up and Go Test   Normal TUG (seconds) 10.8    TUG Comments no AD                         [facility] Adult PT Treatment/Exercise - 08/19/20 0001      Neuro Re-ed    Neuro Re-ed Details  demonstration and practice performing balance HEP including alt toe tap on cone and forward steping over cone at counter top           Vestibular Treatment/Exercise - 08/19/20  0001      X1 Viewing Horizontal   Foot Position standing    Reps 10   10x VOR; 10x VOR cancellation   Comments no dizziness; more trouble fixating with L head turn      X1 Viewing Vertical   Foot Position standing     Comments no dizziness                 PT Education - 08/19/20 1151    Education Details update/consolidation of HEP; discussion on objective progress and remaining impairments; heavily educated patient on use of SPC or walking stick while outside in the garden to avoid recurrent falls    Person(s) Educated Patient    Methods Explanation;Demonstration;Tactile cues;Verbal cues;Handout    Comprehension Verbalized understanding;Returned demonstration            PT Short Term Goals - 08/19/20 1105      PT SHORT TERM GOAL #1   Title Pt will participate in further assessment of balance and vestibular system (DVA, FGA, hallpike-dix)    Time 2    Period Weeks    Status Achieved    Target Date 07/28/20        PT SHORT TERM GOAL #2   Title Pt will initiate balance and vestibular HEP that is safe for pt to perform at home without family present.    Time 2    Period Weeks    Status Achieved    Target Date 07/28/20             PT Long Term Goals - 08/19/20 1123      PT LONG TERM GOAL #1   Title Pt will be independent with final vestibular and balance HEP and will safely be able to return to walking outside without AD.    Time 6    Period Weeks    Status Achieved   patient has been walking outside for fitness and independent with HEP     PT LONG TERM GOAL #2   Title Pt will decrease falls risk as indicated by DGI >/=20/24.    Time 6    Period Weeks    Status Achieved      PT LONG TERM GOAL #3   Title Pt will negotiate 12 stairs with one rail, holding item in other hand in order to safely access upstairs bedroom at home    Time 6    Period Weeks    Status Achieved   able to perform with 1 UE free to hold an object and with alternating reciprocal pattern     PT LONG TERM GOAL #4   Title Patient to be negative for dizziness and nystagmus with positional testing.    Time 6    Period Weeks    Status Partially Met   still some remaining dizziness, worse in R DH and sitting up from R [facility] but without nystagmus     PT LONG TERM GOAL #5   Title Patient to demonstrate TUG in <14 sec with LRAD to decrease risk of falls.    Time 6    Period Weeks    Status Achieved                 Plan - 08/19/20 1200    Clinical Impression Statement Patient reporting 90-95% improvement since initial eval. Reports that she was able to work in her yard yesterday but admits to having 2 falls when walking in the woods. Denies injury or dizziness. Notes  that she is still seeking out payment options for the new medication that as prescribed to her. Patient has now met both balance assessment goals, as she scored well on DGI and TUG. Did heavily educate patient on consistent use of SPC or walking stick  when walking outside in her woods to avoid falls and serious injury. Patient reported agreement to using AD. Patient notes that she has returned to walking in her neighborhood for fitness without issues, and reported understanding of newly updated vestibular and balance HEP. Patient has demonstrated great progress towards goals and is now ready to transition to home program on 30 day hold. Patient advised that she is free to return if any issues arise.    Comorbidities OCD, intractable basilar artery migraine, anxiety, anemia    PT Treatment/Interventions ADLs/Self Care Home Management;Cryotherapy;Electrical Stimulation;Moist Heat;Balance training;Therapeutic exercise;Therapeutic activities;Functional mobility training;Stair training;Gait training;Ultrasound;Neuromuscular re-education;Patient/family education;Vestibular;Manual techniques;Energy conservation;Passive range of motion    PT Next Visit Plan 30 day hold    Consulted and Agree with Plan of Care Patient           Patient will benefit from skilled therapeutic intervention in order to improve the following deficits and impairments:  Postural dysfunction, Dizziness, Decreased balance, Decreased mobility, Difficulty walking, Decreased activity tolerance  Visit Diagnosis: Dizziness and giddiness  Unsteadiness on feet  History of falling  Other abnormalities of gait and mobility     Problem List Patient Active Problem List   Diagnosis Date Noted  . 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 Vertigo 10/01/2019     Janene Harvey, PT, DPT 08/19/20 12:05 PM   [facility] [address]  [place], [place], [address] Phone: [phone]   Fax:  [phone]  Name: Amber Ruiz MRN: [id number] Date of Birth: 1941-07-27  PHYSICAL THERAPY DISCHARGE SUMMARY  Visits from Start of Care: 9  Current functional level related to goals / functional  outcomes: See above clinical impression; patient did not return during 30 day hold   Remaining deficits: Mild dizziness with positional testing   Education / Equipment: HEP  Plan: Patient agrees to discharge.  Patient goals were partially met. Patient is being discharged due to being pleased with the current functional level.  ?????     Janene Harvey, PT, DPT 09/22/20 1:31 PM

## 2020-11-23 ENCOUNTER — Other Ambulatory Visit: Payer: Self-pay | Admitting: Neurology

## 2021-02-01 ENCOUNTER — Ambulatory Visit: Payer: Medicare Other | Admitting: Neurology

## 2021-05-04 ENCOUNTER — Other Ambulatory Visit: Payer: Self-pay

## 2021-05-04 ENCOUNTER — Encounter: Payer: Self-pay | Admitting: Neurology

## 2021-05-04 ENCOUNTER — Ambulatory Visit (INDEPENDENT_AMBULATORY_CARE_PROVIDER_SITE_OTHER): Payer: Medicare Other | Admitting: Neurology

## 2021-05-04 VITALS — BP 136/77 | HR 73 | Ht 60.0 in | Wt 137.0 lb

## 2021-05-04 DIAGNOSIS — G43119 Migraine with aura, intractable, without status migrainosus: Secondary | ICD-10-CM

## 2021-05-04 MED ORDER — EMGALITY 120 MG/ML ~~LOC~~ SOAJ: 120.0000 mg | SUBCUTANEOUS | 11 refills | Status: DC

## 2021-05-04 MED ORDER — EMGALITY 120 MG/ML ~~LOC~~ SOAJ: 240.0000 mg | SUBCUTANEOUS | 0 refills | Status: DC

## 2021-05-04 NOTE — Progress Notes (Signed)
I have read the note, and I agree with the clinical assessment and plan.  Towanda Hornstein K Nakyia Dau   

## 2021-05-04 NOTE — Patient Instructions (Addendum)
Let's try the Emgality injection for headache prevention, to prevent dizziness episodes Start with 240 mg injection for the 1st month, then do 120 mg monthly injection for migraine prevention  See you back in 6 months Get back to doing your vestibular rehab exercises

## 2021-05-04 NOTE — Progress Notes (Signed)
PATIENT: Amber Ruiz DOB: 07-15-1941  REASON FOR VISIT: follow up HISTORY FROM: patient  HISTORY OF PRESENT ILLNESS: Today 05/04/21  [name] is an 80 year old female with history of chronic vertigo and migraine headache.  Had a bad bout of headache, severe vertigo with nausea back in August 2021.  Felt to be a migrainous component.  Sent for vestibular rehab, had excellent benefit.  Has not been able to tolerate Depakote, propanolol, Topamax.  Never started Aimovig.  Emgality was ordered at last visit, but once covered, she felt better, never started.  Has not had any further severe bouts.  Has occasional mild dizzy spells, but has had some recent low iron that could be contributing.  She does have history of headache, are mild, retro-orbital, 1/week, can last 2 to 3 days.  Does have history of migraine headache in her younger years.  She lives alone.  Enjoys working in her garden.  Admits she has not continue her vestibular rehab exercises. The goal is to prevent these severe episodes from happening again. Here today for evaluation accompanied by her daughter.  Update 08/04/2020 SS: [name] is a 80 year old female with history of chronic vertigo, history of migraine.  Has previously completed vestibular rehab.  In early August, had severe vertigo with nausea, and diarrhea.  Placed on meclizine 25 mg 1/2 tablet 3 times a day, Zofran, 3-day course of Decadron. Sent for vestibular rehab. Aimovig ordered, never started. Tapered off Topamax due to decreased appetite, couldn't tolerate Depakote or propranolol.  Has been in vestibular rehab since 8/25, feels 80% better, has started staying alone again, but only downstairs due to feel of falling.  Has been able to do some work in her garden, use a weed eater.  Meclizine was not helpful, had terrible side effects. Not currently dizzy, but head doesn't feel clear.  Not driving.  No headache per se.  Is otherwise healthy person, was very independent  before onset of vertigo in May 2020. Her PT messaged she is demonstrating subjective improvement, but has consistent nystagmus and symptoms of BPPV on testing. I called and talked with her son, [name]. She is here alone, her friend drove her.   HISTORY 02/02/2020 Dr. Anne Hahn: [name] is a 80 year old right-handed white female with a history of chronic vertigo.  The patient has undergone vestibular therapy with good improvement, she still has some sensation of dizziness but she has not had severe bouts of vertigo in about 3 to 4 months.  The patient has just finished her vestibular rehabilitation.  She is still not operating a motor vehicle.  She is having minimal problems with the headache, she did not go on the Aimovig.  The headaches are essentially not an issue for her currently.  She is concerned about some mild memory problems however.  Her family has noted that she is repeating himself.  The patient is living alone, her son does the finances, and her daughter keeps up with appointments.  The patient does her own medications.  She notes that if she does a lot of stooping and bending this will worsen her vertigo.   REVIEW OF SYSTEMS: Out of a complete 14 system review of symptoms, the patient complains only of the following symptoms, and all other reviewed systems are negative.  See HPI  ALLERGIES: No Known Allergies  HOME MEDICATIONS: Outpatient Medications Prior to Visit  Medication Sig Dispense Refill   alendronate (FOSAMAX) 70 MG tablet TK 1 T PO  PO Q WEEK  Ascorbic Acid (VITAMIN C) 100 MG tablet Take by mouth.     cetirizine (ZYRTEC) 5 MG tablet Take 5 mg by mouth daily.     citalopram (CELEXA) 40 MG tablet Take 20 mg by mouth daily.      dexamethasone (DECADRON) 2 MG tablet Take 3 tablets the first day, 2 the second and 1 the third day 6 tablet 0   lisinopril (ZESTRIL) 10 MG tablet Take 10 mg by mouth daily.     pantoprazole (PROTONIX) 20 MG tablet Take 20 mg by mouth daily.      Galcanezumab-gnlm (EMGALITY) 120 MG/ML SOAJ Inject 240 mg into the skin every 30 (thirty) days. 2 mL 0   Galcanezumab-gnlm (EMGALITY) 120 MG/ML SOAJ Inject 120 mg into the skin every 30 (thirty) days. 1 mL 11   No facility-administered medications prior to visit.    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  SOCIAL HISTORY: Social History   Socioeconomic History   Marital status: Married    Spouse name: Not on file   Number of children: Not on file   Years of education: Not on file   Highest education level: Not on file  Occupational History   Not on file  Tobacco Use   Smoking status: Never   Smokeless tobacco: Never  Substance and Sexual Activity   Alcohol use: No   Drug use: Not on file   Sexual activity: Not on file  Other Topics Concern   Not on file  Social History Narrative   Not on file   Social Determinants of Health   Financial Resource Strain: Not on file  Food Insecurity: Not on file  Transportation Needs: Not on file  Physical Activity: Not on file  Stress: Not on file  Social Connections: Not on file  Intimate Partner Violence: Not on file   PHYSICAL EXAM  Vitals:   05/04/21 1302  BP: 136/77  Pulse: 73  Weight: 137 lb (62.1 kg)  Height: 5' (1.524 m)    Body mass index is 26.76 kg/m. Generalized: Well developed, in no acute distress, well appearing Neurological examination  Mentation: Alert oriented to time, place, history taking. Follows all commands speech and language fluent Cranial nerve II-XII: Pupils were equal round reactive to light. Extraocular movements were full, visual field were full on confrontational test. Facial  sensation and strength were normal.  Head turning and shoulder shrug  were normal and symmetric. Motor: The motor testing reveals 5 over 5 strength of all 4 extremities. Good symmetric motor tone is noted throughout.  Sensory: Sensory testing is intact to soft touch on all 4 extremities. No evidence of extinction is noted.  Coordination: Cerebellar testing reveals good finger-nose-finger and heel-to-shin bilaterally.  Gait and station: Gait is normal.  Tandem gait is slightly unsteady. Reflexes: Deep tendon reflexes are symmetric and normal bilaterally.   DIAGNOSTIC DATA (LABS, IMAGING, TESTING) - I reviewed patient records, labs, notes, testing and imaging myself where available.  Lab Results  Component Value Date   WBC 7.4 06/16/2019   HGB 15.3 (H) 06/16/2019   HCT 48.9 (H) 06/16/2019   MCV 87.6 06/16/2019  PLT 253 06/16/2019      Component Value Date/Time   NA 139 06/16/2019 1624   K 4.3 06/16/2019 1624   CL 102 06/16/2019 1624   CO2 28 06/16/2019 1624   GLUCOSE 112 (H) 06/16/2019 1624   BUN 16 06/16/2019 1624   CREATININE 0.70 06/16/2019 1624   CALCIUM 9.3 06/16/2019 1624   PROT 6.9 06/16/2019 1624   ALBUMIN 4.0 06/16/2019 1624   AST 19 06/16/2019 1624   ALT 16 06/16/2019 1624   ALKPHOS 90 06/16/2019 1624   BILITOT 0.4 06/16/2019 1624   GFRNONAA >60 06/16/2019 1624   GFRAA >60 06/16/2019 1624   No results found for: CHOL, HDL, LDLCALC, LDLDIRECT, TRIG, CHOLHDL No results found for: NLZJ6B Lab Results  Component Value Date   VITAMINB12 452 06/30/2019   No results found for: TSH  ASSESSMENT AND PLAN 80 y.o. year old female  has a past medical history of Anemia, Anxiety, Hiatal hernia, Intractable basilar artery migraine (12/02/2019), and OCD (obsessive compulsive disorder). here with:  1.  Chronic vertigo 2.  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  This included previsit chart review, lab review, study review, order entry, discussing Emgality, vestibular exercises, follow-up.  Margie Ege, AGNP-C, DNP 05/04/2021, 1:50 PM [facility] [address], [address] [place], [place] [address] [phone]

## 2021-05-09 ENCOUNTER — Telehealth: Payer: Self-pay | Admitting: Neurology

## 2021-05-09 NOTE — Telephone Encounter (Signed)
Pt's daughter, Abbee Cremeens (on [place]) called, received a message from her insurance Galcanezumab-gnlm [facility]) 120 MG/ML SOAJ will be $900 per month. If we can find a way to make cheaper or another medication. Would like a call from the nurse.

## 2021-05-09 NOTE — Telephone Encounter (Signed)
[employer] and spoke w/ [place]. States Emgality is a tier 4 drug. High copay for patient of 929.00. No PA needed.  Aimovig listed as tier 4-high copay (needs PA).  Ajovy listed as non-formulary.

## 2021-05-10 NOTE — Telephone Encounter (Signed)
Called and spoke w/ daughter, Vernona Rieger (on [place]). She is going to try and see if her mother qualifies for assistance via [employer]. She did not have anything to write phone# down. She will call back for phone#: [phone]. [phone]. Please provide if she calls back.  Aimovig would still have high copay for pt and would require a PA.

## 2021-05-19 NOTE — Telephone Encounter (Signed)
Pt's daughter called back and the # to [employer] was given to her, this is Financial planner for [place], RN no call back requested

## 2021-11-08 NOTE — Progress Notes (Signed)
PATIENT: Amber Ruiz DOB: Aug 20, 1941  REASON FOR VISIT: follow up history of vertigo and migraine headache  HISTORY FROM: patient and her son   HISTORY OF PRESENT ILLNESS: Today 11/09/21  Amber Ruiz here today for follow-up.  Since last seen in June, denies any significant episodes of dizziness or headache. Really cannot think of even any mild episodes.  Continues to live alone. Her son and daughter live in [place]. She drives short distances, works in her garden, has remained active.  Denies any changes to her overall health.  Admits she has not been diligent to continue her vestibular rehab exercises. This has been Secretary/administrator for her in the past. Towards end of visit, mentions her PCP referred her back in Fall for sleep consult due to morning headache, daytime fatigue, feeling like she never went to sleep. Doesn't think she snores, but does live alone.  In the past, her severe vertigo was felt to be a migrainous component, was tried on Depakote, propanolol, Topamax without tolerability.  Never started Aimovig or Emgality.  Here today accompanied by her son, Amber Ruiz.  Update 05/04/2021 SS: Amber Ruiz is an 80 year old female with history of chronic vertigo and migraine headache.  Had a bad bout of headache, severe vertigo with nausea back in August 2021.  Felt to be a migrainous component.  Sent for vestibular rehab, had excellent benefit.  Has not been able to tolerate Depakote, propanolol, Topamax.  Never started Aimovig.  Emgality was ordered at last visit, but once covered, she felt better, never started.  Has not had any further severe bouts.  Has occasional mild dizzy spells, but has had some recent low iron that could be contributing.  She does have history of headache, are mild, retro-orbital, 1/week, can last 2 to 3 days.  Does have history of migraine headache in her younger years.  She lives alone.  Enjoys working in her garden.  Admits she has not continue her vestibular rehab  exercises. The goal is to prevent these severe episodes from happening again. Here today for evaluation accompanied by her daughter.  Update 08/04/2020 SS: Amber Ruiz is a 80 year old female with history of chronic vertigo, history of migraine.  Has previously completed vestibular rehab.  In early August, had severe vertigo with nausea, and diarrhea.  Placed on meclizine 25 mg 1/2 tablet 3 times a day, Zofran, 3-day course of Decadron. Sent for vestibular rehab. Aimovig ordered, never started. Tapered off Topamax due to decreased appetite, couldn't tolerate Depakote or propranolol.  Has been in vestibular rehab since 8/25, feels 80% better, has started staying alone again, but only downstairs due to feel of falling.  Has been able to do some work in her garden, use a weed eater.  Meclizine was not helpful, had terrible side effects. Not currently dizzy, but head doesn't feel clear.  Not driving.  No headache per se.  Is otherwise healthy person, was very independent before onset of vertigo in May 2020. Her PT messaged she is demonstrating subjective improvement, but has consistent nystagmus and symptoms of BPPV on testing. I called and talked with her son, Amber Ruiz. She is here alone, her friend drove her.   HISTORY 02/02/2020 Dr. Anne Hahn: Amber Ruiz is a 80 year old right-handed white female with a history of chronic vertigo.  The patient has undergone vestibular therapy with good improvement, she still has some sensation of dizziness but she has not had severe bouts of vertigo in about 3 to 4 months.  The patient has  just finished her vestibular rehabilitation.  She is still not operating a motor vehicle.  She is having minimal problems with the headache, she did not go on the Aimovig.  The headaches are essentially not an issue for her currently.  She is concerned about some mild memory problems however.  Her family has noted that she is repeating himself.  The patient is living alone, her son does the finances,  and her daughter keeps up with appointments.  The patient does her own medications.  She notes that if she does a lot of stooping and bending this will worsen her vertigo.   REVIEW OF SYSTEMS: Out of a complete 14 system review of symptoms, the patient complains only of the following symptoms, and all other reviewed systems are negative.  See HPI  ALLERGIES: No Known Allergies  HOME MEDICATIONS: Outpatient Medications Prior to Visit  Medication Sig Dispense Refill   alendronate (FOSAMAX) 70 MG tablet TK 1 T PO  PO Q WEEK     Ascorbic Acid (VITAMIN C) 100 MG tablet Take by mouth.     cetirizine (ZYRTEC) 5 MG tablet Take 5 mg by mouth daily.     citalopram (CELEXA) 40 MG tablet Take 20 mg by mouth daily.      lisinopril (ZESTRIL) 10 MG tablet Take 10 mg by mouth daily.     pantoprazole (PROTONIX) 20 MG tablet Take 20 mg by mouth daily.     dexamethasone (DECADRON) 2 MG tablet Take 3 tablets the first day, 2 the second and 1 the third day 6 tablet 0   Galcanezumab-gnlm (EMGALITY) 120 MG/ML SOAJ Inject 240 mg into the skin every 30 (thirty) days. 2 mL 0   Galcanezumab-gnlm (EMGALITY) 120 MG/ML SOAJ Inject 120 mg into the skin every 30 (thirty) days. 1 mL 11   No facility-administered medications prior to visit.    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 No pertinent family history.  SOCIAL HISTORY: Social History   Socioeconomic History   Marital status: Married    Spouse name: Not on file   Number of children: Not on file   Years of education: Not on file   Highest education level: Not on file  Occupational History   Not on file  Tobacco Use   Smoking status: Never    Smokeless tobacco: Never  Substance and Sexual Activity   Alcohol use: No   Drug use: Not on file   Sexual activity: Not on file  Other Topics Concern   Not on file  Social History Narrative   Not on file   Social Determinants of Health   Financial Resource Strain: Not on file  Food Insecurity: Not on file  Transportation Needs: Not on file  Physical Activity: Not on file  Stress: Not on file  Social Connections: Not on file  Intimate Partner Violence: Not on file   PHYSICAL EXAM  Vitals:   11/09/21 1317  BP: (!) 145/80  Pulse: 77  Weight: 144 lb (65.3 kg)  Height: 5' (1.524 m)   Body mass index is 28.12 kg/m. Generalized: Well developed, in no acute distress, well appearing Neurological examination  Mentation: Alert oriented to time, place, history taking. Follows all commands speech and language fluent Cranial nerve II-XII: Pupils were equal round reactive to light. Extraocular movements were full, visual field were full on confrontational test. Facial sensation and strength were normal.  Head turning and shoulder shrug  were normal and symmetric. Motor: The motor testing reveals 5 over 5 strength of all 4 extremities. Good symmetric motor tone is noted throughout.  Sensory: Sensory testing is intact to soft touch on all 4 extremities. No evidence of extinction is noted.  Coordination: Cerebellar testing reveals good finger-nose-finger and heel-to-shin bilaterally.  Gait and station: Gait is normal.  Tandem gait is slightly unsteady. Reflexes: Deep tendon reflexes are symmetric and normal bilaterally.   DIAGNOSTIC DATA (LABS, IMAGING, TESTING) - I reviewed patient records, labs, notes, testing and imaging myself where available.  Lab Results  Component Value Date   WBC 7.4 06/16/2019   HGB 15.3 (H) 06/16/2019   HCT 48.9 (H) 06/16/2019   MCV 87.6 06/16/2019   PLT 253 06/16/2019      Component Value Date/Time   NA 139 06/16/2019 1624   K 4.3 06/16/2019 1624    CL 102 06/16/2019 1624   CO2 28 06/16/2019 1624   GLUCOSE 112 (H) 06/16/2019 1624   BUN 16 06/16/2019 1624   CREATININE 0.70 06/16/2019 1624   CALCIUM 9.3 06/16/2019 1624   PROT 6.9 06/16/2019 1624   ALBUMIN 4.0 06/16/2019 1624   AST 19 06/16/2019 1624   ALT 16 06/16/2019 1624   ALKPHOS 90 06/16/2019 1624   BILITOT 0.4 06/16/2019 1624   GFRNONAA >60 06/16/2019 1624   GFRAA >60 06/16/2019 1624   No results found for: CHOL, HDL, LDLCALC, LDLDIRECT, TRIG, CHOLHDL No results found for: VEHM0N Lab Results  Component Value Date   VITAMINB12 452 06/30/2019   No results found for: TSH  ASSESSMENT AND PLAN 80 y.o. year old female  has a past medical history of Anemia, Anxiety, Hiatal hernia, Intractable basilar artery migraine (12/02/2019), and OCD (obsessive compulsive disorder). here with:  1.  Chronic vertigo 2.  Basilar migraine, intractable 3.  Fatigue, morning headache   -Overall doing well, no significant spells since August 2021 -Has had the most benefit with vestibular rehab, have encouraged to continue her exercises -Has previously tried and failed Depakote, propanolol, Topamax, never started Aimovig or Emgality (too expensive) -For now, we will hold off on any daily medication given that she has not had recurrence of any symptoms -I will refer her for sleep consultation at our office given that her PCP had referred her in [place] but her son wishes for her to come here, indication: Daytime fatigue, feeling like she never gets to sleep, frequent morning headache, ESS was 5 but was unsure about how to answer some questions, she didn't complete the FSS -Depending on results on sleep study I may see her sooner, otherwise will be in 1 year, encouraged her to reach out with any worsening in condition, I had planned for Dr. Lucia Gaskins to be her primary neurologist since Dr. Anne Hahn retired, but if there is a sleep issue, may make most since to stay with Dr. Frances Furbish or Dohmeier and I can follow her    Otila Kluver, DNP 11/09/2021, 3:47 PM [facility] [address], [address] [place], [place] [address] [phone]

## 2021-11-09 ENCOUNTER — Ambulatory Visit (INDEPENDENT_AMBULATORY_CARE_PROVIDER_SITE_OTHER): Payer: Medicare Other | Admitting: Neurology

## 2021-11-09 ENCOUNTER — Encounter: Payer: Self-pay | Admitting: Neurology

## 2021-11-09 ENCOUNTER — Other Ambulatory Visit: Payer: Self-pay

## 2021-11-09 VITALS — BP 145/80 | HR 77 | Ht 60.0 in | Wt 144.0 lb

## 2021-11-09 DIAGNOSIS — R42 Dizziness and giddiness: Secondary | ICD-10-CM

## 2021-11-09 DIAGNOSIS — G43119 Migraine with aura, intractable, without status migrainosus: Secondary | ICD-10-CM | POA: Diagnosis not present

## 2021-11-09 NOTE — Patient Instructions (Signed)
Please continue your vestibular exercises If symptoms start to return please let me know immediately  See you back in 1 year or sooner if needed Primary neurologist will be Dr. Lucia Gaskins

## 2022-05-07 ENCOUNTER — Encounter (HOSPITAL_BASED_OUTPATIENT_CLINIC_OR_DEPARTMENT_OTHER): Payer: Self-pay

## 2022-05-07 ENCOUNTER — Other Ambulatory Visit: Payer: Self-pay

## 2022-05-07 ENCOUNTER — Emergency Department (HOSPITAL_BASED_OUTPATIENT_CLINIC_OR_DEPARTMENT_OTHER)
Admission: EM | Admit: 2022-05-07 | Discharge: 2022-05-07 | Disposition: A | Discharge status: Home / Self Care | Payer: Medicare Other | Attending: Emergency Medicine | Admitting: Emergency Medicine

## 2022-05-07 ENCOUNTER — Emergency Department (HOSPITAL_BASED_OUTPATIENT_CLINIC_OR_DEPARTMENT_OTHER): Payer: Medicare Other

## 2022-05-07 DIAGNOSIS — D709 Neutropenia, unspecified: Secondary | ICD-10-CM | POA: Diagnosis not present

## 2022-05-07 DIAGNOSIS — N3000 Acute cystitis without hematuria: Secondary | ICD-10-CM | POA: Diagnosis not present

## 2022-05-07 DIAGNOSIS — Z20822 Contact with and (suspected) exposure to covid-19: Secondary | ICD-10-CM | POA: Insufficient documentation

## 2022-05-07 DIAGNOSIS — D696 Thrombocytopenia, unspecified: Secondary | ICD-10-CM | POA: Diagnosis not present

## 2022-05-07 DIAGNOSIS — R519 Headache, unspecified: Secondary | ICD-10-CM | POA: Diagnosis not present

## 2022-05-07 LAB — HEPATIC FUNCTION PANEL
ALT: 28 U/L (ref 0–44)
AST: 40 U/L (ref 15–41)
Albumin: 3.1 g/dL — ABNORMAL LOW (ref 3.5–5.0)
Alkaline Phosphatase: 71 U/L (ref 38–126)
Bilirubin, Direct: 0.1 mg/dL (ref 0.0–0.2)
Indirect Bilirubin: 0.3 mg/dL (ref 0.3–0.9)
Total Bilirubin: 0.4 mg/dL (ref 0.3–1.2)
Total Protein: 5.7 g/dL — ABNORMAL LOW (ref 6.5–8.1)

## 2022-05-07 LAB — CBC WITH DIFFERENTIAL/PLATELET
Abs Immature Granulocytes: 0.01 10*3/uL (ref 0.00–0.07)
Basophils Absolute: 0 10*3/uL (ref 0.0–0.1)
Basophils Relative: 1 %
Eosinophils Absolute: 0 10*3/uL (ref 0.0–0.5)
Eosinophils Relative: 0 %
HCT: 40.3 % (ref 36.0–46.0)
Hemoglobin: 12.9 g/dL (ref 12.0–15.0)
Immature Granulocytes: 1 %
Lymphocytes Relative: 13 %
Lymphs Abs: 0.3 10*3/uL — ABNORMAL LOW (ref 0.7–4.0)
MCH: 26.9 pg (ref 26.0–34.0)
MCHC: 32 g/dL (ref 30.0–36.0)
MCV: 84 fL (ref 80.0–100.0)
Monocytes Absolute: 0.2 10*3/uL (ref 0.1–1.0)
Monocytes Relative: 11 %
Neutro Abs: 1.4 10*3/uL — ABNORMAL LOW (ref 1.7–7.7)
Neutrophils Relative %: 74 %
Platelets: 55 10*3/uL — ABNORMAL LOW (ref 150–400)
RBC: 4.8 MIL/uL (ref 3.87–5.11)
RDW: 14.7 % (ref 11.5–15.5)
WBC: 1.9 10*3/uL — ABNORMAL LOW (ref 4.0–10.5)
nRBC: 0 % (ref 0.0–0.2)

## 2022-05-07 LAB — RESP PANEL BY RT-PCR (FLU A&B, COVID) ARPGX2
Influenza A by PCR: NEGATIVE
Influenza B by PCR: NEGATIVE
SARS Coronavirus 2 by RT PCR: NEGATIVE

## 2022-05-07 LAB — BASIC METABOLIC PANEL
Anion gap: 6 (ref 5–15)
BUN: 16 mg/dL (ref 8–23)
CO2: 23 mmol/L (ref 22–32)
Calcium: 8.2 mg/dL — ABNORMAL LOW (ref 8.9–10.3)
Chloride: 105 mmol/L (ref 98–111)
Creatinine, Ser: 0.78 mg/dL (ref 0.44–1.00)
GFR, Estimated: >60 mL/min (ref >60)
Glucose, Bld: 140 mg/dL — ABNORMAL HIGH (ref 70–99)
Potassium: 3.5 mmol/L (ref 3.5–5.1)
Sodium: 134 mmol/L — ABNORMAL LOW (ref 135–145)

## 2022-05-07 LAB — URINALYSIS, MICROSCOPIC (REFLEX): RBC / HPF: NONE SEEN RBC/hpf (ref 0–5)

## 2022-05-07 LAB — URINALYSIS, ROUTINE W REFLEX MICROSCOPIC
Bilirubin Urine: NEGATIVE
Glucose, UA: NEGATIVE mg/dL
Hgb urine dipstick: NEGATIVE
Ketones, ur: NEGATIVE mg/dL
Nitrite: NEGATIVE
Protein, ur: 30 mg/dL — AB
Specific Gravity, Urine: 1.02 (ref 1.005–1.030)
pH: 6 (ref 5.0–8.0)

## 2022-05-07 MED ORDER — CEPHALEXIN 500 MG PO CAPS: 500.0000 mg | ORAL_CAPSULE | Freq: Three times a day (TID) | ORAL | 0 refills | Status: AC

## 2022-05-07 MED ORDER — NITROFURANTOIN MONOHYD MACRO 100 MG PO CAPS: 100.0000 mg | ORAL_CAPSULE | Freq: Two times a day (BID) | ORAL | 0 refills | Status: DC

## 2022-05-07 MED ORDER — IOHEXOL 350 MG/ML SOLN
100.0000 mL | Freq: Once | INTRAVENOUS | Status: AC | PRN
  Administered 2022-05-07: 100 mL via INTRAVENOUS

## 2022-05-07 MED ORDER — DOXYCYCLINE HYCLATE 100 MG PO CAPS: 100.0000 mg | ORAL_CAPSULE | Freq: Two times a day (BID) | ORAL | 0 refills | Status: AC

## 2022-05-07 NOTE — ED Triage Notes (Signed)
Patient c/o fever with headache on right side of head for about 2 days. Patient denies N/V/D. Denies changes in urinary symptoms. Took 600mg  motrin PTA

## 2022-05-07 NOTE — ED Provider Notes (Signed)
[facility] EMERGENCY DEPARTMENT Provider Note   CSN: [id number] Arrival date & time: 05/07/22  1050     History  Chief Complaint  Patient presents with   Fever   Headache    Amber Ruiz is a 81 y.o. female with a history of iron deficiency anemia anxiety who presents to the ED for evaluation of a headache on the right side of her head that started yesterday.  Patient states that it is a sharp pain that stabs on the side of her head and radiates down and then repeats that sensation over and over again.  She states that she had a low-grade fever of 100.3 F yesterday.  She also feels sweaty today and insist that she "does not sweat very much".  She otherwise denies cough, congestion, urinary symptoms, chest pain, shortness of breath.  She has no previous history of cardiac disease, CVA.  Patient and daughter deny slurred speech, unilateral weakness or ataxia.  Daughter notes that patient has been possibly diagnosed in the past with atypical migraines where she gets auras but none of the pain symptoms.  Patient also has history of vertigo and has undergone therapy for that has not had symptoms in about 1 year.   Fever Associated symptoms: headaches   Headache Associated symptoms: fever        Home Medications Prior to Admission medications   Medication Sig Start Date End Date Taking? Authorizing Provider  cephALEXin (KEFLEX) 500 MG capsule Take 1 capsule (500 mg total) by mouth 3 (three) times daily for 10 days. 05/07/22 05/17/22 Yes Alvira Monday, MD  doxycycline (VIBRAMYCIN) 100 MG capsule Take 1 capsule (100 mg total) by mouth 2 (two) times daily for 10 days. 05/07/22 05/17/22 Yes Alvira Monday, MD  alendronate (FOSAMAX) 70 MG tablet TK 1 T PO  PO Q WEEK 01/14/18   [provider]  Ascorbic Acid (VITAMIN C) 100 MG tablet Take by mouth.    [provider]  cetirizine (ZYRTEC) 5 MG tablet Take 5 mg by mouth daily.    [provider]   citalopram (CELEXA) 40 MG tablet Take 20 mg by mouth daily.     [provider]  lisinopril (ZESTRIL) 10 MG tablet Take 10 mg by mouth daily.    [provider]  pantoprazole (PROTONIX) 20 MG tablet Take 20 mg by mouth daily.    [provider]      Allergies    Patient has no known allergies.    Review of Systems   Review of Systems  Constitutional:  Positive for fever.  Neurological:  Positive for headaches.    Physical Exam Updated Vital Signs BP 134/72   Pulse 69   Temp 98.7 F (37.1 C) (Oral)   Resp (!) 23   Ht 5' (1.524 m)   Wt 64.4 kg   SpO2 94%   BMI 27.73 kg/m  Physical Exam Vitals and nursing note reviewed.  Constitutional:      General: She is not in acute distress.    Appearance: She is diaphoretic. She is not ill-appearing.  HENT:     Head: Atraumatic.  Eyes:     Extraocular Movements: Extraocular movements intact.     Conjunctiva/sclera: Conjunctivae normal.     Pupils: Pupils are equal, round, and reactive to light.  Cardiovascular:     Rate and Rhythm: Normal rate and regular rhythm.     Pulses: Normal pulses.     Heart sounds: No murmur heard. Pulmonary:  Effort: Pulmonary effort is normal. No respiratory distress.     Breath sounds: Normal breath sounds.  Abdominal:     General: Abdomen is flat. There is no distension.     Palpations: Abdomen is soft.     Tenderness: There is no abdominal tenderness.  Musculoskeletal:        General: Normal range of motion.     Cervical back: Normal range of motion.  Skin:    General: Skin is warm.     Capillary Refill: Capillary refill takes less than 2 seconds.  Neurological:     General: No focal deficit present.     Mental Status: She is alert.     Comments: Speech is clear, able to follow commands CN III-XII intact Normal strength in upper and lower extremities bilaterally including dorsiflexion and plantar flexion, strong and equal grip strength Subjective sensation of  the bilateral upper and lower extremities intact Moves extremities without ataxia, coordination intact Normal finger to nose and rapid alternating movements No pronator drift    Psychiatric:        Mood and Affect: Mood normal.     ED Results / Procedures / Treatments   Labs (all labs ordered are listed, but only abnormal results are displayed) Labs Reviewed  BASIC METABOLIC PANEL - Abnormal; Notable for the following components:      Result Value   Sodium 134 (*)    Glucose, Bld 140 (*)    Calcium 8.2 (*)    All other components within normal limits  CBC WITH DIFFERENTIAL/PLATELET - Abnormal; Notable for the following components:   WBC 1.9 (*)    Platelets 55 (*)    Neutro Abs 1.4 (*)    Lymphs Abs 0.3 (*)    All other components within normal limits  URINALYSIS, ROUTINE W REFLEX MICROSCOPIC - Abnormal; Notable for the following components:   APPearance CLOUDY (*)    Protein, ur 30 (*)    Leukocytes,Ua SMALL (*)    All other components within normal limits  URINALYSIS, MICROSCOPIC (REFLEX) - Abnormal; Notable for the following components:   Bacteria, UA MANY (*)    All other components within normal limits  HEPATIC FUNCTION PANEL - Abnormal; Notable for the following components:   Total Protein 5.7 (*)    Albumin 3.1 (*)    All other components within normal limits  RESP PANEL BY RT-PCR (FLU A&B, COVID) ARPGX2  ROCKY MTN SPOTTED FVR ABS PNL(IGG+IGM)  EHRLICHIA ANTIBODY PANEL  LYME DISEASE SEROLOGY W/REFLEX    EKG None  Radiology CT VENOGRAM HEAD  Result Date: 05/07/2022 CLINICAL DATA:  Headache for 2 days.  Fever. EXAM: CT VENOGRAM HEAD TECHNIQUE: Venographic phase images of the brain were obtained following the administration of intravenous contrast. Multiplanar reformats and maximum intensity projections were generated. RADIATION DOSE REDUCTION: This exam was performed according to the departmental dose-optimization program which includes automated exposure  control, adjustment of the mA and/or kV according to patient size and/or use of iterative reconstruction technique. CONTRAST:  OMNIPAQUE IOHEXOL 350 MG/ML SOLN COMPARISON:  MR head without and with contrast 07/24/2019. CTA head 05/07/2022. FINDINGS: The dural sinuses are patent. The straight sinus deep cerebral veins are intact. Cortical veins are within normal limits. No significant vascular malformation is evident. Right transverse sinus is dominant. No pathologic enhancement is present. IMPRESSION: Normal CT venogram of the head. Electronically Signed   By: Marin Roberts M.D.   On: 05/07/2022 13:27   CT Angio Head W or  Wo Contrast  Result Date: 05/07/2022 CLINICAL DATA:  Headache, sudden, severe. Right-sided headaches for 2 days. EXAM: CT ANGIOGRAPHY HEAD TECHNIQUE: Multidetector CT imaging of the head was performed using the standard protocol during bolus administration of intravenous contrast. Multiplanar CT image reconstructions and MIPs were obtained to evaluate the vascular anatomy. RADIATION DOSE REDUCTION: This exam was performed according to the departmental dose-optimization program which includes automated exposure control, adjustment of the mA and/or kV according to patient size and/or use of iterative reconstruction technique. CONTRAST:  OMNIPAQUE IOHEXOL 350 MG/ML SOLN COMPARISON:  CT head without contrast 06/16/2019. MR head without and with contrast 01/21/2019 FINDINGS: CT HEAD Brain: No acute infarct, hemorrhage, or mass lesion is present. Minimal white matter changes are stable, within normal limits for age. The ventricles are of normal size. No significant extraaxial fluid collection is present. The brainstem and cerebellum are within normal limits. Vascular: No hyperdense vessel or unexpected calcification. Skull: Calvarium is intact. No focal lytic or blastic lesions are present. No significant extracranial soft tissue lesion is present. Sinuses: The paranasal sinuses  and mastoid air cells are clear. Other: The globes and orbits are within normal limits. CTA HEAD Anterior circulation: Minimal calcifications are present within the cavernous internal carotid arteries bilaterally. No significant stenosis is present from the skull base through the ICA termini. The A1 and M1 segments are normal. MCA bifurcations are within normal limits. ACA and MCA branch vessels are normal. Posterior circulation: The vertebral arteries are codominant. PICA origins are visualized and normal. Vertebrobasilar junction and basilar artery are normal. Both posterior cerebral arteries originate from basilar tip. A prominent right posterior communicating artery contributes. The PCA branch vessels are within normal limits bilaterally. Venous sinuses: The dural sinuses are patent. The straight sinus deep cerebral veins are intact. Cortical veins are within normal limits. No significant vascular malformation is evident. Anatomic variants: None Review of the MIP images confirms the above findings. IMPRESSION: 1. Minimal atherosclerotic calcifications within the cavernous internal carotid arteries bilaterally without significant stenosis. 2. No significant proximal stenosis, aneurysm, or branch vessel occlusion within the Circle of Willis. 3. No acute intracranial abnormality. Normal noncontrast CT of the head for age. Electronically Signed   By: Marin Roberts M.D.   On: 05/07/2022 13:26    Procedures Procedures    Medications Ordered in ED Medications  iohexol (OMNIPAQUE) 350 MG/ML injection 100 mL (100 mLs Intravenous Contrast Given 05/07/22 1244)    ED Course/ Medical Decision Making/ A&P                           Medical Decision Making Amount and/or Complexity of Data Reviewed Labs: ordered. Radiology: ordered.  Risk Prescription drug management.   Social determinants of health:  Social History   Socioeconomic History   Marital status: Married    Spouse name: Not on file    Number of children: Not on file   Years of education: Not on file   Highest education level: Not on file  Occupational History   Not on file  Tobacco Use   Smoking status: Never   Smokeless tobacco: Never  Substance and Sexual Activity   Alcohol use: No   Drug use: Not on file   Sexual activity: Not on file  Other Topics Concern   Not on file  Social History Narrative   Not on file   Social Determinants of Health   Financial Resource Strain: Not on file  Food  Insecurity: Not on file  Transportation Needs: Not on file  Physical Activity: Not on file  Stress: Not on file  Social Connections: Not on file  Intimate Partner Violence: Not on file     Initial impression:  This patient presents to the ED for concern of Right-sided headache, this involves an extensive number of treatment options, and is a complaint that carries with it a high risk of complications and morbidity.   Differentials include migraine, tension headache, CVA, arterial occlusion, venous occlusion.   Comorbidities affecting care:  None  Additional history obtained: Daughter  Lab Tests  I Ordered, reviewed, and interpreted labs and EKG.  The pertinent results include:  CBC with white blood cell count 1.9 Hepatic function normal BMP unremarkable UA with evidence of infection Negative respiratory panel Pending Lyme, Ehrlichia and [place] spotted fever  Imaging Studies ordered:  I ordered imaging studies including  CT venogram of head negative CT angio of head negative I independently visualized and interpreted imaging and I agree with the radiologist interpretation.   EKG: Sinus rhythm  Cardiac Monitoring:  The patient was maintained on a cardiac monitor.  I personally viewed and interpreted the cardiac monitored which showed an underlying rhythm of: Sinus rhythm  Consultations Obtained:  My attending, Dr. Dalene Seltzer, consulted with hematology regarding patient's neutropenia and  discussed lab and imaging findings, particularly patient's reported fever yesterday.  He states that unless patient is undergoing chemotherapy, there is minimal concern of neutropenia with fever.  See Dr. Burr Medico note for more detail.   ED Course/Re-evaluation: 81 year old female presents to the ED for evaluation of right-sided headache.  Neuro exam normal.  Vitals without significant abnormality.  She is slightly tachypneic, however upon asking, patient states that she has mild shortness of breath at her baseline and this is unchanged in recent weeks.  Patient appears diaphoretic but physical exam was otherwise benign.  Labs concerning for urinary tract infection.  She is also neutropenic.  My attending, Dr. Dalene Seltzer is concerned about possible tickborne disease given patient's symptoms and neutropenia.  Plan to discharge home with doxycycline, Keflex for urinary tract infection and pending Lyme, early Kia and recommends finger panel.  Patient and daughter expressed understanding and are amenable to plan.  Disposition:  After consideration of the diagnostic results, physical exam, history and the patients response to treatment feel that the patent would benefit from discharge.   Acute non-intractable headache Acute cystitis without hematuria Neutropenia Thrombocytopenia: Plan and management as described above. Discharged home in good condition. Final Clinical Impression(s) / ED Diagnoses Final diagnoses:  Acute nonintractable headache, unspecified headache type  Acute cystitis without hematuria  Neutropenia, unspecified type (HCC)  Thrombocytopenia (HCC)    Rx / DC Orders ED Discharge Orders          Ordered    nitrofurantoin, macrocrystal-monohydrate, (MACROBID) 100 MG capsule  2 times daily,   Status:  Discontinued        05/07/22 1418    doxycycline (VIBRAMYCIN) 100 MG capsule  2 times daily        05/07/22 1449    cephALEXin (KEFLEX) 500 MG capsule  3 times daily         05/07/22 1449              Janell Quiet, [place] 05/07/22 1557    Alvira Monday, MD 05/07/22 2127

## 2022-05-07 NOTE — Discharge Instructions (Addendum)
Your work-up today was very reassuring.  CTs of your head were negative for stroke or any blood occlusion or bleeding.  Your urine did show signs of infection.  You can take ibuprofen or Tylenol for your headache.  We have also given you a prescription to treat tick-born illness (like ehrlichia) which can cause low white blood cells and low platelets.  We recommend follow up with your hematologist (or Dr. Myna Hidalgo above) and your primary care doctor. Please return for any new or worsening symptoms.

## 2022-05-09 LAB — ROCKY MTN SPOTTED FVR ABS PNL(IGG+IGM)
RMSF IgG: NEGATIVE
RMSF IgM: 0.32 index (ref 0.00–0.89)

## 2022-05-09 LAB — LYME DISEASE SEROLOGY W/REFLEX: Lyme Total Antibody EIA: NEGATIVE

## 2022-05-11 LAB — EHRLICHIA ANTIBODY PANEL
E chaffeensis (HGE) Ab, IgG: NEGATIVE
E chaffeensis (HGE) Ab, IgM: NEGATIVE
E. Chaffeensis (HME) IgM Titer: NEGATIVE
E.Chaffeensis (HME) IgG: NEGATIVE

## 2022-07-01 ENCOUNTER — Emergency Department (HOSPITAL_COMMUNITY): Payer: Medicare Other

## 2022-07-01 ENCOUNTER — Inpatient Hospital Stay (HOSPITAL_COMMUNITY): Payer: Medicare Other | Admitting: Anesthesiology

## 2022-07-01 ENCOUNTER — Inpatient Hospital Stay (HOSPITAL_COMMUNITY)
Admission: EM | Admit: 2022-07-01 | Discharge: 2022-07-03 | DRG: 481 | Disposition: A | Discharge status: Rehabilitation Facility | Payer: Medicare Other | Attending: Internal Medicine | Admitting: Internal Medicine

## 2022-07-01 ENCOUNTER — Inpatient Hospital Stay (HOSPITAL_COMMUNITY): Payer: Medicare Other

## 2022-07-01 DIAGNOSIS — W010XXA Fall on same level from slipping, tripping and stumbling without subsequent striking against object, initial encounter: Secondary | ICD-10-CM | POA: Diagnosis present

## 2022-07-01 DIAGNOSIS — R410 Disorientation, unspecified: Secondary | ICD-10-CM | POA: Diagnosis not present

## 2022-07-01 DIAGNOSIS — R339 Retention of urine, unspecified: Secondary | ICD-10-CM | POA: Diagnosis not present

## 2022-07-01 DIAGNOSIS — M7989 Other specified soft tissue disorders: Secondary | ICD-10-CM | POA: Diagnosis not present

## 2022-07-01 DIAGNOSIS — K449 Diaphragmatic hernia without obstruction or gangrene: Secondary | ICD-10-CM | POA: Diagnosis not present

## 2022-07-01 DIAGNOSIS — S72009A Fracture of unspecified part of neck of unspecified femur, initial encounter for closed fracture: Secondary | ICD-10-CM | POA: Diagnosis present

## 2022-07-01 DIAGNOSIS — F411 Generalized anxiety disorder: Secondary | ICD-10-CM | POA: Diagnosis present

## 2022-07-01 DIAGNOSIS — Z8249 Family history of ischemic heart disease and other diseases of the circulatory system: Secondary | ICD-10-CM | POA: Diagnosis not present

## 2022-07-01 DIAGNOSIS — Z90710 Acquired absence of both cervix and uterus: Secondary | ICD-10-CM

## 2022-07-01 DIAGNOSIS — D62 Acute posthemorrhagic anemia: Secondary | ICD-10-CM | POA: Diagnosis present

## 2022-07-01 DIAGNOSIS — K5900 Constipation, unspecified: Secondary | ICD-10-CM | POA: Diagnosis present

## 2022-07-01 DIAGNOSIS — D72829 Elevated white blood cell count, unspecified: Secondary | ICD-10-CM | POA: Diagnosis present

## 2022-07-01 DIAGNOSIS — H5500 Unspecified nystagmus: Secondary | ICD-10-CM | POA: Diagnosis present

## 2022-07-01 DIAGNOSIS — S72001D Fracture of unspecified part of neck of right femur, subsequent encounter for closed fracture with routine healing: Secondary | ICD-10-CM

## 2022-07-01 DIAGNOSIS — S72001A Fracture of unspecified part of neck of right femur, initial encounter for closed fracture: Principal | ICD-10-CM

## 2022-07-01 DIAGNOSIS — I10 Essential (primary) hypertension: Secondary | ICD-10-CM | POA: Diagnosis present

## 2022-07-01 DIAGNOSIS — E559 Vitamin D deficiency, unspecified: Secondary | ICD-10-CM | POA: Diagnosis not present

## 2022-07-01 DIAGNOSIS — Y92011 Dining room of single-family (private) house as the place of occurrence of the external cause: Secondary | ICD-10-CM | POA: Diagnosis not present

## 2022-07-01 DIAGNOSIS — R531 Weakness: Secondary | ICD-10-CM | POA: Diagnosis present

## 2022-07-01 DIAGNOSIS — D649 Anemia, unspecified: Secondary | ICD-10-CM | POA: Diagnosis not present

## 2022-07-01 DIAGNOSIS — Z79899 Other long term (current) drug therapy: Secondary | ICD-10-CM

## 2022-07-01 DIAGNOSIS — S72001S Fracture of unspecified part of neck of right femur, sequela: Secondary | ICD-10-CM | POA: Diagnosis not present

## 2022-07-01 DIAGNOSIS — B952 Enterococcus as the cause of diseases classified elsewhere: Secondary | ICD-10-CM | POA: Diagnosis not present

## 2022-07-01 DIAGNOSIS — I159 Secondary hypertension, unspecified: Secondary | ICD-10-CM | POA: Diagnosis not present

## 2022-07-01 DIAGNOSIS — R600 Localized edema: Secondary | ICD-10-CM | POA: Diagnosis not present

## 2022-07-01 DIAGNOSIS — D72823 Leukemoid reaction: Secondary | ICD-10-CM | POA: Diagnosis not present

## 2022-07-01 DIAGNOSIS — F429 Obsessive-compulsive disorder, unspecified: Secondary | ICD-10-CM | POA: Diagnosis present

## 2022-07-01 DIAGNOSIS — N390 Urinary tract infection, site not specified: Secondary | ICD-10-CM | POA: Diagnosis not present

## 2022-07-01 DIAGNOSIS — R4189 Other symptoms and signs involving cognitive functions and awareness: Secondary | ICD-10-CM | POA: Diagnosis present

## 2022-07-01 DIAGNOSIS — M8448XD Pathological fracture, other site, subsequent encounter for fracture with routine healing: Secondary | ICD-10-CM | POA: Diagnosis present

## 2022-07-01 DIAGNOSIS — F32A Depression, unspecified: Secondary | ICD-10-CM | POA: Diagnosis present

## 2022-07-01 DIAGNOSIS — S72141S Displaced intertrochanteric fracture of right femur, sequela: Secondary | ICD-10-CM | POA: Diagnosis not present

## 2022-07-01 DIAGNOSIS — K5901 Slow transit constipation: Secondary | ICD-10-CM | POA: Diagnosis not present

## 2022-07-01 DIAGNOSIS — S72141D Displaced intertrochanteric fracture of right femur, subsequent encounter for closed fracture with routine healing: Secondary | ICD-10-CM | POA: Diagnosis present

## 2022-07-01 DIAGNOSIS — S72141A Displaced intertrochanteric fracture of right femur, initial encounter for closed fracture: Secondary | ICD-10-CM | POA: Diagnosis present

## 2022-07-01 DIAGNOSIS — S7291XD Unspecified fracture of right femur, subsequent encounter for closed fracture with routine healing: Secondary | ICD-10-CM | POA: Diagnosis not present

## 2022-07-01 DIAGNOSIS — F419 Anxiety disorder, unspecified: Secondary | ICD-10-CM | POA: Diagnosis present

## 2022-07-01 DIAGNOSIS — Z7983 Long term (current) use of bisphosphonates: Secondary | ICD-10-CM | POA: Diagnosis not present

## 2022-07-01 DIAGNOSIS — R55 Syncope and collapse: Secondary | ICD-10-CM | POA: Diagnosis not present

## 2022-07-01 DIAGNOSIS — G4700 Insomnia, unspecified: Secondary | ICD-10-CM | POA: Diagnosis present

## 2022-07-01 DIAGNOSIS — W010XXD Fall on same level from slipping, tripping and stumbling without subsequent striking against object, subsequent encounter: Secondary | ICD-10-CM | POA: Diagnosis present

## 2022-07-01 LAB — CBC WITH DIFFERENTIAL/PLATELET
Abs Immature Granulocytes: 0.04 10*3/uL (ref 0.00–0.07)
Basophils Absolute: 0 10*3/uL (ref 0.0–0.1)
Basophils Relative: 0 %
Eosinophils Absolute: 0 10*3/uL (ref 0.0–0.5)
Eosinophils Relative: 0 %
HCT: 34.4 % — ABNORMAL LOW (ref 36.0–46.0)
Hemoglobin: 10.7 g/dL — ABNORMAL LOW (ref 12.0–15.0)
Immature Granulocytes: 0 %
Lymphocytes Relative: 9 %
Lymphs Abs: 1 10*3/uL (ref 0.7–4.0)
MCH: 27 pg (ref 26.0–34.0)
MCHC: 31.1 g/dL (ref 30.0–36.0)
MCV: 86.6 fL (ref 80.0–100.0)
Monocytes Absolute: 0.9 10*3/uL (ref 0.1–1.0)
Monocytes Relative: 8 %
Neutro Abs: 9 10*3/uL — ABNORMAL HIGH (ref 1.7–7.7)
Neutrophils Relative %: 83 %
Platelets: 209 10*3/uL (ref 150–400)
RBC: 3.97 MIL/uL (ref 3.87–5.11)
RDW: 13.5 % (ref 11.5–15.5)
WBC: 10.9 10*3/uL — ABNORMAL HIGH (ref 4.0–10.5)
nRBC: 0 % (ref 0.0–0.2)

## 2022-07-01 LAB — BASIC METABOLIC PANEL
Anion gap: 6 (ref 5–15)
BUN: 9 mg/dL (ref 8–23)
CO2: 25 mmol/L (ref 22–32)
Calcium: 7.9 mg/dL — ABNORMAL LOW (ref 8.9–10.3)
Chloride: 108 mmol/L (ref 98–111)
Creatinine, Ser: 0.68 mg/dL (ref 0.44–1.00)
GFR, Estimated: >60 mL/min (ref >60)
Glucose, Bld: 137 mg/dL — ABNORMAL HIGH (ref 70–99)
Potassium: 4.3 mmol/L (ref 3.5–5.1)
Sodium: 139 mmol/L (ref 135–145)

## 2022-07-01 LAB — PROTIME-INR
INR: 1.1 (ref 0.8–1.2)
Prothrombin Time: 14.2 seconds (ref 11.4–15.2)

## 2022-07-01 LAB — TYPE AND SCREEN
ABO/RH(D): A POS
Antibody Screen: NEGATIVE

## 2022-07-01 LAB — CK: Total CK: 205 U/L (ref 38–234)

## 2022-07-01 LAB — APTT: aPTT: 21 seconds — ABNORMAL LOW (ref 24–36)

## 2022-07-01 MED ORDER — HYDROMORPHONE HCL 1 MG/ML IJ SOLN
0.5000 mg | INTRAMUSCULAR | Status: DC | PRN
  Administered 2022-07-02: 0.5 mg via INTRAVENOUS
  Filled 2022-07-01: qty 0.5

## 2022-07-01 MED ORDER — CITALOPRAM HYDROBROMIDE 20 MG PO TABS
20.0000 mg | ORAL_TABLET | Freq: Every day | ORAL | Status: DC
  Administered 2022-07-01 – 2022-07-03 (×2): 20 mg via ORAL
  Filled 2022-07-01 (×2): qty 1

## 2022-07-01 MED ORDER — BISACODYL 5 MG PO TBEC: 5.0000 mg | DELAYED_RELEASE_TABLET | Freq: Every day | ORAL | Status: DC | PRN

## 2022-07-01 MED ORDER — SENNOSIDES-DOCUSATE SODIUM 8.6-50 MG PO TABS: 1.0000 | ORAL_TABLET | Freq: Every evening | ORAL | Status: DC | PRN

## 2022-07-01 MED ORDER — PANTOPRAZOLE SODIUM 20 MG PO TBEC
20.0000 mg | DELAYED_RELEASE_TABLET | Freq: Every day | ORAL | Status: DC
  Administered 2022-07-01 – 2022-07-03 (×2): 20 mg via ORAL
  Filled 2022-07-01 (×2): qty 1

## 2022-07-01 MED ORDER — ONDANSETRON HCL 4 MG/2ML IJ SOLN
4.0000 mg | Freq: Once | INTRAMUSCULAR | Status: AC
  Administered 2022-07-01: 4 mg via INTRAVENOUS
  Filled 2022-07-01: qty 2

## 2022-07-01 MED ORDER — ENOXAPARIN SODIUM 40 MG/0.4ML IJ SOSY
40.0000 mg | PREFILLED_SYRINGE | INTRAMUSCULAR | Status: DC
  Administered 2022-07-01: 40 mg via SUBCUTANEOUS
  Filled 2022-07-01: qty 0.4

## 2022-07-01 MED ORDER — MORPHINE SULFATE (PF) 4 MG/ML IV SOLN
4.0000 mg | INTRAVENOUS | Status: AC
  Administered 2022-07-01: 4 mg via INTRAVENOUS
  Filled 2022-07-01: qty 1

## 2022-07-01 MED ORDER — METOPROLOL TARTRATE 12.5 MG HALF TABLET
12.5000 mg | ORAL_TABLET | Freq: Two times a day (BID) | ORAL | Status: DC
  Administered 2022-07-01: 12.5 mg via ORAL
  Filled 2022-07-01: qty 1

## 2022-07-01 MED ORDER — LORATADINE 10 MG PO TABS
10.0000 mg | ORAL_TABLET | Freq: Every day | ORAL | Status: DC
  Administered 2022-07-01 – 2022-07-03 (×2): 10 mg via ORAL
  Filled 2022-07-01 (×2): qty 1

## 2022-07-01 MED ORDER — LISINOPRIL 10 MG PO TABS: 10.0000 mg | ORAL_TABLET | Freq: Every day | ORAL | Status: DC

## 2022-07-01 MED ORDER — FERROUS SULFATE 325 (65 FE) MG PO TABS
325.0000 mg | ORAL_TABLET | Freq: Every day | ORAL | Status: DC
  Administered 2022-07-01 – 2022-07-03 (×2): 325 mg via ORAL
  Filled 2022-07-01 (×2): qty 1

## 2022-07-01 MED ORDER — MORPHINE SULFATE (PF) 4 MG/ML IV SOLN
4.0000 mg | Freq: Once | INTRAVENOUS | Status: AC
  Administered 2022-07-01: 4 mg via INTRAVENOUS
  Filled 2022-07-01: qty 1

## 2022-07-01 MED ORDER — ROPIVACAINE HCL 5 MG/ML IJ SOLN
INTRAMUSCULAR | Status: DC | PRN
  Administered 2022-07-01: 20 mL via PERINEURAL

## 2022-07-01 MED ORDER — ACETAMINOPHEN 500 MG PO TABS
1000.0000 mg | ORAL_TABLET | ORAL | Status: DC
  Filled 2022-07-01: qty 2

## 2022-07-01 MED ORDER — HYDROCODONE-ACETAMINOPHEN 5-325 MG PO TABS: 1.0000 | ORAL_TABLET | Freq: Four times a day (QID) | ORAL | Status: DC | PRN

## 2022-07-01 MED ORDER — ACETAMINOPHEN 500 MG PO TABS
1000.0000 mg | ORAL_TABLET | Freq: Four times a day (QID) | ORAL | Status: DC | PRN
Start: 2022-07-01 — End: 2022-07-03
  Administered 2022-07-01 – 2022-07-03 (×3): 1000 mg via ORAL
  Filled 2022-07-01 (×3): qty 2

## 2022-07-01 NOTE — ED Notes (Signed)
Patient transported to CT 

## 2022-07-01 NOTE — Anesthesia Preprocedure Evaluation (Addendum)
Anesthesia Evaluation  Patient identified by MRN, date of birth, ID band Patient awake    Reviewed: Allergy & Precautions, NPO status , Patient's Chart, lab work & pertinent test results  History of Anesthesia Complications Negative for: history of anesthetic complications  Airway Mallampati: II  TM Distance: >3 FB Neck ROM: Full    Dental no notable dental hx. (+) Dental Advisory Given   Pulmonary neg pulmonary ROS,    Pulmonary exam normal        Cardiovascular negative cardio ROS Normal cardiovascular exam     Neuro/Psych  Headaches, PSYCHIATRIC DISORDERS Anxiety    GI/Hepatic Neg liver ROS, hiatal hernia,   Endo/Other  negative endocrine ROS  Renal/GU negative Renal ROS     Musculoskeletal negative musculoskeletal ROS (+)   Abdominal   Peds  Hematology  (+) Blood dyscrasia, anemia ,   Anesthesia Other Findings   Reproductive/Obstetrics                            Anesthesia Physical Anesthesia Plan  ASA: 3  Anesthesia Plan: General   Post-op Pain Management: Celebrex PO (pre-op)* and Tylenol PO (pre-op)*   Induction: Intravenous  PONV Risk Score and Plan: 4 or greater and Ondansetron, Dexamethasone, Diphenhydramine and Treatment may vary due to age or medical condition  Airway Management Planned: LMA  Additional Equipment:   Intra-op Plan:   Post-operative Plan: Extubation in OR  Informed Consent: I have reviewed the patients History and Physical, chart, labs and discussed the procedure including the risks, benefits and alternatives for the proposed anesthesia with the patient or authorized representative who has indicated his/her understanding and acceptance.     Dental advisory given  Plan Discussed with: Anesthesiologist  Anesthesia Plan Comments:        Anesthesia Quick Evaluation

## 2022-07-01 NOTE — H&P (Signed)
History and Physical    Amber Ruiz [id number] DOB: 1941-05-01 DOA: 07/01/2022  PCP: Charleston Poot, MD (Confirm with patient/family/NH records and if not entered, this has to be entered at [facility] point of entry) Patient coming from: Home  I have personally briefly reviewed patient's old medical records in [place]  Chief Complaint: I fell  HPI: Amber Ruiz is a 81 y.o. female with medical history significant of auricles, HTN, OCD, anxiety/depression, presented with fall.  Patient reported that "not feeling well for 2 days". But when I asked her to specify her problem, she could not tell me more. Last night, after watching TV show around 2 AM, patient was standing up, but while she was trying to reach trash can, she fell backward hit her backside of the head and right hip and knee.  No LOC.  Could not get up because excruciating pain she stayed on the floor through the night.  Denies any new onset of numbness weakness of any of the limbs.  She could not tell me whether any of his symptoms resemble her past flareup of vertigos, but she reported that she has not had any vertigo episode at least in couple years.  ED Course: Medical stable, no tachycardia no hypotension afebrile.  Trauma scan showed right comminuted inter-trochanter fracture.  Review of Systems: As per HPI otherwise 14 point review of systems negative.   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 She has never used smokeless tobacco. She reports that she does not drink alcohol. No history on file for drug use.  Not on File  No family history on file.   Prior to Admission medications   Medication Sig Start Date End Date Taking? Authorizing  Provider  acetaminophen (TYLENOL) 500 MG tablet Take 1,000 mg by mouth every 6 (six) hours as needed for mild pain.   Yes [provider]  alendronate (FOSAMAX) 70 MG tablet Take 70 mg by mouth once a week. 01/14/18  Yes [provider]  cetirizine (ZYRTEC) 5 MG tablet Take 5 mg by mouth daily.   Yes [provider]  citalopram (CELEXA) 40 MG tablet Take 20 mg by mouth daily.    Yes [provider]  FEROSUL 325 (65 Fe) MG tablet Take 325 mg by mouth daily. 06/28/22  Yes [provider]  lisinopril (ZESTRIL) 10 MG tablet Take 10 mg by mouth daily.   Yes [provider]  pantoprazole (PROTONIX) 20 MG tablet Take 20 mg by mouth daily.   Yes [provider]    Physical Exam: Vitals:   07/01/22 1711 07/01/22 1728 07/01/22 1755 07/01/22 1758  BP:  (!) 141/56    Pulse:  78 75 81  Resp:  16 18 (!) 22  Temp: 98.2 F (36.8 C)     TempSrc: Oral     SpO2:  96% 96% 95%    Constitutional: NAD, calm, comfortable Vitals:   07/01/22 1711 07/01/22 1728 07/01/22 1755 07/01/22 1758  BP:  (!) 141/56    Pulse:  78 75 81  Resp:  16 18 (!) 22  Temp: 98.2 F (36.8 C)     TempSrc: Oral  SpO2:  96% 96% 95%   Eyes: PERRL, lids and conjunctivae normal ENMT: Mucous membranes are moist. Posterior pharynx clear of any exudate or lesions.Normal dentition.  Neck: normal, supple, no masses, no thyromegaly Respiratory: clear to auscultation bilaterally, no wheezing, no crackles. Normal respiratory effort. No accessory muscle use.  Cardiovascular: Regular rate and rhythm, no murmurs / rubs / gallops. No extremity edema. 2+ pedal pulses. No carotid bruits.  Abdomen: no tenderness, no masses palpated. No hepatosplenomegaly. Bowel sounds positive.  Musculoskeletal: no clubbing / cyanosis.  Shortening and rotation of right leg, tenderness on right hip with creased ROM Skin: no rashes, lesions, ulcers. No induration Neurologic: CN 2-12 grossly intact.  Sensation intact, DTR normal. Strength 5/5 in all 4.  Psychiatric: Normal judgment and insight. Alert and oriented x 3. Normal mood.     Labs on Admission: I have personally reviewed following labs and imaging studies  CBC: Recent Labs  Lab 07/01/22 1539  WBC 10.9*  NEUTROABS 9.0*  HGB 10.7*  HCT 34.4*  MCV 86.6  PLT 209   Basic Metabolic Panel: Recent Labs  Lab 07/01/22 1539  NA 139  K 4.3  CL 108  CO2 25  GLUCOSE 137*  BUN 9  CREATININE 0.68  CALCIUM 7.9*   GFR: CrCl cannot be calculated (Unknown ideal weight.). Liver Function Tests: No results for input(s): "AST", "ALT", "ALKPHOS", "BILITOT", "PROT", "ALBUMIN" in the last 168 hours. No results for input(s): "LIPASE", "AMYLASE" in the last 168 hours. No results for input(s): "AMMONIA" in the last 168 hours. Coagulation Profile: Recent Labs  Lab 07/01/22 1539  INR 1.1   Cardiac Enzymes: Recent Labs  Lab 07/01/22 1539  CKTOTAL 205   BNP (last 3 results) No results for input(s): "PROBNP" in the last 8760 hours. HbA1C: No results for input(s): "HGBA1C" in the last 72 hours. CBG: No results for input(s): "GLUCAP" in the last 168 hours. Lipid Profile: No results for input(s): "CHOL", "HDL", "LDLCALC", "TRIG", "CHOLHDL", "LDLDIRECT" in the last 72 hours. Thyroid Function Tests: No results for input(s): "TSH", "T4TOTAL", "FREET4", "T3FREE", "THYROIDAB" in the last 72 hours. Anemia Panel: No results for input(s): "VITAMINB12", "FOLATE", "FERRITIN", "TIBC", "IRON", "RETICCTPCT" in the last 72 hours. Urine analysis:    Component Value Date/Time   COLORURINE YELLOW 05/07/2022 1151   APPEARANCEUR CLOUDY (A) 05/07/2022 1151   LABSPEC 1.020 05/07/2022 1151   PHURINE 6.0 05/07/2022 1151   GLUCOSEU NEGATIVE 05/07/2022 1151   HGBUR NEGATIVE 05/07/2022 1151   BILIRUBINUR NEGATIVE 05/07/2022 1151   KETONESUR NEGATIVE 05/07/2022 1151   PROTEINUR 30 (A) 05/07/2022 1151   NITRITE NEGATIVE 05/07/2022 1151    LEUKOCYTESUR SMALL (A) 05/07/2022 1151    Radiological Exams on Admission: CT HEAD WO CONTRAST  Result Date: 07/01/2022 CLINICAL DATA:  81 year old female with headache and neck pain following fall. EXAM: CT HEAD WITHOUT CONTRAST CT CERVICAL SPINE WITHOUT CONTRAST TECHNIQUE: Multidetector CT imaging of the head and cervical spine was performed following the standard protocol without intravenous contrast. Multiplanar CT image reconstructions of the cervical spine were also generated. RADIATION DOSE REDUCTION: This exam was performed according to the departmental dose-optimization program which includes automated exposure control, adjustment of the mA and/or kV according to patient size and/or use of iterative reconstruction technique. COMPARISON:  05/07/2022 CTA head, 07/24/2019 brain MR, 03/24/2012 CT cervical spine and prior studies FINDINGS: CT HEAD FINDINGS Brain: No evidence of acute infarction, hemorrhage, hydrocephalus, extra-axial collection or mass lesion/mass effect. Atrophy and chronic small-vessel white matter ischemic changes again noted.  Vascular: Carotid and vertebral atherosclerotic calcifications are noted. Skull: Normal. Negative for fracture or focal lesion. Sinuses/Orbits: No acute finding. Other: RIGHT posterior scalp soft tissue swelling is noted. CT CERVICAL SPINE FINDINGS Alignment: Normal. Skull base and vertebrae: No acute fracture. 30% compression of C7 is unchanged from 2013. No primary bone lesion or focal pathologic process. Soft tissues and spinal canal: No prevertebral fluid or swelling. No visible canal hematoma. 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 Upper chest: No acute abnormality. Other: None IMPRESSION: 1. No evidence of acute intracranial abnormality. Atrophy and chronic small-vessel white matter ischemic changes. 2. RIGHT posterior scalp soft tissue swelling without fracture. 3. No static evidence of acute injury to the cervical spine.  Electronically Signed   By: Margarette Canada M.D.   On: 07/01/2022 16:19   CT CERVICAL SPINE WO CONTRAST  Result Date: 07/01/2022 CLINICAL DATA:  81 year old female with headache and neck pain following fall. EXAM: CT HEAD WITHOUT CONTRAST CT CERVICAL SPINE WITHOUT CONTRAST TECHNIQUE: Multidetector CT imaging of the head and cervical spine was performed following the standard protocol without intravenous contrast. Multiplanar CT image reconstructions of the cervical spine were also generated. RADIATION DOSE REDUCTION: This exam was performed according to the departmental dose-optimization program which includes automated exposure control, adjustment of the mA and/or kV according to patient size and/or use of iterative reconstruction technique. COMPARISON:  05/07/2022 CTA head, 07/24/2019 brain MR, 03/24/2012 CT cervical spine and prior studies FINDINGS: CT HEAD FINDINGS Brain: No evidence of acute infarction, hemorrhage, hydrocephalus, extra-axial collection or mass lesion/mass effect. Atrophy and chronic small-vessel white matter ischemic changes again noted. Vascular: Carotid and vertebral atherosclerotic calcifications are noted. Skull: Normal. Negative for fracture or focal lesion. Sinuses/Orbits: No acute finding. Other: RIGHT posterior scalp soft tissue swelling is noted. CT CERVICAL SPINE FINDINGS Alignment: Normal. Skull base and vertebrae: No acute fracture. 30% compression of C7 is unchanged from 2013. No primary bone lesion or focal pathologic process. Soft tissues and spinal canal: No prevertebral fluid or swelling. No visible canal hematoma. 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 Upper chest: No acute abnormality. Other: None IMPRESSION: 1. No evidence of acute intracranial abnormality. Atrophy and chronic small-vessel white matter ischemic changes. 2. RIGHT posterior scalp soft tissue swelling without fracture. 3. No static evidence of acute injury to the cervical  spine. Electronically Signed   By: Margarette Canada M.D.   On: 07/01/2022 16:19   DG Knee 2 Views Right  Result Date: 07/01/2022 CLINICAL DATA:  Trauma, fall EXAM: RIGHT KNEE - 1-2 VIEW COMPARISON:  None Available. FINDINGS: No displaced fracture is seen. There is no significant effusion in suprapatellar bursa. In the AP view, there is faint vertically oriented lucency in the lateral aspect of distal femur which could not be distinctly seen in the lateral view. IMPRESSION: No displaced fracture is seen in right knee. There is no significant effusion. There are faint linear lucencies in the lateral aspect of distal femur seen only in the AP projection. This may be an artifact or suggest undisplaced fracture. Please correlate with clinical physical examination findings and consider four view examination of right knee if clinically warranted. Electronically Signed   By: Elmer Picker M.D.   On: 07/01/2022 15:45   DG Hip Unilat With Pelvis 2-3 Views Right  Result Date: 07/01/2022 CLINICAL DATA:  Trauma, fall EXAM: DG HIP (WITH OR WITHOUT PELVIS) 2-3V RIGHT COMPARISON:  None Available. FINDINGS: Comminuted intertrochanteric fracture is seen in proximal right femur.  There is overriding of fracture fragments. There is medial displacement of lesser trochanter. There is no dislocation. IMPRESSION: Comminuted, displaced intertrochanteric fracture is seen in proximal right femur. Electronically Signed   By: Ernie Avena M.D.   On: 07/01/2022 15:41   DG FEMUR, MIN 2 VIEWS RIGHT  Result Date: 07/01/2022 CLINICAL DATA:  Trauma, fall EXAM: RIGHT FEMUR 2 VIEWS COMPARISON:  None Available. FINDINGS: Comminuted intertrochanteric fracture is seen proximal right femur. There is superior displacement of distal major fracture fragment. There is no dislocation. IMPRESSION: Comminuted, displaced intertrochanteric fracture is seen in proximal right femur. Electronically Signed   By: Ernie Avena M.D.   On:  07/01/2022 15:40   DG Pelvis 1-2 Views  Result Date: 07/01/2022 CLINICAL DATA:  Pain after fall EXAM: PELVIS - 1-2 VIEW COMPARISON:  None Available. FINDINGS: There is a comminuted angulated/displaced right intertrochanteric femoral fracture. IMPRESSION: Comminuted angulated/displaced right intertrochanteric hip fracture. Electronically Signed   By: Gerome Sam III M.D.   On: 07/01/2022 14:10    EKG: Independently reviewed.  Sinus rhythm, prolonged QTc 566  Assessment/Plan Principal Problem:   Hip fracture (HCC) Active Problems:   Closed right hip fracture (HCC)  (please populate well all problems here in Problem List. (For example, if patient is on BP meds at home and you resume or decide to hold them, it is a problem that needs to be her. Same for CAD, COPD, HLD and so on)   Fall -Question of near syncope -Work-up to include, echocardiogram, telemetry monitoring.  EKG showed prolonged QTc 566, review hold medications she is on Celexa chronically.  Repeat EKG tomorrow.  Right hip fracture -ORIF tomorrow -Anesthesiology called for nerve block -Epicillin active, tolerates 4 METS activity, medically cleared for tomorrow's ORIF surgery with general anesthesia with acceptable risk.  Acute blood loss anemia -Secondary to hip fracture -Type and screen -Recheck CBC tomorrow  Hx of Vertigo -As per patient, her symptoms not compatible with her past flareup of vertigo. -Monitoring  HTN -Change ACEI to beta-blocker for incoming surgery, recheck EKG tomorrow.  DVT prophylaxis: Lovenox Code Status: Full code Family Communication: None at bedside Disposition Plan: Patient sick with hip fracture requiring ORIF, expect more than 2 midnight hospital stay. Consults called: Orthopedic surgery Admission status: MedSurg admission with telemetry monitoring   Emeline General MD [facility] Pager [phone]  07/01/2022, 6:29 PM

## 2022-07-01 NOTE — ED Notes (Signed)
Patient transported to PACU via PACU staff. Family called and updated.

## 2022-07-01 NOTE — ED Triage Notes (Signed)
Pt BIB [facility] from home C/O fall last night around 2200. Per EMS, pt was still on floor when they got to her. Pt with hematoma to back of head, R hip pain with shortening noted.

## 2022-07-01 NOTE — Anesthesia Procedure Notes (Signed)
Anesthesia Regional Block: Femoral nerve block   Pre-Anesthetic Checklist: , timeout performed,  Correct Patient, Correct Site, Correct Laterality,  Correct Procedure, Correct Position, site marked,  Risks and benefits discussed,  Surgical consent,  Pre-op evaluation,  At surgeon's request and post-op pain management  Laterality: Right  Prep: chloraprep       Needles:  Injection technique: Single-shot  Needle Type: Echogenic Needle     Needle Length: 9cm  Needle Gauge: 21     Additional Needles:   Procedures:,,,, ultrasound used (permanent image in chart),,    Narrative:  Start time: 07/01/2022 3:46 PM End time: 07/01/2022 5:52 PM Injection made incrementally with aspirations every 5 mL.  Performed by: Personally  Anesthesiologist: Collene Schlichter, MD  Additional Notes: No pain on injection. No increased resistance to injection. Injection made in 5cc increments.  Good needle visualization.  Patient tolerated procedure well.

## 2022-07-01 NOTE — Progress Notes (Addendum)
Ortho Trauma Note  Received consult regarding this patient. She has a right intertrochanteric femur fracture. Will recommend cephalomedullary nailing tomorrow AM. Formal consult to follow. I have reached out to her son and have discussed this with him over the phone.  Roby Lofts, MD [facility] [phone] (office) [email or web address]

## 2022-07-01 NOTE — ED Notes (Signed)
Patient in stretcher as comfortable as possible. EMS gave of fentanyl PTA and applied sheet to pelvis. Family at bedside as well. Patient denies any LOC. Patient placed on monitor.

## 2022-07-01 NOTE — ED Notes (Addendum)
Patient transported to x-ray. ?

## 2022-07-01 NOTE — ED Notes (Signed)
Hospital bed requested for patient. Ortho tech advised to call back once patient is in bed to apply traction.

## 2022-07-01 NOTE — ED Notes (Signed)
ED TO INPATIENT HANDOFF REPORT  ED Nurse Name and Phone #: [phone]  S Name/Age/Gender Amber Ruiz 81 y.o. female Room/Bed: 014C/014C  Code Status   Code Status: Full Code  Home/SNF/Other Home Patient oriented to: self, place, time, and situation Is this baseline? Yes   Triage Complete: Triage complete  Chief Complaint Hip fracture (HCC) [S72.009A]  Triage Note Pt BIB [facility] from home C/O fall last night around 2200. Per EMS, pt was still on floor when they got to her. Pt with hematoma to back of head, R hip pain with shortening noted.     Allergies No Known Allergies  Level of Care/Admitting Diagnosis ED Disposition     ED Disposition  Admit   Condition  --   Comment  Hospital Area: [facility] [[id number]]  Level of Care: Med-Surg [16]  May admit patient to [facility] or [facility] if equivalent level of care is available:: No  Covid Evaluation: Asymptomatic - no recent exposure (last 10 days) testing not required  Diagnosis: Hip fracture [facility]) [[id number]]  Admitting Physician: [facility] General [[id number]]  Attending Physician: [facility] General [[id number]]  Certification:: I certify this patient will need inpatient services for at least 2 midnights  Estimated Length of Stay: 3          B Medical/Surgery History Past Medical History:  Diagnosis Date   Anemia    Anxiety    Hiatal hernia    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   OCD (obsessive compulsive disorder)    Past Surgical History:  Procedure Laterality Date   ABDOMINAL HYSTERECTOMY     COLONOSCOPY       A IV Location/Drains/Wounds Patient Lines/Drains/Airways Status     Active Line/Drains/Airways     Name Placement date Placement time Site Days   Peripheral IV 07/01/22 20 G Left Antecubital 07/01/22  --  Antecubital  less than 1   External Urinary Catheter 07/01/22  1729  --  less than 1            Intake/Output Last 24 hours No intake or output data in the 24  hours ending 07/01/22 1748  Labs/Imaging Results for orders placed or performed during the hospital encounter of 07/01/22 (from the past 48 hour(s))  Type and screen [facility]     Status: None   Collection Time: 07/01/22  3:38 PM  Result Value Ref Range   ABO/RH(D) A POS    Antibody Screen NEG    Sample Expiration      07/04/2022,2359 Performed at [facility], [address], [place], [place] [address]   Basic metabolic panel     Status: Abnormal   Collection Time: 07/01/22  3:39 PM  Result Value Ref Range   Sodium 139 135 - 145 mmol/L   Potassium 4.3 3.5 - 5.1 mmol/L   Chloride 108 98 - 111 mmol/L   CO2 25 22 - 32 mmol/L   Glucose, Bld 137 (H) 70 - 99 mg/dL    Comment: Glucose reference range applies only to samples taken after fasting for at least 8 hours.   BUN 9 8 - 23 mg/dL   Creatinine, Ser 8.56 0.44 - 1.00 mg/dL   Calcium 7.9 (L) 8.9 - 10.3 mg/dL   GFR, Estimated >31 >49 mL/min    Comment: (NOTE) Calculated using the CKD-EPI Creatinine Equation (2021)    Anion gap 6 5 - 15    Comment: Performed at [facility], [address] [place], [place] [address]  CBC with Differential     Status: Abnormal   Collection Time: 07/01/22  3:39 PM  Result Value Ref Range   WBC 10.9 (H) 4.0 - 10.5 K/uL   RBC 3.97 3.87 - 5.11 MIL/uL   Hemoglobin 10.7 (L) 12.0 - 15.0 g/dL   HCT 87.5 (L) 64.3 - 32.9 %   MCV 86.6 80.0 - 100.0 fL   MCH 27.0 26.0 - 34.0 pg   MCHC 31.1 30.0 - 36.0 g/dL   RDW 51.8 84.1 - 66.0 %   Platelets 209 150 - 400 K/uL   nRBC 0.0 0.0 - 0.2 %   Neutrophils Relative % 83 %   Neutro Abs 9.0 (H) 1.7 - 7.7 K/uL   Lymphocytes Relative 9 %   Lymphs Abs 1.0 0.7 - 4.0 K/uL   Monocytes Relative 8 %   Monocytes Absolute 0.9 0.1 - 1.0 K/uL   Eosinophils Relative 0 %   Eosinophils Absolute 0.0 0.0 - 0.5 K/uL   Basophils Relative 0 %   Basophils Absolute 0.0 0.0 - 0.1 K/uL   Immature Granulocytes 0 %   Abs Immature Granulocytes 0.04 0.00  - 0.07 K/uL    Comment: Performed at [facility], [address], [place], [place] [address]  Protime-INR     Status: None   Collection Time: 07/01/22  3:39 PM  Result Value Ref Range   Prothrombin Time 14.2 11.4 - 15.2 seconds   INR 1.1 0.8 - 1.2    Comment: (NOTE) INR goal varies based on device and disease states. Performed at [facility], [address] [place], [place] [address]   APTT     Status: Abnormal   Collection Time: 07/01/22  3:39 PM  Result Value Ref Range   aPTT 21 (L) 24 - 36 seconds    Comment: Performed at [facility], [address], [place], [place] [address]  CK     Status: None   Collection Time: 07/01/22  3:39 PM  Result Value Ref Range   Total CK 205 38 - 234 U/L    Comment: Performed at [facility], [address] [place], [place] [address]   CT HEAD WO CONTRAST  Result Date: 07/01/2022 CLINICAL DATA:  81 year old female with headache and neck pain following fall. EXAM: CT HEAD WITHOUT CONTRAST CT CERVICAL SPINE WITHOUT CONTRAST TECHNIQUE: Multidetector CT imaging of the head and cervical spine was performed following the standard protocol without intravenous contrast. Multiplanar CT image reconstructions of the cervical spine were also generated. RADIATION DOSE REDUCTION: This exam was performed according to the departmental dose-optimization program which includes automated exposure control, adjustment of the mA and/or kV according to patient size and/or use of iterative reconstruction technique. COMPARISON:  05/07/2022 CTA head, 07/24/2019 brain MR, 03/24/2012 CT cervical spine and prior studies FINDINGS: CT HEAD FINDINGS Brain: No evidence of acute infarction, hemorrhage, hydrocephalus, extra-axial collection or mass lesion/mass effect. Atrophy and chronic small-vessel white matter ischemic changes again noted. Vascular: Carotid and vertebral atherosclerotic calcifications are noted. Skull: Normal. Negative for fracture or focal  lesion. Sinuses/Orbits: No acute finding. Other: RIGHT posterior scalp soft tissue swelling is noted. CT CERVICAL SPINE FINDINGS Alignment: Normal. Skull base and vertebrae: No acute fracture. 30% compression of C7 is unchanged from 2013. No primary bone lesion or focal pathologic process. Soft tissues and spinal canal: No prevertebral fluid or swelling. No visible canal hematoma. Disc levels: Mild multilevel degenerative disc  disease again identified. Upper chest: No acute abnormality. Other: None IMPRESSION: 1. No evidence of acute intracranial abnormality. Atrophy and chronic small-vessel white matter ischemic changes. 2. RIGHT posterior scalp soft tissue swelling without fracture. 3. No static evidence of acute injury to the cervical spine. Electronically Signed   By: Harmon Pier M.D.   On: 07/01/2022 16:19   CT CERVICAL SPINE WO CONTRAST  Result Date: 07/01/2022 CLINICAL DATA:  81 year old female with headache and neck pain following fall. EXAM: CT HEAD WITHOUT CONTRAST CT CERVICAL SPINE WITHOUT CONTRAST TECHNIQUE: Multidetector CT imaging of the head and cervical spine was performed following the standard protocol without intravenous contrast. Multiplanar CT image reconstructions of the cervical spine were also generated. RADIATION DOSE REDUCTION: This exam was performed according to the departmental dose-optimization program which includes automated exposure control, adjustment of the mA and/or kV according to patient size and/or use of iterative reconstruction technique. COMPARISON:  05/07/2022 CTA head, 07/24/2019 brain MR, 03/24/2012 CT cervical spine and prior studies FINDINGS: CT HEAD FINDINGS Brain: No evidence of acute infarction, hemorrhage, hydrocephalus, extra-axial collection or mass lesion/mass effect. Atrophy and chronic small-vessel white matter ischemic changes again noted. Vascular: Carotid and vertebral atherosclerotic calcifications are noted. Skull: Normal. Negative for fracture or  focal lesion. Sinuses/Orbits: No acute finding. Other: RIGHT posterior scalp soft tissue swelling is noted. CT CERVICAL SPINE FINDINGS Alignment: Normal. Skull base and vertebrae: No acute fracture. 30% compression of C7 is unchanged from 2013. No primary bone lesion or focal pathologic process. Soft tissues and spinal canal: No prevertebral fluid or swelling. No visible canal hematoma. 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 Upper chest: No acute abnormality. Other: None IMPRESSION: 1. No evidence of acute intracranial abnormality. Atrophy and chronic small-vessel white matter ischemic changes. 2. RIGHT posterior scalp soft tissue swelling without fracture. 3. No static evidence of acute injury to the cervical spine. Electronically Signed   By: Harmon Pier M.D.   On: 07/01/2022 16:19   DG Knee 2 Views Right  Result Date: 07/01/2022 CLINICAL DATA:  Trauma, fall EXAM: RIGHT KNEE - 1-2 VIEW COMPARISON:  None Available. FINDINGS: No displaced fracture is seen. There is no significant effusion in suprapatellar bursa. In the AP view, there is faint vertically oriented lucency in the lateral aspect of distal femur which could not be distinctly seen in the lateral view. IMPRESSION: No displaced fracture is seen in right knee. There is no significant effusion. There are faint linear lucencies in the lateral aspect of distal femur seen only in the AP projection. This may be an artifact or suggest undisplaced fracture. Please correlate with clinical physical examination findings and consider four view examination of right knee if clinically warranted. Electronically Signed   By: Ernie Avena M.D.   On: 07/01/2022 15:45   DG Hip Unilat With Pelvis 2-3 Views Right  Result Date: 07/01/2022 CLINICAL DATA:  Trauma, fall EXAM: DG HIP (WITH OR WITHOUT PELVIS) 2-3V RIGHT COMPARISON:  None Available. FINDINGS: Comminuted intertrochanteric fracture is seen in proximal right femur. There  is overriding of fracture fragments. There is medial displacement of lesser trochanter. There is no dislocation. IMPRESSION: Comminuted, displaced intertrochanteric fracture is seen in proximal right femur. Electronically Signed   By: Ernie Avena M.D.   On: 07/01/2022 15:41   DG FEMUR, MIN 2 VIEWS RIGHT  Result Date: 07/01/2022 CLINICAL DATA:  Trauma, fall EXAM: RIGHT FEMUR 2 VIEWS COMPARISON:  None Available. FINDINGS: Comminuted intertrochanteric fracture is seen proximal right femur. There  is superior displacement of distal major fracture fragment. There is no dislocation. IMPRESSION: Comminuted, displaced intertrochanteric fracture is seen in proximal right femur. Electronically Signed   By: Ernie Avena M.D.   On: 07/01/2022 15:40   DG Pelvis 1-2 Views  Result Date: 07/01/2022 CLINICAL DATA:  Pain after fall EXAM: PELVIS - 1-2 VIEW COMPARISON:  None Available. FINDINGS: There is a comminuted angulated/displaced right intertrochanteric femoral fracture. IMPRESSION: Comminuted angulated/displaced right intertrochanteric hip fracture. Electronically Signed   By: Gerome Sam III M.D.   On: 07/01/2022 14:10    Pending Labs Unresulted Labs (From admission, onward)     Start     Ordered   07/02/22 0500  CBC  Tomorrow morning,   R        07/01/22 1705   07/01/22 1437  Urinalysis, Routine w reflex microscopic  Once,   URGENT        07/01/22 1437            Vitals/Pain Today's Vitals   07/01/22 1615 07/01/22 1630 07/01/22 1711 07/01/22 1728  BP: (!) 140/65 (!) 143/71  (!) 141/56  Pulse: 74 77  78  Resp: (!) 23 20  16   Temp:   98.2 F (36.8 C)   TempSrc:   Oral   SpO2:  93%  96%  PainSc:        Isolation Precautions No active isolations  Medications Medications  acetaminophen (TYLENOL) tablet 1,000 mg (has no administration in time range)  acetaminophen (TYLENOL) tablet 1,000 mg (has no administration in time range)  lisinopril (ZESTRIL) tablet 10 mg (has no  administration in time range)  citalopram (CELEXA) tablet 20 mg (has no administration in time range)  pantoprazole (PROTONIX) EC tablet 20 mg (has no administration in time range)  ferrous sulfate tablet 325 mg (has no administration in time range)  loratadine (CLARITIN) tablet 10 mg (has no administration in time range)  HYDROcodone-acetaminophen (NORCO/VICODIN) 5-325 MG per tablet 1-2 tablet (has no administration in time range)  enoxaparin (LOVENOX) injection 40 mg (has no administration in time range)  HYDROmorphone (DILAUDID) injection 0.5 mg (has no administration in time range)  senna-docusate (Senokot-S) tablet 1 tablet (has no administration in time range)  bisacodyl (DULCOLAX) EC tablet 5 mg (has no administration in time range)  morphine (PF) 4 MG/ML injection 4 mg (4 mg Intravenous Given 07/01/22 1447)  ondansetron (ZOFRAN) injection 4 mg (4 mg Intravenous Given 07/01/22 1447)  morphine (PF) 4 MG/ML injection 4 mg (4 mg Intravenous Given 07/01/22 1709)    Mobility walks Moderate fall risk   Focused Assessments     R Recommendations: See Admitting Provider Note  Report given to:   Additional Notes:   Femur fracture, Currently at PACU for nerver block.

## 2022-07-01 NOTE — ED Provider Notes (Signed)
[facility] EMERGENCY DEPARTMENT Provider Note   CSN: [id number] Arrival date & time: 07/01/22  1240     History {Add pertinent medical, surgical, social history, OB history to HPI:1} Chief Complaint  Patient presents with   Fall    Hip pain    Docia Klar is a 81 y.o. female.  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  Patient states that she was standing up from throwing something out in her dining room when she lost balance and fell onto her right side.  Says she landed on her right hip and was on the ground from last night until this morning.  Says that she is also having pain in her right knee.  Denies any numbness or weakness of her right foot.  Says that she is not on blood thinners but did strike her head.  No loss of consciousness.  Was placed in a c-collar by EMS and was given 150 mcg of fentanyl prior to arrival with improvement of her pain. Not on AC.    Fall       Home Medications Prior to Admission medications   Medication Sig Start Date End Date Taking? Authorizing Provider  alendronate (FOSAMAX) 70 MG tablet TK 1 T PO  PO Q WEEK 01/14/18   [provider]  Ascorbic Acid (VITAMIN C) 100 MG tablet Take by mouth.    [provider]  cetirizine (ZYRTEC) 5 MG tablet Take 5 mg by mouth daily.    [provider]  citalopram (CELEXA) 40 MG tablet Take 20 mg by mouth daily.     [provider]  lisinopril (ZESTRIL) 10 MG tablet Take 10 mg by mouth daily.    [provider]  pantoprazole (PROTONIX) 20 MG tablet Take 20 mg by mouth daily.    [provider]      Allergies    Patient has no known allergies.    Review of Systems   Review of Systems  Physical Exam Updated Vital Signs BP (!) 145/67   Pulse 70   Temp 98.4 F (36.9 C) (Oral)   Resp (!) 23   SpO2 97%  Physical Exam Vitals and nursing note  reviewed.  Constitutional:      General: She is not in acute distress.    Appearance: She is well-developed.  HENT:     Head: Normocephalic.     Right Ear: External ear normal.     Left Ear: External ear normal.     Mouth/Throat:     Mouth: Mucous membranes are moist.     Pharynx: Oropharynx is clear.  Eyes:     Extraocular Movements: Extraocular movements intact.     Conjunctiva/sclera: Conjunctivae normal.     Pupils: Pupils are equal, round, and reactive to light.  Neck:     Comments: C-collar in place Cardiovascular:     Rate and Rhythm: Normal rate and regular rhythm.     Heart sounds: No murmur heard. Pulmonary:     Effort: Pulmonary effort is normal. No respiratory distress.     Breath sounds: Normal breath sounds.  Abdominal:     General: There is no distension.     Palpations: Abdomen is soft. There is no mass.     Tenderness: There is no abdominal tenderness. There is no guarding.  Musculoskeletal:        General: No swelling.     Cervical back:  Neck supple.     Comments: Tenderness to palpation of right hip and right patella.  No tenderness to palpation of right ankle.  No tenderness to palpation of bilateral upper extremities including snuffbox and left lower extremity.  Motor: Muscle bulk and tone are normal. Strength is 5/5 in ankle dorsiflexion and plantar flexion bilaterally. Full strength of great toe dorsiflexion bilaterally.  Sensory: Intact sensation to light touch in L5 though S1 dermatomes bilaterally.  Pulses: BL feet appear warm and well perfused. DP pulses 2+ BL and capillary refill 2-3 seconds in all toes.   Skin:    General: Skin is warm and dry.     Capillary Refill: Capillary refill takes less than 2 seconds.  Neurological:     Mental Status: She is alert and oriented to person, place, and time. Mental status is at baseline.     Comments: Full strength in upper and lower extremities.  Cranial nerves grossly intact.  Psychiatric:        Mood and  Affect: Mood normal.     ED Results / Procedures / Treatments   Labs (all labs ordered are listed, but only abnormal results are displayed) Labs Reviewed  BASIC METABOLIC PANEL  CBC WITH DIFFERENTIAL/PLATELET  PROTIME-INR  APTT  URINALYSIS, ROUTINE W REFLEX MICROSCOPIC  CK  TYPE AND SCREEN    EKG None  Radiology DG Pelvis 1-2 Views  Result Date: 07/01/2022 CLINICAL DATA:  Pain after fall EXAM: PELVIS - 1-2 VIEW COMPARISON:  None Available. FINDINGS: There is a comminuted angulated/displaced right intertrochanteric femoral fracture. IMPRESSION: Comminuted angulated/displaced right intertrochanteric hip fracture. Electronically Signed   By: Gerome Sam III M.D.   On: 07/01/2022 14:10    Procedures Procedures  {Document cardiac monitor, telemetry assessment procedure when appropriate:1}  Medications Ordered in ED Medications - No data to display  ED Course/ Medical Decision Making/ A&P Clinical Course as of 07/01/22 1438  Sat Jul 01, 2022  1434 Dr Dion Saucier is pt's ortho doctor. They request to be seen by [facility].  [RP]    Clinical Course User Index [RP] Rondel Baton, MD                           Medical Decision Making Amount and/or Complexity of Data Reviewed Labs: ordered. Radiology: ordered.  Risk Prescription drug management. Decision regarding hospitalization.   ***  {Document critical care time when appropriate:1} {Document review of labs and clinical decision tools ie heart score, Chads2Vasc2 etc:1}  {Document your independent review of radiology images, and any outside records:1} {Document your discussion with family members, caretakers, and with consultants:1} {Document social determinants of health affecting pt's care:1} {Document your decision making why or why not admission, treatments were needed:1} Final Clinical Impression(s) / ED Diagnoses Final diagnoses:  Closed fracture of right hip, initial encounter [facility])    Rx / DC  Orders ED Discharge Orders     None

## 2022-07-01 NOTE — Progress Notes (Signed)
Orthopedic Tech Progress Note Patient Details:  Amber Ruiz 1940-12-29 [id number]  Musculoskeletal Traction Type of Traction: Bucks Skin Traction Traction Location: rle Traction Weight: 10 lbs   Post Interventions Patient Tolerated: Well Instructions Provided: Care of device, Adjustment of device  Trinna Post 07/01/2022, 8:20 PM

## 2022-07-01 NOTE — ED Notes (Signed)
[name] (son) can be reached at [phone].

## 2022-07-01 NOTE — Anesthesia Preprocedure Evaluation (Signed)
Anesthesia Evaluation  Patient identified by MRN, date of birth, ID band Patient awake    Reviewed: Allergy & Precautions, NPO status , Patient's Chart, lab work & pertinent test results  Airway Mallampati: II  TM Distance: >3 FB Neck ROM: Full    Dental  (+) Teeth Intact, Dental Advisory Given   Pulmonary neg pulmonary ROS,    Pulmonary exam normal breath sounds clear to auscultation       Cardiovascular hypertension, Pt. on medications Normal cardiovascular exam Rhythm:Regular Rate:Normal     Neuro/Psych  Headaches, PSYCHIATRIC DISORDERS Anxiety    GI/Hepatic Neg liver ROS, hiatal hernia, GERD  Medicated,  Endo/Other  negative endocrine ROS  Renal/GU negative Renal ROS     Musculoskeletal negative musculoskeletal ROS (+)   Abdominal   Peds  Hematology  (+) Blood dyscrasia, anemia ,   Anesthesia Other Findings Day of surgery medications reviewed with the patient.  Reproductive/Obstetrics                             Anesthesia Physical Anesthesia Plan  ASA: 3  Anesthesia Plan: Regional   Post-op Pain Management: Regional block*   Induction: Intravenous  PONV Risk Score and Plan: 2 and Treatment may vary due to age or medical condition  Airway Management Planned: Natural Airway and Nasal Cannula  Additional Equipment:   Intra-op Plan:   Post-operative Plan:   Informed Consent: I have reviewed the patients History and Physical, chart, labs and discussed the procedure including the risks, benefits and alternatives for the proposed anesthesia with the patient or authorized representative who has indicated his/her understanding and acceptance.       Plan Discussed with:   Anesthesia Plan Comments:         Anesthesia Quick Evaluation

## 2022-07-02 ENCOUNTER — Encounter (HOSPITAL_COMMUNITY)
Admission: EM | Disposition: A | Discharge status: Rehabilitation Facility | Payer: Self-pay | Source: Home / Self Care | Attending: Internal Medicine

## 2022-07-02 ENCOUNTER — Inpatient Hospital Stay (HOSPITAL_COMMUNITY): Payer: Medicare Other

## 2022-07-02 ENCOUNTER — Other Ambulatory Visit: Payer: Self-pay

## 2022-07-02 ENCOUNTER — Inpatient Hospital Stay (HOSPITAL_COMMUNITY): Payer: Medicare Other | Admitting: Anesthesiology

## 2022-07-02 ENCOUNTER — Encounter (HOSPITAL_COMMUNITY): Payer: Self-pay | Admitting: Internal Medicine

## 2022-07-02 DIAGNOSIS — D649 Anemia, unspecified: Secondary | ICD-10-CM | POA: Diagnosis not present

## 2022-07-02 DIAGNOSIS — R55 Syncope and collapse: Secondary | ICD-10-CM

## 2022-07-02 DIAGNOSIS — S72141A Displaced intertrochanteric fracture of right femur, initial encounter for closed fracture: Secondary | ICD-10-CM

## 2022-07-02 DIAGNOSIS — F419 Anxiety disorder, unspecified: Secondary | ICD-10-CM

## 2022-07-02 DIAGNOSIS — K449 Diaphragmatic hernia without obstruction or gangrene: Secondary | ICD-10-CM

## 2022-07-02 DIAGNOSIS — S72001D Fracture of unspecified part of neck of right femur, subsequent encounter for closed fracture with routine healing: Secondary | ICD-10-CM | POA: Diagnosis not present

## 2022-07-02 HISTORY — PX: INTRAMEDULLARY (IM) NAIL INTERTROCHANTERIC: SHX5875

## 2022-07-02 LAB — CBC
HCT: 32.5 % — ABNORMAL LOW (ref 36.0–46.0)
Hemoglobin: 10 g/dL — ABNORMAL LOW (ref 12.0–15.0)
MCH: 26.6 pg (ref 26.0–34.0)
MCHC: 30.8 g/dL (ref 30.0–36.0)
MCV: 86.4 fL (ref 80.0–100.0)
Platelets: 226 10*3/uL (ref 150–400)
RBC: 3.76 MIL/uL — ABNORMAL LOW (ref 3.87–5.11)
RDW: 13.9 % (ref 11.5–15.5)
WBC: 8.8 10*3/uL (ref 4.0–10.5)
nRBC: 0 % (ref 0.0–0.2)

## 2022-07-02 LAB — ABO/RH: ABO/RH(D): A POS

## 2022-07-02 LAB — ECHOCARDIOGRAM COMPLETE
Area-P 1/2: 4.06 cm2
Height: 60 in
S' Lateral: 2.2 cm
Weight: 2160 oz

## 2022-07-02 LAB — CREATININE, SERUM
Creatinine, Ser: 0.9 mg/dL (ref 0.44–1.00)
GFR, Estimated: >60 mL/min (ref >60)

## 2022-07-02 LAB — VITAMIN D 25 HYDROXY (VIT D DEFICIENCY, FRACTURES): Vit D, 25-Hydroxy: 19.74 ng/mL — ABNORMAL LOW (ref 30–100)

## 2022-07-02 LAB — SURGICAL PCR SCREEN
MRSA, PCR: NEGATIVE
Staphylococcus aureus: NEGATIVE

## 2022-07-02 SURGERY — FIXATION, FRACTURE, INTERTROCHANTERIC, WITH INTRAMEDULLARY ROD
Anesthesia: General | Laterality: Right

## 2022-07-02 MED ORDER — ONDANSETRON HCL 4 MG/2ML IJ SOLN
INTRAMUSCULAR | Status: AC
Start: 2022-07-02 — End: ?
  Filled 2022-07-02: qty 2

## 2022-07-02 MED ORDER — EPHEDRINE SULFATE (PRESSORS) 50 MG/ML IJ SOLN
INTRAMUSCULAR | Status: DC | PRN
  Administered 2022-07-02: 5 mg via INTRAVENOUS
  Administered 2022-07-02: 10 mg via INTRAVENOUS

## 2022-07-02 MED ORDER — DOCUSATE SODIUM 100 MG PO CAPS
100.0000 mg | ORAL_CAPSULE | Freq: Two times a day (BID) | ORAL | Status: DC
  Administered 2022-07-02 – 2022-07-03 (×3): 100 mg via ORAL
  Filled 2022-07-02 (×3): qty 1

## 2022-07-02 MED ORDER — SODIUM CHLORIDE 0.9 % IV SOLN: INTRAVENOUS | Status: DC

## 2022-07-02 MED ORDER — CELECOXIB 200 MG PO CAPS: 200.0000 mg | ORAL_CAPSULE | Freq: Once | ORAL | Status: DC

## 2022-07-02 MED ORDER — FENTANYL CITRATE (PF) 250 MCG/5ML IJ SOLN
INTRAMUSCULAR | Status: AC
  Filled 2022-07-02: qty 5

## 2022-07-02 MED ORDER — HYDROCODONE-ACETAMINOPHEN 5-325 MG PO TABS
1.0000 | ORAL_TABLET | ORAL | Status: DC | PRN
  Administered 2022-07-02 – 2022-07-03 (×2): 2 via ORAL
  Filled 2022-07-02 (×2): qty 2

## 2022-07-02 MED ORDER — DEXAMETHASONE SODIUM PHOSPHATE 10 MG/ML IJ SOLN
INTRAMUSCULAR | Status: AC
Start: 2022-07-02 — End: ?
  Filled 2022-07-02: qty 1

## 2022-07-02 MED ORDER — LACTATED RINGERS IV SOLN: INTRAVENOUS | Status: DC

## 2022-07-02 MED ORDER — AMISULPRIDE (ANTIEMETIC) 5 MG/2ML IV SOLN: 10.0000 mg | Freq: Once | INTRAVENOUS | Status: DC | PRN

## 2022-07-02 MED ORDER — PHENYLEPHRINE HCL-NACL 20-0.9 MG/250ML-% IV SOLN
INTRAVENOUS | Status: DC | PRN
  Administered 2022-07-02: 25 ug/min via INTRAVENOUS

## 2022-07-02 MED ORDER — PHENYLEPHRINE 80 MCG/ML (10ML) SYRINGE FOR IV PUSH (FOR BLOOD PRESSURE SUPPORT)
PREFILLED_SYRINGE | INTRAVENOUS | Status: DC | PRN
  Administered 2022-07-02: 120 ug via INTRAVENOUS
  Administered 2022-07-02: 40 ug via INTRAVENOUS

## 2022-07-02 MED ORDER — LIDOCAINE 2% (20 MG/ML) 5 ML SYRINGE
INTRAMUSCULAR | Status: DC | PRN
  Administered 2022-07-02: 100 mg via INTRAVENOUS

## 2022-07-02 MED ORDER — METOCLOPRAMIDE HCL 5 MG PO TABS: 5.0000 mg | ORAL_TABLET | Freq: Three times a day (TID) | ORAL | Status: DC | PRN

## 2022-07-02 MED ORDER — CHLORHEXIDINE GLUCONATE 0.12 % MT SOLN
OROMUCOSAL | Status: AC
  Administered 2022-07-02: 15 mL via OROMUCOSAL
  Filled 2022-07-02: qty 15

## 2022-07-02 MED ORDER — LIDOCAINE 2% (20 MG/ML) 5 ML SYRINGE
INTRAMUSCULAR | Status: AC
  Filled 2022-07-02: qty 5

## 2022-07-02 MED ORDER — 0.9 % SODIUM CHLORIDE (POUR BTL) OPTIME
TOPICAL | Status: DC | PRN
  Administered 2022-07-02: 1000 mL

## 2022-07-02 MED ORDER — ONDANSETRON HCL 4 MG/2ML IJ SOLN
INTRAMUSCULAR | Status: DC | PRN
  Administered 2022-07-02: 4 mg via INTRAVENOUS

## 2022-07-02 MED ORDER — PROPOFOL 10 MG/ML IV BOLUS
INTRAVENOUS | Status: DC | PRN
  Administered 2022-07-02: 100 mg via INTRAVENOUS

## 2022-07-02 MED ORDER — FENTANYL CITRATE (PF) 100 MCG/2ML IJ SOLN
INTRAMUSCULAR | Status: AC
  Filled 2022-07-02: qty 2

## 2022-07-02 MED ORDER — ORAL CARE MOUTH RINSE: 15.0000 mL | Freq: Once | OROMUCOSAL | Status: AC

## 2022-07-02 MED ORDER — PROPOFOL 10 MG/ML IV BOLUS
INTRAVENOUS | Status: AC
  Filled 2022-07-02: qty 20

## 2022-07-02 MED ORDER — SUGAMMADEX SODIUM 200 MG/2ML IV SOLN
INTRAVENOUS | Status: DC | PRN
  Administered 2022-07-02: 200 mg via INTRAVENOUS

## 2022-07-02 MED ORDER — TRANEXAMIC ACID-NACL 1000-0.7 MG/100ML-% IV SOLN
1000.0000 mg | Freq: Once | INTRAVENOUS | Status: AC
  Administered 2022-07-02: 1000 mg via INTRAVENOUS
  Filled 2022-07-02: qty 100

## 2022-07-02 MED ORDER — FENTANYL CITRATE (PF) 250 MCG/5ML IJ SOLN
INTRAMUSCULAR | Status: DC | PRN
  Administered 2022-07-02 (×3): 50 ug via INTRAVENOUS

## 2022-07-02 MED ORDER — EPHEDRINE 5 MG/ML INJ
INTRAVENOUS | Status: AC
  Filled 2022-07-02: qty 5

## 2022-07-02 MED ORDER — ENOXAPARIN SODIUM 40 MG/0.4ML IJ SOSY
40.0000 mg | PREFILLED_SYRINGE | INTRAMUSCULAR | Status: DC
  Administered 2022-07-03: 40 mg via SUBCUTANEOUS
  Filled 2022-07-02: qty 0.4

## 2022-07-02 MED ORDER — METOCLOPRAMIDE HCL 5 MG/ML IJ SOLN: 5.0000 mg | Freq: Three times a day (TID) | INTRAMUSCULAR | Status: DC | PRN

## 2022-07-02 MED ORDER — ONDANSETRON HCL 4 MG/2ML IJ SOLN: 4.0000 mg | Freq: Four times a day (QID) | INTRAMUSCULAR | Status: DC | PRN

## 2022-07-02 MED ORDER — POLYETHYLENE GLYCOL 3350 17 G PO PACK: 17.0000 g | PACK | Freq: Every day | ORAL | Status: DC | PRN

## 2022-07-02 MED ORDER — ROCURONIUM BROMIDE 10 MG/ML (PF) SYRINGE
PREFILLED_SYRINGE | INTRAVENOUS | Status: AC
Start: 2022-07-02 — End: ?
  Filled 2022-07-02: qty 10

## 2022-07-02 MED ORDER — VANCOMYCIN HCL 1000 MG IV SOLR
INTRAVENOUS | Status: AC
Start: 2022-07-02 — End: ?
  Filled 2022-07-02: qty 20

## 2022-07-02 MED ORDER — CEFAZOLIN SODIUM-DEXTROSE 2-4 GM/100ML-% IV SOLN
2.0000 g | Freq: Three times a day (TID) | INTRAVENOUS | Status: AC
  Administered 2022-07-02 – 2022-07-03 (×3): 2 g via INTRAVENOUS
  Filled 2022-07-02 (×3): qty 100

## 2022-07-02 MED ORDER — CHLORHEXIDINE GLUCONATE 0.12 % MT SOLN: 15.0000 mL | Freq: Once | OROMUCOSAL | Status: AC

## 2022-07-02 MED ORDER — CEFAZOLIN SODIUM-DEXTROSE 2-4 GM/100ML-% IV SOLN
INTRAVENOUS | Status: AC
  Filled 2022-07-02: qty 100

## 2022-07-02 MED ORDER — ACETAMINOPHEN 500 MG PO TABS
1000.0000 mg | ORAL_TABLET | Freq: Once | ORAL | Status: AC
  Administered 2022-07-02: 1000 mg via ORAL
  Filled 2022-07-02: qty 2

## 2022-07-02 MED ORDER — DEXAMETHASONE SODIUM PHOSPHATE 10 MG/ML IJ SOLN
INTRAMUSCULAR | Status: DC | PRN
  Administered 2022-07-02: 8 mg via INTRAVENOUS

## 2022-07-02 MED ORDER — ONDANSETRON HCL 4 MG PO TABS: 4.0000 mg | ORAL_TABLET | Freq: Four times a day (QID) | ORAL | Status: DC | PRN

## 2022-07-02 MED ORDER — PROMETHAZINE HCL 25 MG/ML IJ SOLN: 6.2500 mg | INTRAMUSCULAR | Status: DC | PRN

## 2022-07-02 MED ORDER — FENTANYL CITRATE (PF) 100 MCG/2ML IJ SOLN
25.0000 ug | INTRAMUSCULAR | Status: DC | PRN
  Administered 2022-07-02 (×2): 50 ug via INTRAVENOUS

## 2022-07-02 MED ORDER — ROCURONIUM BROMIDE 10 MG/ML (PF) SYRINGE
PREFILLED_SYRINGE | INTRAVENOUS | Status: DC | PRN
  Administered 2022-07-02: 60 mg via INTRAVENOUS

## 2022-07-02 MED ORDER — PHENYLEPHRINE 80 MCG/ML (10ML) SYRINGE FOR IV PUSH (FOR BLOOD PRESSURE SUPPORT)
PREFILLED_SYRINGE | INTRAVENOUS | Status: AC
  Filled 2022-07-02: qty 10

## 2022-07-02 MED ORDER — CEFAZOLIN SODIUM-DEXTROSE 2-3 GM-%(50ML) IV SOLR
INTRAVENOUS | Status: DC | PRN
  Administered 2022-07-02: 2 g via INTRAVENOUS

## 2022-07-02 SURGICAL SUPPLY — 49 items
ADH SKN CLS APL DERMABOND .7 (GAUZE/BANDAGES/DRESSINGS) ×1
APL PRP STRL LF DISP 70% ISPRP (MISCELLANEOUS) ×1
BAG COUNTER SPONGE SURGICOUNT (BAG) ×1 IMPLANT
BAG SPNG CNTER NS LX DISP (BAG) ×1
BIT DRILL INTERTAN LAG SCREW (BIT) ×1 IMPLANT
BIT DRILL LONG 4.0 (BIT) IMPLANT
BRUSH SCRUB EZ PLAIN DRY (MISCELLANEOUS) ×4 IMPLANT
CHLORAPREP W/TINT 26 (MISCELLANEOUS) ×2 IMPLANT
COVER PERINEAL POST (MISCELLANEOUS) ×2 IMPLANT
COVER SURGICAL LIGHT HANDLE (MISCELLANEOUS) ×2 IMPLANT
DERMABOND ADVANCED (GAUZE/BANDAGES/DRESSINGS) ×2
DERMABOND ADVANCED .7 DNX12 (GAUZE/BANDAGES/DRESSINGS) ×1 IMPLANT
DRAPE C-ARM 35X43 STRL (DRAPES) ×2 IMPLANT
DRAPE IMP U-DRAPE 54X76 (DRAPES) ×4 IMPLANT
DRAPE INCISE IOBAN 66X45 STRL (DRAPES) ×2 IMPLANT
DRAPE STERI IOBAN 125X83 (DRAPES) ×2 IMPLANT
DRAPE SURG 17X23 STRL (DRAPES) ×3 IMPLANT
DRAPE U-SHAPE 47X51 STRL (DRAPES) ×2 IMPLANT
DRESSING MEPILEX FLEX 4X4 (GAUZE/BANDAGES/DRESSINGS) IMPLANT
DRILL BIT LONG 4.0 (BIT) ×2
DRSG MEPILEX BORDER 4X4 (GAUZE/BANDAGES/DRESSINGS) ×1 IMPLANT
DRSG MEPILEX BORDER 4X8 (GAUZE/BANDAGES/DRESSINGS) ×2 IMPLANT
DRSG MEPILEX FLEX 4X4 (GAUZE/BANDAGES/DRESSINGS) ×2
ELECT REM PT RETURN 9FT ADLT (ELECTROSURGICAL) ×2
ELECTRODE REM PT RTRN 9FT ADLT (ELECTROSURGICAL) ×1 IMPLANT
GLOVE BIO SURGEON STRL SZ 6.5 (GLOVE) ×3 IMPLANT
GLOVE BIO SURGEON STRL SZ7.5 (GLOVE) ×8 IMPLANT
GLOVE BIOGEL PI IND STRL 6.5 (GLOVE) ×1 IMPLANT
GLOVE BIOGEL PI IND STRL 7.5 (GLOVE) ×1 IMPLANT
GLOVE BIOGEL PI INDICATOR 6.5 (GLOVE)
GLOVE BIOGEL PI INDICATOR 7.5 (GLOVE) ×2
GUIDE PIN 3.2X343 (PIN) ×2
GUIDE PIN 3.2X343MM (PIN) ×4
KIT BASIN OR (CUSTOM PROCEDURE TRAY) ×2 IMPLANT
KIT TURNOVER KIT B (KITS) ×2 IMPLANT
NAIL INTERTAN 10X18 130D 10S (Nail) ×1 IMPLANT
NS IRRIG 1000ML POUR BTL (IV SOLUTION) ×2 IMPLANT
PACK GENERAL/GYN (CUSTOM PROCEDURE TRAY) ×2 IMPLANT
PAD ARMBOARD 7.5X6 YLW CONV (MISCELLANEOUS) ×4 IMPLANT
PIN GUIDE 3.2X343MM (PIN) IMPLANT
SCREW LAG COMPR KIT 95/90 (Screw) ×1 IMPLANT
SCREW TRIGEN LOW PROF 5.0X35 (Screw) ×1 IMPLANT
SUT MNCRL AB 3-0 PS2 18 (SUTURE) ×2 IMPLANT
SUT VIC AB 0 CT1 27 (SUTURE)
SUT VIC AB 0 CT1 27XBRD ANBCTR (SUTURE) IMPLANT
SUT VIC AB 2-0 CT1 27 (SUTURE) ×4
SUT VIC AB 2-0 CT1 TAPERPNT 27 (SUTURE) ×2 IMPLANT
TOWEL GREEN STERILE (TOWEL DISPOSABLE) ×4 IMPLANT
WATER STERILE IRR 1000ML POUR (IV SOLUTION) ×2 IMPLANT

## 2022-07-02 NOTE — Progress Notes (Signed)
  Echocardiogram 2D Echocardiogram has been performed.  Delcie Roch 07/02/2022, 2:52 PM

## 2022-07-02 NOTE — Anesthesia Procedure Notes (Signed)
Procedure Name: Intubation Date/Time: 07/02/2022 7:50 AM  Performed by: Inda Coke, CRNAPre-anesthesia Checklist: Patient identified, Emergency Drugs available, Suction available, Timeout performed and Patient being monitored Patient Re-evaluated:Patient Re-evaluated prior to induction Oxygen Delivery Method: Circle system utilized Preoxygenation: Pre-oxygenation with 100% oxygen Induction Type: IV induction Ventilation: Mask ventilation without difficulty Laryngoscope Size: Mac and 3 Grade View: Grade I Tube type: Oral Tube size: 7.5 mm Airway Equipment and Method: Stylet Placement Confirmation: ETT inserted through vocal cords under direct vision, positive ETCO2, CO2 detector and breath sounds checked- equal and bilateral Secured at: 22 cm Tube secured with: Tape Dental Injury: Teeth and Oropharynx as per pre-operative assessment

## 2022-07-02 NOTE — H&P (View-Only) (Signed)
Orthopaedic Trauma Service (OTS) Consult   Patient ID: Amber Ruiz MRN: [id number] DOB/AGE: 1941/01/21 81 y.o.  Reason for Consult:Right intertrochanteric femur fracture Referring Physician: Dr. Vonita Moss, MD [facility]  HPI: Amber Ruiz is an 81 y.o. female who is being seen at the request of Dr. Jarold Motto for evaluation of right intertrochanteric femur fracture.  The patient lives alone in fell overnight landing on her hip had immediate pain and could not call anyone.  Her son-in-law went to check on her in the morning and found her and called EMS.  She was brought in yesterday.  She currently comfortable.  She had a nerve block last night.  She is otherwise independently living and ambulatory without assist device.  No major medical problems.  Patient was seen and evaluated in the preoperative holding area.  Denies any significant numbness or tingling.  Denies any other injuries.  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 No pertinent family history.  Social History:  reports that she has never smoked. She has never used smokeless tobacco. She reports that she does not drink alcohol. No history on file for drug use.  Allergies: No Known Allergies  Medications:  No current facility-administered medications on file prior to encounter.   Current Outpatient Medications on File Prior to Encounter  Medication Sig Dispense Refill   acetaminophen (TYLENOL) 500 MG tablet Take 1,000 mg by mouth every 6 (six) hours as needed for mild pain.     alendronate (FOSAMAX) 70 MG tablet Take 70 mg by mouth once a week.     cetirizine (ZYRTEC) 5 MG tablet Take 5 mg by mouth daily.     citalopram (CELEXA) 40 MG tablet Take 20 mg by mouth daily.       FEROSUL 325 (65 Fe) MG tablet Take 325 mg by mouth daily.     lisinopril (ZESTRIL) 10 MG tablet Take 10 mg by mouth daily.     pantoprazole (PROTONIX) 20 MG tablet Take 20 mg by mouth daily.       ROS: Constitutional: No fever or chills Vision: No changes in vision ENT: No difficulty swallowing CV: No chest pain Pulm: No SOB or wheezing GI: No nausea or vomiting GU: No urgency or inability to hold urine Skin: No poor wound healing Neurologic: No numbness or tingling Psychiatric: No depression or anxiety Heme: No bruising Allergic: No reaction to medications or food   Exam: Blood pressure 118/64, pulse 65, temperature 98.3 F (36.8 C), temperature source Oral, resp. rate 16, height 5' (1.524 m), weight 61.2 kg, SpO2 92 %. General: No acute distress Orientation: Awake alert and oriented x3 Mood and Affect: Cooperative and pleasant Gait: Unable to assess due to her fracture Coordination and balance: Within normal limits  Right lower extremity: Buck's traction is in place.  There is some shortening of the lower extremity.  Swelling through the right hip.  No skin lesions no lacerations.  She is actively able to dorsiflex and plantarflex the foot and toes.  Warm well-perfused foot with sensation is intact to light touch throughout the lower extremity.  Left lower extremity: Skin without lesions. No tenderness to palpation. Full painless ROM, full strength in each muscle  groups without evidence of instability.   Medical Decision Making: Data: Imaging: X-rays of the right hip and CT scan of the knee are reviewed which shows an intertrochanteric femur fracture.  There is no significant bony injury to the right knee.  Labs:  Results for orders placed or performed during the hospital encounter of 07/01/22 (from the past 24 hour(s))  Type and screen [facility]     Status: None   Collection Time: 07/01/22  3:38 PM  Result Value Ref Range   ABO/RH(D) A POS     Antibody Screen NEG    Sample Expiration      07/04/2022,2359 Performed at [facility], [address], [place], [place] [address]   Basic metabolic panel     Status: Abnormal   Collection Time: 07/01/22  3:39 PM  Result Value Ref Range   Sodium 139 135 - 145 mmol/L   Potassium 4.3 3.5 - 5.1 mmol/L   Chloride 108 98 - 111 mmol/L   CO2 25 22 - 32 mmol/L   Glucose, Bld 137 (H) 70 - 99 mg/dL   BUN 9 8 - 23 mg/dL   Creatinine, Ser 8.67 0.44 - 1.00 mg/dL   Calcium 7.9 (L) 8.9 - 10.3 mg/dL   GFR, Estimated >61 >95 mL/min   Anion gap 6 5 - 15  CBC with Differential     Status: Abnormal   Collection Time: 07/01/22  3:39 PM  Result Value Ref Range   WBC 10.9 (H) 4.0 - 10.5 K/uL   RBC 3.97 3.87 - 5.11 MIL/uL   Hemoglobin 10.7 (L) 12.0 - 15.0 g/dL   HCT 09.3 (L) 26.7 - 12.4 %   MCV 86.6 80.0 - 100.0 fL   MCH 27.0 26.0 - 34.0 pg   MCHC 31.1 30.0 - 36.0 g/dL   RDW 58.0 99.8 - 33.8 %   Platelets 209 150 - 400 K/uL   nRBC 0.0 0.0 - 0.2 %   Neutrophils Relative % 83 %   Neutro Abs 9.0 (H) 1.7 - 7.7 K/uL   Lymphocytes Relative 9 %   Lymphs Abs 1.0 0.7 - 4.0 K/uL   Monocytes Relative 8 %   Monocytes Absolute 0.9 0.1 - 1.0 K/uL   Eosinophils Relative 0 %   Eosinophils Absolute 0.0 0.0 - 0.5 K/uL   Basophils Relative 0 %   Basophils Absolute 0.0 0.0 - 0.1 K/uL   Immature Granulocytes 0 %   Abs Immature Granulocytes 0.04 0.00 - 0.07 K/uL  Protime-INR     Status: None   Collection Time: 07/01/22  3:39 PM  Result Value Ref Range   Prothrombin Time 14.2 11.4 - 15.2 seconds   INR 1.1 0.8 - 1.2  APTT     Status: Abnormal   Collection Time: 07/01/22  3:39 PM  Result Value Ref Range   aPTT 21 (L) 24 - 36 seconds  CK     Status: None   Collection Time: 07/01/22  3:39 PM  Result Value Ref Range   Total CK 205 38 - 234 U/L  ABO/Rh     Status: None   Collection Time: 07/02/22 12:42 AM  Result Value Ref Range   ABO/RH(D)      A POS Performed at [facility], [address], [place], [place] [address]   CBC     Status: Abnormal   Collection Time: 07/02/22 12:45 AM  Result Value Ref Range   WBC 8.8 4.0 - 10.5  K/uL   RBC 3.76 (L) 3.87 - 5.11 MIL/uL   Hemoglobin 10.0 (L) 12.0 - 15.0 g/dL   HCT 81.4 (L) 48.1 - 85.6 %   MCV 86.4 80.0 - 100.0 fL   MCH 26.6 26.0 - 34.0 pg   MCHC 30.8 30.0 - 36.0 g/dL   RDW 31.4 97.0 - 26.3 %   Platelets 226 150 - 400 K/uL   nRBC 0.0 0.0 - 0.2 %     Imaging or Labs ordered: None  Medical history and chart was reviewed and case discussed with medical provider.  Assessment/Plan: 81 year old female with right intertrochanteric femur fracture.  We will plan to proceed with cephalomedullary nailing of her right hip.  Risks and benefits were discussed with the patient.  Risks include but not limited to bleeding, infection, malunion, nonunion, hardware failure, hardware irritation, nerve or vessel injury, DVT, even the possibility of anesthetic complications.  She agrees to proceed with surgery and consent was obtained.  Roby Lofts, MD [facility] [phone] (office) [email or web address]

## 2022-07-02 NOTE — Evaluation (Signed)
Physical Therapy Evaluation Patient Details Name: Amber Ruiz MRN: [id number] DOB: [birth year] Today's Date: 07/02/2022  History of Present Illness  Pt is an 81 y.o. female who presented 07/01/22 s/p fall and sustained a comminuted, displaced intertrochanteric R hip fx. S/p cephalomedullary nailing of R intertrochanteric femur fx 8/13. PMH: auricles, HTN, OCD, anxiety/depression, anemia   Clinical Impression  Pt presents with condition above and deficits mentioned below, see PT Problem List. PTA, she was IND without DME, living alone in a 2-level house with 4-6 STE. Her bedroom and shower are upstairs, but she can sleep in the recliner and use the half-bath downstairs. 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 Pt became diaphoretic and pale after standing to transfer to the recliner, see General Comments below. Performed modified L Dix Hallpike (by using bed controls) with noted upward and R nystagmus lasting > 90 sec, indicating potential R posterior cupulolithiasis. Thus, performed x1 rep of Semont Maneuver, but unable to move quickly at times due to R hip pain. Pt is requiring maxA for supine > sit transitions and modA for transfers and to take a few steps using a RW at this time. Pt would greatly benefit from intensive therapy in the AIR setting considering her drastic decline in functional status. Will continue to follow acutely and plan to further assess her potential vestibular deficits and monitor her BP for orthostatic hypotension.      Recommendations for follow up therapy are one component of a multi-disciplinary discharge planning process, led by the attending physician.  Recommendations may be updated based on patient status, additional functional criteria and insurance authorization.  Follow Up Recommendations Acute inpatient rehab (3hours/day)       Assistance Recommended at Discharge Frequent or constant Supervision/Assistance  Patient can return home with the following  A lot of help with walking and/or transfers;A lot of help with bathing/dressing/bathroom;Assistance with cooking/housework;Assist for transportation;Help with stairs or ramp for entrance    Equipment Recommendations Rolling walker (2 wheels);BSC/3in1  Recommendations for Other Services  Rehab consult    Functional Status Assessment Patient has had a recent decline in their functional status and demonstrates the ability to make significant improvements in function in a reasonable and predictable amount of time.     Precautions / Restrictions Precautions Precautions: Fall;Other (comment) Precaution Comments: watch BP and SpO2; vertigo Restrictions Weight Bearing Restrictions: Yes RLE Weight Bearing: Weight bearing as tolerated      Mobility  Bed Mobility Overal bed mobility: Needs Assistance Bed Mobility: Supine to Sit     Supine to sit: Max assist, HOB elevated     General bed mobility comments: Cues to bring legs to L EOB, needing assistance to manage R leg. MaxA to ascend trunk and pivot hips using bed pad.    Transfers Overall transfer level: Needs assistance Equipment used: Rolling walker (2 wheels) Transfers: Sit to/from Stand, Bed to chair/wheelchair/BSC Sit to Stand: Mod assist   Step pivot transfers: Mod assist       General transfer comment: ModA to facilitate anterior weight shift as pt has posterior bias. ModA to cue weight shifting, advance R leg, and turn RW to stand step to R bed > recliner.    Ambulation/Gait Ambulation/Gait assistance: Mod assist Gait Distance (Feet): 2 Feet Assistive device: Rolling walker (2 wheels) Gait Pattern/deviations: Step-to pattern, Decreased step length - right, Decreased stance time - right, Decreased weight  shift to right, Decreased stride length, Antalgic Gait velocity: reduced Gait velocity  interpretation: <1.31 ft/sec, indicative of household ambulator   General Gait Details: ModA to cue weight shifting, advance R leg, and turn RW to stand step to R bed > recliner. Pt having difficulty accepting weight on R leg and moving R leg to step, needing assistance.  Stairs            Wheelchair Mobility    Modified Rankin (Stroke Patients Only)       Balance Overall balance assessment: Needs assistance Sitting-balance support: Single extremity supported, Bilateral upper extremity supported, Feet supported Sitting balance-Leahy Scale: Poor Sitting balance - Comments: Min guard assist- minA for static sitting balance, posterior bias Postural control: Posterior lean Standing balance support: Bilateral upper extremity supported, During functional activity, Reliant on assistive device for balance Standing balance-Leahy Scale: Poor Standing balance comment: reliant on RW and external physical assistance                             Pertinent Vitals/Pain Pain Assessment Pain Assessment: Faces Faces Pain Scale: Hurts even more Pain Location: R hip Pain Descriptors / Indicators: Discomfort, Grimacing, Operative site guarding Pain Intervention(s): Limited activity within patient's tolerance, Monitored during session, Premedicated before session, Repositioned    Home Living Family/patient expects to be discharged to:: Private residence Living Arrangements: Alone Available Help at Discharge: Family;Friend(s);Available 24 hours/day Type of Home: House Home Access: Stairs to enter Entrance Stairs-Rails: Right;Left (in front; none in garage) Entrance Stairs-Number of Steps: 4-6 (4 in garage without rails, 6 in front with R & L rails) Alternate Level Stairs-Number of Steps: 12 Home Layout: Two level;1/2 bath on main level;Bed/bath upstairs (can stay in recline on main level) Home Equipment: Rollator (4 wheels);Other (comment) (rails to attach to bed)      Prior  Function Prior Level of Function : Independent/Modified Independent             Mobility Comments: does not use AD       Hand Dominance        Extremity/Trunk Assessment   Upper Extremity Assessment Upper Extremity Assessment: Defer to OT evaluation    Lower Extremity Assessment Lower Extremity Assessment: RLE deficits/detail RLE Deficits / Details: noted edema around hip and thigh; pain impacting AROM and strength and WB tolerance    Cervical / Trunk Assessment Cervical / Trunk Assessment: Normal  Communication   Communication: No difficulties  Cognition Arousal/Alertness: Awake/alert Behavior During Therapy: WFL for tasks assessed/performed Overall Cognitive Status: No family/caregiver present to determine baseline cognitive functioning                                 General Comments: Pt just received dilaudid, and pt aware she was drifitng off during conversations at times. Pt also repetitive with questionable memory and needs cues for sequencing mobility, but unsure if related to meds or baseline.        General Comments General comments (skin integrity, edema, etc.): pt became diaphoretic, pale, and reported feeling lightheaded after standing to transfer to recliner. reclined pt and took BP with it reading 117 SBP, improved to 120s with prolonged recline; SpO2 dropping at one point while resting to 86% but otherwise >/= 92% on RA, re-donned [place] at 2L O2; modified L Dix Hallpike with noted upward and R nystagmus lasting > 90 sec, potential R posterior  cupulolithiasis? - performed x1 rep of Semont Maneuver    Exercises Other Exercises Other Exercises: performed x1 rep of Semont Maneuver for potential R posterior cupulolithiasis   Assessment/Plan    PT Assessment Patient needs continued PT services  PT Problem List Decreased strength;Decreased range of motion;Decreased activity tolerance;Decreased balance;Decreased mobility;Decreased knowledge of use  of DME;Cardiopulmonary status limiting activity;Pain       PT Treatment Interventions DME instruction;Gait training;Stair training;Functional mobility training;Therapeutic activities;Therapeutic exercise;Balance training;Neuromuscular re-education;Patient/family education    PT Goals (Current goals can be found in the Care Plan section)  Acute Rehab PT Goals Patient Stated Goal: to get better PT Goal Formulation: With patient Time For Goal Achievement: 07/16/22 Potential to Achieve Goals: Good    Frequency Min 5X/week     Co-evaluation               AM-PAC PT "6 Clicks" Mobility  Outcome Measure Help needed turning from your back to your side while in a flat bed without using bedrails?: A Little Help needed moving from lying on your back to sitting on the side of a flat bed without using bedrails?: A Lot Help needed moving to and from a bed to a chair (including a wheelchair)?: A Lot Help needed standing up from a chair using your arms (e.g., wheelchair or bedside chair)?: A Lot Help needed to walk in hospital room?: Total Help needed climbing 3-5 steps with a railing? : Total 6 Click Score: 11    End of Session Equipment Utilized During Treatment: Gait belt;Oxygen Activity Tolerance: Patient tolerated treatment well;Other (comment) (limited by dizziness) Patient left: in chair;with call bell/phone within reach;with chair alarm set Nurse Communication: Mobility status;Other (comment) (vitals) PT Visit Diagnosis: Unsteadiness on feet (R26.81);Other abnormalities of gait and mobility (R26.89);Muscle weakness (generalized) (M62.81);History of falling (Z91.81);Difficulty in walking, not elsewhere classified (R26.2);Dizziness and giddiness (R42);BPPV;Pain BPPV - Right/Left : Right (posterior canal) Pain - Right/Left: Right Pain - part of body: Hip    Time: 1449-1600 PT Time Calculation (min) (ACUTE ONLY): 71 min   Charges:   PT Evaluation $PT Eval Moderate Complexity: 1  Mod PT Treatments $Therapeutic Activity: 53-67 mins        Raymond Gurney, PT, DPT Acute Rehabilitation Services  Office: [phone]   Jewel Baize 07/02/2022, 4:26 PM

## 2022-07-02 NOTE — Plan of Care (Signed)

## 2022-07-02 NOTE — Progress Notes (Signed)
Gave patient belongings from PACU to patient

## 2022-07-02 NOTE — Anesthesia Postprocedure Evaluation (Signed)
Anesthesia Post Note  Patient: Amber Ruiz  Procedure(s) Performed: INTRAMEDULLARY (IM) NAIL INTERTROCHANTERIC (Right)     Patient location during evaluation: PACU Anesthesia Type: General Level of consciousness: sedated Pain management: pain level controlled Vital Signs Assessment: post-procedure vital signs reviewed and stable Respiratory status: spontaneous breathing and respiratory function stable Cardiovascular status: stable Postop Assessment: no apparent nausea or vomiting Anesthetic complications: no   No notable events documented.  Last Vitals:  Vitals:   07/02/22 1010 07/02/22 1037  BP: 129/62 108/64  Pulse: 75 71  Resp: 15 15  Temp: 36.8 C 36.8 C  SpO2: 96% 96%    Last Pain:  Vitals:   07/02/22 1037  TempSrc: Oral  PainSc:                  Sheretta Grumbine DANIEL

## 2022-07-02 NOTE — Progress Notes (Signed)
PROGRESS NOTE    Amber Ruiz  AOZ:[id number] DOB: [birth year] DOA: 07/01/2022 PCP: Forrest Moron, MD    Brief Narrative:   Amber Ruiz is a 81 y.o. female with medical history significant of auricles, HTN, OCD, anxiety/depression, presented with fall.  Trauma scan showed right comminuted inter-trochanter fracture and she is s/p repair.  Assessment and Plan:  Right hip fracture -ORIF tomorrow -s/p Cephalomedullary nailing of right intertrochanteric femur fracture PT/OT eval   Acute blood loss anemia -Secondary to hip fracture -cbc daily   Hx of Vertigo -As per patient, her symptoms not compatible with her past flareup of vertigo. -PT eval   HTN -resume meds as able   ? Qtc prolongation  -tele showed normal Qtc    DVT prophylaxis: enoxaparin (LOVENOX) injection 40 mg Start: 07/03/22 1000 SCDs Start: 07/02/22 1107    Code Status: Full Code Family Communication:   Disposition Plan:  Level of care: Med-Surg Status is: Inpatient Remains inpatient appropriate because: PT/OT    Consultants:  ortho   Subjective: Just back from surgery  Objective: Vitals:   07/02/22 0940 07/02/22 0955 07/02/22 1010 07/02/22 1037  BP: 132/62 (P) 129/66 129/62 108/64  Pulse: 72 73 75 71  Resp: 16 17 15 15   Temp:   98.2 F (36.8 C) 98.3 F (36.8 C)  TempSrc:    Oral  SpO2: 91% 93% 96% 96%  Weight:      Height:        Intake/Output Summary (Last 24 hours) at 07/02/2022 1250 Last data filed at 07/02/2022 0840 Gross per 24 hour  Intake 1000 ml  Output 50 ml  Net 950 ml   Filed Weights   07/02/22 0704  Weight: 61.2 kg    Examination:   General: Appearance:     Overweight female in no acute distress     Lungs:     respirations unlabored  Heart:    Normal heart rate. Normal rhythm. No murmurs, rubs, or gallops.    MS:   All extremities are intact.    Neurologic:   Awake, alert       Data Reviewed: I have personally reviewed following labs and imaging  studies  CBC: Recent Labs  Lab 07/01/22 1539 07/02/22 0045  WBC 10.9* 8.8  NEUTROABS 9.0*  --   HGB 10.7* 10.0*  HCT 34.4* 32.5*  MCV 86.6 86.4  PLT 209 226   Basic Metabolic Panel: Recent Labs  Lab 07/01/22 1539 07/02/22 1123  NA 139  --   K 4.3  --   CL 108  --   CO2 25  --   GLUCOSE 137*  --   BUN 9  --   CREATININE 0.68 0.90  CALCIUM 7.9*  --    GFR: Estimated Creatinine Clearance: 40.1 mL/min (by C-G formula based on SCr of 0.9 mg/dL). Liver Function Tests: No results for input(s): "AST", "ALT", "ALKPHOS", "BILITOT", "PROT", "ALBUMIN" in the last 168 hours. No results for input(s): "LIPASE", "AMYLASE" in the last 168 hours. No results for input(s): "AMMONIA" in the last 168 hours. Coagulation Profile: Recent Labs  Lab 07/01/22 1539  INR 1.1   Cardiac Enzymes: Recent Labs  Lab 07/01/22 1539  CKTOTAL 205   BNP (last 3 results) No results for input(s): "PROBNP" in the last 8760 hours. HbA1C: No results for input(s): "HGBA1C" in the last 72 hours. CBG: No results for input(s): "GLUCAP" in the last 168 hours. Lipid Profile: No results for input(s): "CHOL", "HDL", "LDLCALC", "TRIG", "CHOLHDL", "LDLDIRECT" in  the last 72 hours. Thyroid Function Tests: No results for input(s): "TSH", "T4TOTAL", "FREET4", "T3FREE", "THYROIDAB" in the last 72 hours. Anemia Panel: No results for input(s): "VITAMINB12", "FOLATE", "FERRITIN", "TIBC", "IRON", "RETICCTPCT" in the last 72 hours. Sepsis Labs: No results for input(s): "PROCALCITON", "LATICACIDVEN" in the last 168 hours.  Recent Results (from the past 240 hour(s))  Surgical pcr screen     Status: None   Collection Time: 07/02/22  7:06 AM   Specimen: Nasal Mucosa; Nasal Swab  Result Value Ref Range Status   MRSA, PCR NEGATIVE NEGATIVE Final   Staphylococcus aureus NEGATIVE NEGATIVE Final    Comment: (NOTE) The Xpert SA Assay (FDA approved for NASAL specimens in patients 48 years of age and older), is one  component of a comprehensive surveillance program. It is not intended to diagnose infection nor to guide or monitor treatment. Performed at [facility], [address], [place], [place] [address]          Radiology Studies: DG FEMUR, [place] 2 VIEWS RIGHT  Result Date: 07/02/2022 CLINICAL DATA:  [id number]; [phone]; ORIF right femoral neck EXAM: Three views, 6 images of the right femur and right hip were obtained intraoperatively without presence of radiologist. Fluoro time: 53 seconds.  Fluoro dose: 6.28 mGy COMPARISON:  July 01, 2022 FINDINGS: There are ORIF changes seen with intramedullary rod and compression screw devices transfixing the right intertrochanteric fracture of the femur. The alignment is near anatomical. Please see intraoperative findings for further detail. IMPRESSION: ORIF changes with intramedullary rod and compressive screw devices transfix the intertrochanteric fracture of the right femur. The alignment is near anatomical. Electronically Signed   By: Marjo Bicker M.D.   On: 07/02/2022 10:10   DG HIP PORT UNILAT W OR W/O PELVIS 1V RIGHT  Result Date: 07/02/2022 CLINICAL DATA:  [id number]; [id number]; ORIF right femoral neck EXAM: Three views, 6 images of the right femur and right hip were obtained intraoperatively without presence of radiologist. Fluoro time: 53 seconds.  Fluoro dose: 6.28 mGy COMPARISON:  July 01, 2022 FINDINGS: There are ORIF changes seen with intramedullary rod and compression screw devices transfixing the right intertrochanteric fracture of the femur. The alignment is near anatomical. Please see intraoperative findings for further detail. IMPRESSION: ORIF changes with intramedullary rod and compressive screw devices transfix the intertrochanteric fracture of the right femur. The alignment is near anatomical. Electronically Signed   By: Marjo Bicker M.D.   On: 07/02/2022 10:10   CT Knee Right Wo Contrast  Result Date: 07/01/2022 CLINICAL DATA:  Possible  undisplaced fracture in the distal femur on recent plain film examination EXAM: CT OF THE RIGHT KNEE WITHOUT CONTRAST TECHNIQUE: Multidetector CT imaging of the right knee was performed according to the standard protocol. Multiplanar CT image reconstructions were also generated. RADIATION DOSE REDUCTION: This exam was performed according to the departmental dose-optimization program which includes automated exposure control, adjustment of the mA and/or kV according to patient size and/or use of iterative reconstruction technique. COMPARISON:  Plain film from earlier in the same day. FINDINGS: Bones/Joint/Cartilage Distal femur shows no evidence of acute fracture. The lucencies are felt to be related to variation in the mineralization laterally. The proximal tibia and fibula are well visualized without evidence of acute fracture. Subchondral sclerosis is noted in the medial tibial plateau consistent with mild degenerative change. Some osteophytic changes are noted as well. Patella appears within normal limits. Ligaments Suboptimally assessed by CT. Muscles and Tendons Surrounding musculature appears within normal limits. Soft  tissues No significant soft tissue edema is noted. No sizable joint effusion is seen. IMPRESSION: No evidence of acute fracture. Lucency seen previously are related to the degree of mineralization in the distal femur laterally. Mild degenerative changes of the medial knee joint are seen. Electronically Signed   By: Alcide Clever M.D.   On: 07/01/2022 19:26   CT HEAD WO CONTRAST  Result Date: 07/01/2022 CLINICAL DATA:  81 year old female with headache and neck pain following fall. EXAM: CT HEAD WITHOUT CONTRAST CT CERVICAL SPINE WITHOUT CONTRAST TECHNIQUE: Multidetector CT imaging of the head and cervical spine was performed following the standard protocol without intravenous contrast. Multiplanar CT image reconstructions of the cervical spine were also generated. RADIATION DOSE REDUCTION:  This exam was performed according to the departmental dose-optimization program which includes automated exposure control, adjustment of the mA and/or kV according to patient size and/or use of iterative reconstruction technique. COMPARISON:  05/07/2022 CTA head, 07/24/2019 brain MR, 03/24/2012 CT cervical spine and prior studies FINDINGS: CT HEAD FINDINGS Brain: No evidence of acute infarction, hemorrhage, hydrocephalus, extra-axial collection or mass lesion/mass effect. Atrophy and chronic small-vessel white matter ischemic changes again noted. Vascular: Carotid and vertebral atherosclerotic calcifications are noted. Skull: Normal. Negative for fracture or focal lesion. Sinuses/Orbits: No acute finding. Other: RIGHT posterior scalp soft tissue swelling is noted. CT CERVICAL SPINE FINDINGS Alignment: Normal. Skull base and vertebrae: No acute fracture. 30% compression of C7 is unchanged from 2013. No primary bone lesion or focal pathologic process. Soft tissues and spinal canal: No prevertebral fluid or swelling. No visible canal hematoma. 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 Upper chest: No acute abnormality. Other: None IMPRESSION: 1. No evidence of acute intracranial abnormality. Atrophy and chronic small-vessel white matter ischemic changes. 2. RIGHT posterior scalp soft tissue swelling without fracture. 3. No static evidence of acute injury to the cervical spine. Electronically Signed   By: Harmon Pier M.D.   On: 07/01/2022 16:19   CT CERVICAL SPINE WO CONTRAST  Result Date: 07/01/2022 CLINICAL DATA:  81 year old female with headache and neck pain following fall. EXAM: CT HEAD WITHOUT CONTRAST CT CERVICAL SPINE WITHOUT CONTRAST TECHNIQUE: Multidetector CT imaging of the head and cervical spine was performed following the standard protocol without intravenous contrast. Multiplanar CT image reconstructions of the cervical spine were also generated. RADIATION DOSE  REDUCTION: This exam was performed according to the departmental dose-optimization program which includes automated exposure control, adjustment of the mA and/or kV according to patient size and/or use of iterative reconstruction technique. COMPARISON:  05/07/2022 CTA head, 07/24/2019 brain MR, 03/24/2012 CT cervical spine and prior studies FINDINGS: CT HEAD FINDINGS Brain: No evidence of acute infarction, hemorrhage, hydrocephalus, extra-axial collection or mass lesion/mass effect. Atrophy and chronic small-vessel white matter ischemic changes again noted. Vascular: Carotid and vertebral atherosclerotic calcifications are noted. Skull: Normal. Negative for fracture or focal lesion. Sinuses/Orbits: No acute finding. Other: RIGHT posterior scalp soft tissue swelling is noted. CT CERVICAL SPINE FINDINGS Alignment: Normal. Skull base and vertebrae: No acute fracture. 30% compression of C7 is unchanged from 2013. No primary bone lesion or focal pathologic process. Soft tissues and spinal canal: No prevertebral fluid or swelling. No visible canal hematoma. 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 Upper chest: No acute abnormality. Other: None IMPRESSION: 1. No evidence of acute intracranial abnormality. Atrophy and chronic small-vessel white matter ischemic changes. 2. RIGHT posterior scalp soft tissue swelling without fracture. 3. No static evidence of acute injury to the  cervical spine. Electronically Signed   By: Harmon Pier M.D.   On: 07/01/2022 16:19   DG Knee 2 Views Right  Result Date: 07/01/2022 CLINICAL DATA:  Trauma, fall EXAM: RIGHT KNEE - 1-2 VIEW COMPARISON:  None Available. FINDINGS: No displaced fracture is seen. There is no significant effusion in suprapatellar bursa. In the AP view, there is faint vertically oriented lucency in the lateral aspect of distal femur which could not be distinctly seen in the lateral view. IMPRESSION: No displaced fracture is seen in right  knee. There is no significant effusion. There are faint linear lucencies in the lateral aspect of distal femur seen only in the AP projection. This may be an artifact or suggest undisplaced fracture. Please correlate with clinical physical examination findings and consider four view examination of right knee if clinically warranted. Electronically Signed   By: Ernie Avena M.D.   On: 07/01/2022 15:45   DG Hip Unilat With Pelvis 2-3 Views Right  Result Date: 07/01/2022 CLINICAL DATA:  Trauma, fall EXAM: DG HIP (WITH OR WITHOUT PELVIS) 2-3V RIGHT COMPARISON:  None Available. FINDINGS: Comminuted intertrochanteric fracture is seen in proximal right femur. There is overriding of fracture fragments. There is medial displacement of lesser trochanter. There is no dislocation. IMPRESSION: Comminuted, displaced intertrochanteric fracture is seen in proximal right femur. Electronically Signed   By: Ernie Avena M.D.   On: 07/01/2022 15:41   DG FEMUR, MIN 2 VIEWS RIGHT  Result Date: 07/01/2022 CLINICAL DATA:  Trauma, fall EXAM: RIGHT FEMUR 2 VIEWS COMPARISON:  None Available. FINDINGS: Comminuted intertrochanteric fracture is seen proximal right femur. There is superior displacement of distal major fracture fragment. There is no dislocation. IMPRESSION: Comminuted, displaced intertrochanteric fracture is seen in proximal right femur. Electronically Signed   By: Ernie Avena M.D.   On: 07/01/2022 15:40   DG Pelvis 1-2 Views  Result Date: 07/01/2022 CLINICAL DATA:  Pain after fall EXAM: PELVIS - 1-2 VIEW COMPARISON:  None Available. FINDINGS: There is a comminuted angulated/displaced right intertrochanteric femoral fracture. IMPRESSION: Comminuted angulated/displaced right intertrochanteric hip fracture. Electronically Signed   By: Gerome Sam III M.D.   On: 07/01/2022 14:10        Scheduled Meds:  citalopram  20 mg Oral Daily   docusate sodium  100 mg Oral BID   [START ON 07/03/2022]  enoxaparin (LOVENOX) injection  40 mg Subcutaneous Q24H   fentaNYL       ferrous sulfate  325 mg Oral Daily   loratadine  10 mg Oral Daily   metoprolol tartrate  12.5 mg Oral BID   pantoprazole  20 mg Oral Daily   Continuous Infusions:  sodium chloride 50 mL/hr at 07/02/22 1129   ceFAZolin      ceFAZolin (ANCEF) IV     tranexamic acid       LOS: 1 day    Time spent: 45 minutes spent on chart review, discussion with nursing staff, consultants, updating family and interview/physical exam; more than 50% of that time was spent in counseling and/or coordination of care.    Joseph Art, DO [facility] Available via Epic secure chat 7am-7pm After these hours, please refer to coverage provider listed on [email or web address] 07/02/2022, 12:50 PM

## 2022-07-02 NOTE — Transfer of Care (Signed)
Immediate Anesthesia Transfer of Care Note  Patient: Amber Ruiz  Procedure(s) Performed: INTRAMEDULLARY (IM) NAIL INTERTROCHANTERIC (Right)  Patient Location: PACU  Anesthesia Type:General  Level of Consciousness: awake and drowsy  Airway & Oxygen Therapy: Patient Spontanous Breathing and Patient connected to face mask oxygen  Post-op Assessment: Report given to RN and Post -op Vital signs reviewed and stable  Post vital signs: Reviewed and stable  Last Vitals:  Vitals Value Taken Time  BP 136/60 07/02/22 0906  Temp    Pulse 82 07/02/22 0911  Resp 19 07/02/22 0911  SpO2 93 % 07/02/22 0911  Vitals shown include unvalidated device data.  Last Pain:  Vitals:   07/02/22 0657  TempSrc: Oral  PainSc:          Complications: No notable events documented.

## 2022-07-02 NOTE — Interval H&P Note (Signed)
History and Physical Interval Note:  07/02/2022 7:35 AM  Amber Ruiz  has presented today for surgery, with the diagnosis of Right intertrochanteric femur fracture.  The various methods of treatment have been discussed with the patient and family. After consideration of risks, benefits and other options for treatment, the patient has consented to  Procedure(s): INTRAMEDULLARY (IM) NAIL INTERTROCHANTERIC (Right) as a surgical intervention.  The patient's history has been reviewed, patient examined, no change in status, stable for surgery.  I have reviewed the patient's chart and labs.  Questions were answered to the patient's satisfaction.     Caryn Bee P Paschal Blanton

## 2022-07-02 NOTE — Consult Note (Signed)
Orthopaedic Trauma Service (OTS) Consult   Patient ID: Amber Ruiz MRN: [id number] DOB/AGE: 81/14/1942 81 y.o.  Reason for Consult:Right intertrochanteric femur fracture Referring Physician: Dr. Robert Paterson, MD [facility]  HPI: Amber Ruiz is an 81 y.o. female who is being seen at the request of Dr. Patterson for evaluation of right intertrochanteric femur fracture.  The patient lives alone in fell overnight landing on her hip had immediate pain and could not call anyone.  Her son-in-law went to check on her in the morning and found her and called EMS.  She was brought in yesterday.  She currently comfortable.  She had a nerve block last night.  She is otherwise independently living and ambulatory without assist device.  No major medical problems.  Patient was seen and evaluated in the preoperative holding area.  Denies any significant numbness or tingling.  Denies any other injuries.  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 No pertinent family history.  Social History:  reports that she has never smoked. She has never used smokeless tobacco. She reports that she does not drink alcohol. No history on file for drug use.  Allergies: No Known Allergies  Medications:  No current facility-administered medications on file prior to encounter.   Current Outpatient Medications on File Prior to Encounter  Medication Sig Dispense Refill   acetaminophen (TYLENOL) 500 MG tablet Take 1,000 mg by mouth every 6 (six) hours as needed for mild pain.     alendronate (FOSAMAX) 70 MG tablet Take 70 mg by mouth once a week.     cetirizine (ZYRTEC) 5 MG tablet Take 5 mg by mouth daily.     citalopram (CELEXA) 40 MG tablet Take 20 mg by mouth daily.       FEROSUL 325 (65 Fe) MG tablet Take 325 mg by mouth daily.     lisinopril (ZESTRIL) 10 MG tablet Take 10 mg by mouth daily.     pantoprazole (PROTONIX) 20 MG tablet Take 20 mg by mouth daily.       ROS: Constitutional: No fever or chills Vision: No changes in vision ENT: No difficulty swallowing CV: No chest pain Pulm: No SOB or wheezing GI: No nausea or vomiting GU: No urgency or inability to hold urine Skin: No poor wound healing Neurologic: No numbness or tingling Psychiatric: No depression or anxiety Heme: No bruising Allergic: No reaction to medications or food   Exam: Blood pressure 118/64, pulse 65, temperature 98.3 F (36.8 C), temperature source Oral, resp. rate 16, height 5' (1.524 m), weight 61.2 kg, SpO2 92 %. General: No acute distress Orientation: Awake alert and oriented x3 Mood and Affect: Cooperative and pleasant Gait: Unable to assess due to her fracture Coordination and balance: Within normal limits  Right lower extremity: Buck's traction is in place.  There is some shortening of the lower extremity.  Swelling through the right hip.  No skin lesions no lacerations.  She is actively able to dorsiflex and plantarflex the foot and toes.  Warm well-perfused foot with sensation is intact to light touch throughout the lower extremity.  Left lower extremity: Skin without lesions. No tenderness to palpation. Full painless ROM, full strength in each muscle   groups without evidence of instability.   Medical Decision Making: Data: Imaging: X-rays of the right hip and CT scan of the knee are reviewed which shows an intertrochanteric femur fracture.  There is no significant bony injury to the right knee.  Labs:  Results for orders placed or performed during the hospital encounter of 07/01/22 (from the past 24 hour(s))  Type and screen [facility]     Status: None   Collection Time: 07/01/22  3:38 PM  Result Value Ref Range   ABO/RH(D) A POS     Antibody Screen NEG    Sample Expiration      07/04/2022,2359 Performed at [facility], [address], [place], [place] [address]   Basic metabolic panel     Status: Abnormal   Collection Time: 07/01/22  3:39 PM  Result Value Ref Range   Sodium 139 135 - 145 mmol/L   Potassium 4.3 3.5 - 5.1 mmol/L   Chloride 108 98 - 111 mmol/L   CO2 25 22 - 32 mmol/L   Glucose, Bld 137 (H) 70 - 99 mg/dL   BUN 9 8 - 23 mg/dL   Creatinine, Ser 8.67 0.44 - 1.00 mg/dL   Calcium 7.9 (L) 8.9 - 10.3 mg/dL   GFR, Estimated >61 >95 mL/min   Anion gap 6 5 - 15  CBC with Differential     Status: Abnormal   Collection Time: 07/01/22  3:39 PM  Result Value Ref Range   WBC 10.9 (H) 4.0 - 10.5 K/uL   RBC 3.97 3.87 - 5.11 MIL/uL   Hemoglobin 10.7 (L) 12.0 - 15.0 g/dL   HCT 09.3 (L) 26.7 - 12.4 %   MCV 86.6 80.0 - 100.0 fL   MCH 27.0 26.0 - 34.0 pg   MCHC 31.1 30.0 - 36.0 g/dL   RDW 58.0 99.8 - 33.8 %   Platelets 209 150 - 400 K/uL   nRBC 0.0 0.0 - 0.2 %   Neutrophils Relative % 83 %   Neutro Abs 9.0 (H) 1.7 - 7.7 K/uL   Lymphocytes Relative 9 %   Lymphs Abs 1.0 0.7 - 4.0 K/uL   Monocytes Relative 8 %   Monocytes Absolute 0.9 0.1 - 1.0 K/uL   Eosinophils Relative 0 %   Eosinophils Absolute 0.0 0.0 - 0.5 K/uL   Basophils Relative 0 %   Basophils Absolute 0.0 0.0 - 0.1 K/uL   Immature Granulocytes 0 %   Abs Immature Granulocytes 0.04 0.00 - 0.07 K/uL  Protime-INR     Status: None   Collection Time: 07/01/22  3:39 PM  Result Value Ref Range   Prothrombin Time 14.2 11.4 - 15.2 seconds   INR 1.1 0.8 - 1.2  APTT     Status: Abnormal   Collection Time: 07/01/22  3:39 PM  Result Value Ref Range   aPTT 21 (L) 24 - 36 seconds  CK     Status: None   Collection Time: 07/01/22  3:39 PM  Result Value Ref Range   Total CK 205 38 - 234 U/L  ABO/Rh     Status: None   Collection Time: 07/02/22 12:42 AM  Result Value Ref Range   ABO/RH(D)      A POS Performed at [facility], [address], [place], [place] [address]   CBC     Status: Abnormal   Collection Time: 07/02/22 12:45 AM  Result Value Ref Range   WBC 8.8 4.0 - 10.5  K/uL   RBC 3.76 (L) 3.87 - 5.11 MIL/uL   Hemoglobin 10.0 (L) 12.0 - 15.0 g/dL   HCT 81.4 (L) 48.1 - 85.6 %   MCV 86.4 80.0 - 100.0 fL   MCH 26.6 26.0 - 34.0 pg   MCHC 30.8 30.0 - 36.0 g/dL   RDW 31.4 97.0 - 26.3 %   Platelets 226 150 - 400 K/uL   nRBC 0.0 0.0 - 0.2 %     Imaging or Labs ordered: None  Medical history and chart was reviewed and case discussed with medical provider.  Assessment/Plan: 81 year old female with right intertrochanteric femur fracture.  We will plan to proceed with cephalomedullary nailing of her right hip.  Risks and benefits were discussed with the patient.  Risks include but not limited to bleeding, infection, malunion, nonunion, hardware failure, hardware irritation, nerve or vessel injury, DVT, even the possibility of anesthetic complications.  She agrees to proceed with surgery and consent was obtained.  Roby Lofts, MD [facility] [phone] (office) [email or web address]

## 2022-07-02 NOTE — Op Note (Addendum)
Orthopaedic Surgery Operative Note (CSN: [id number] ) Date of Surgery: 07/02/2022  Admit Date: 07/01/2022   Diagnoses: Pre-Op Diagnoses: Right intertrochanteric femur fracture  Post-Op Diagnosis: Same  Procedures: CPT 27245-Cephalomedullary nailing of right intertrochanteric femur fracture  Surgeons : Primary: Shona Needles, MD  Assistant: Patrecia Pace, PA-C  Location: OR 3   Anesthesia:General   Antibiotics: Ancef 2g preop   Tourniquet time:None    Estimated Blood TMHD:62 mL  Complications: None  Specimens:None   Implants: Implant Name Type Inv. Item Serial No. Manufacturer Lot No. LRB No. Used Action  SCREW LAG COMPR KIT 95/90 - [id number] Screw SCREW LAG COMPR KIT 95/90  [employer] [id number] Right 1 Implanted  SCREW TRIGEN LOW PROF 5.0X35 - [id number] Screw SCREW TRIGEN LOW PROF 5.0X35  [employer] [id number] Right 1 Implanted  NAIL INTERTAN 10X18 130D 10S - [id number] Nail NAIL INTERTAN 10X18 130D 10S  [employer] [id number] Right 1 Implanted     Indications for Surgery: 81 year old female who sustained a ground-level fall and a right intertrochanteric femur fracture.  Due to the unstable nature of her injury I felt that she required a cephalomedullary nailing of her right hip.  Risks and benefits were discussed with both the patient and her son.  Risk include but not limited to bleeding, infection, malunion, nonunion, hardware failure, hardware irritation, nerve or blood vessel injury, DVT, even possibility anesthetic complications.  She agreed to proceed with surgery and consent was obtained.  Operative Findings: Cephalomedullary nailing of right intertrochanteric femur fracture using [employer] InterTAN 10 x 180 mm nail with a 95 mm lag screw  Procedure: The patient was identified in the preoperative holding area. Consent was confirmed with the patient and their family and all questions were answered. The  operative extremity was marked after confirmation with the patient. she was then brought back to the operating room by our anesthesia colleagues.  She was placed under general anesthetic and carefully transferred over to a Hana table.  All bony prominences were well-padded.  Traction was applied to the right lower extremity under fluoroscopic imaging.  Adequate reduction was obtained.  The right lower extremity was then prepped and draped in usual sterile fashion.  A timeout was performed to verify the patient, the procedure, and the extremity.  Preoperative antibiotics were dosed.  A small incision proximal to the greater trochanter was made and carried down through skin and subcutaneous tissue.  I then directed a threaded guidepin at the appropriate starting point and advanced into the proximal metaphysis.  I confirmed positioning and then used an entry reamer to enter the medullary canal.  I then passed a 10 x 180 mm nail down the center of the canal.  Using the targeting arm I then directed a threaded guidewire up into the head/neck segment of the femur.  I confirmed adequate tip apex distance using fluoroscopy then measured the length and chose to use a 95 mm lag screw.  I then drilled the path for the compression screw.  I placed an antirotation bar.  I then drilled the path for the lag screw.  The lag screw was placed.  Compression was applied and a nearly 5 to 7 mm of compression through the fracture was obtained.  The nail was then statically locked.  I then used the targeting arm to place a distal interlocking screw.  The targeting arm was then removed.  Final fluoroscopic imaging was obtained.  The incisions were irrigated.  They were closed with 2-0 Vicryl and 3-0 Monocryl with Dermabond.  Sterile dressings were applied.  The patient was then awoke from anesthesia and taken to the PACU in stable condition.  Post Op Plan/Instructions: Patient will be weightbearing as tolerated to the right lower  extremity.  She will receive postoperative Ancef.  She will receive Lovenox for DVT prophylaxis and discharged on a oral DOAC for 1 month.  We will have her mobilize with physical and Occupational Therapy.  I was present and performed the entire surgery.  Patrecia Pace, PA-C did assist me throughout the case. An assistant was necessary given the difficulty in approach, maintenance of reduction and ability to instrument the fracture.   Katha Hamming, MD [facility]

## 2022-07-03 ENCOUNTER — Encounter (HOSPITAL_COMMUNITY): Payer: Self-pay | Admitting: Student

## 2022-07-03 ENCOUNTER — Inpatient Hospital Stay (HOSPITAL_COMMUNITY)
Admission: RE | Admit: 2022-07-03 | Discharge: 2022-07-21 | DRG: 560 | Disposition: A | Discharge status: Home Health | Payer: Medicare Other | Source: Intra-hospital | Attending: Physical Medicine and Rehabilitation | Admitting: Physical Medicine and Rehabilitation

## 2022-07-03 DIAGNOSIS — K5900 Constipation, unspecified: Secondary | ICD-10-CM | POA: Diagnosis present

## 2022-07-03 DIAGNOSIS — E559 Vitamin D deficiency, unspecified: Secondary | ICD-10-CM | POA: Diagnosis not present

## 2022-07-03 DIAGNOSIS — G4700 Insomnia, unspecified: Secondary | ICD-10-CM | POA: Diagnosis present

## 2022-07-03 DIAGNOSIS — Z7983 Long term (current) use of bisphosphonates: Secondary | ICD-10-CM | POA: Diagnosis not present

## 2022-07-03 DIAGNOSIS — N390 Urinary tract infection, site not specified: Secondary | ICD-10-CM | POA: Diagnosis not present

## 2022-07-03 DIAGNOSIS — R600 Localized edema: Secondary | ICD-10-CM | POA: Diagnosis not present

## 2022-07-03 DIAGNOSIS — R410 Disorientation, unspecified: Secondary | ICD-10-CM | POA: Diagnosis not present

## 2022-07-03 DIAGNOSIS — Z90710 Acquired absence of both cervix and uterus: Secondary | ICD-10-CM | POA: Diagnosis not present

## 2022-07-03 DIAGNOSIS — R339 Retention of urine, unspecified: Secondary | ICD-10-CM | POA: Diagnosis not present

## 2022-07-03 DIAGNOSIS — S7291XA Unspecified fracture of right femur, initial encounter for closed fracture: Secondary | ICD-10-CM | POA: Diagnosis present

## 2022-07-03 DIAGNOSIS — F419 Anxiety disorder, unspecified: Secondary | ICD-10-CM | POA: Diagnosis present

## 2022-07-03 DIAGNOSIS — F411 Generalized anxiety disorder: Secondary | ICD-10-CM | POA: Diagnosis present

## 2022-07-03 DIAGNOSIS — R531 Weakness: Secondary | ICD-10-CM | POA: Diagnosis present

## 2022-07-03 DIAGNOSIS — H5500 Unspecified nystagmus: Secondary | ICD-10-CM | POA: Diagnosis present

## 2022-07-03 DIAGNOSIS — D72823 Leukemoid reaction: Secondary | ICD-10-CM | POA: Diagnosis not present

## 2022-07-03 DIAGNOSIS — D62 Acute posthemorrhagic anemia: Secondary | ICD-10-CM

## 2022-07-03 DIAGNOSIS — S72141S Displaced intertrochanteric fracture of right femur, sequela: Secondary | ICD-10-CM

## 2022-07-03 DIAGNOSIS — S72141D Displaced intertrochanteric fracture of right femur, subsequent encounter for closed fracture with routine healing: Principal | ICD-10-CM

## 2022-07-03 DIAGNOSIS — F429 Obsessive-compulsive disorder, unspecified: Secondary | ICD-10-CM | POA: Diagnosis present

## 2022-07-03 DIAGNOSIS — I159 Secondary hypertension, unspecified: Secondary | ICD-10-CM

## 2022-07-03 DIAGNOSIS — K5901 Slow transit constipation: Secondary | ICD-10-CM | POA: Diagnosis not present

## 2022-07-03 DIAGNOSIS — Z79899 Other long term (current) drug therapy: Secondary | ICD-10-CM

## 2022-07-03 DIAGNOSIS — S7291XD Unspecified fracture of right femur, subsequent encounter for closed fracture with routine healing: Secondary | ICD-10-CM | POA: Diagnosis not present

## 2022-07-03 DIAGNOSIS — R4189 Other symptoms and signs involving cognitive functions and awareness: Secondary | ICD-10-CM | POA: Diagnosis present

## 2022-07-03 DIAGNOSIS — M8448XD Pathological fracture, other site, subsequent encounter for fracture with routine healing: Secondary | ICD-10-CM | POA: Diagnosis present

## 2022-07-03 DIAGNOSIS — B952 Enterococcus as the cause of diseases classified elsewhere: Secondary | ICD-10-CM | POA: Diagnosis not present

## 2022-07-03 DIAGNOSIS — W010XXD Fall on same level from slipping, tripping and stumbling without subsequent striking against object, subsequent encounter: Secondary | ICD-10-CM | POA: Diagnosis present

## 2022-07-03 DIAGNOSIS — Z87898 Personal history of other specified conditions: Secondary | ICD-10-CM

## 2022-07-03 DIAGNOSIS — Z8249 Family history of ischemic heart disease and other diseases of the circulatory system: Secondary | ICD-10-CM | POA: Diagnosis not present

## 2022-07-03 DIAGNOSIS — D649 Anemia, unspecified: Secondary | ICD-10-CM | POA: Diagnosis not present

## 2022-07-03 DIAGNOSIS — M7989 Other specified soft tissue disorders: Secondary | ICD-10-CM | POA: Diagnosis not present

## 2022-07-03 DIAGNOSIS — D72829 Elevated white blood cell count, unspecified: Secondary | ICD-10-CM | POA: Diagnosis present

## 2022-07-03 DIAGNOSIS — S72001S Fracture of unspecified part of neck of right femur, sequela: Secondary | ICD-10-CM | POA: Diagnosis not present

## 2022-07-03 DIAGNOSIS — S72001D Fracture of unspecified part of neck of right femur, subsequent encounter for closed fracture with routine healing: Secondary | ICD-10-CM | POA: Diagnosis not present

## 2022-07-03 HISTORY — DX: Wedge compression fracture of unspecified lumbar vertebra, initial encounter for closed fracture: S32.000A

## 2022-07-03 LAB — CBC
HCT: 28.1 % — ABNORMAL LOW (ref 36.0–46.0)
Hemoglobin: 8.8 g/dL — ABNORMAL LOW (ref 12.0–15.0)
MCH: 27.2 pg (ref 26.0–34.0)
MCHC: 31.3 g/dL (ref 30.0–36.0)
MCV: 87 fL (ref 80.0–100.0)
Platelets: 204 10*3/uL (ref 150–400)
RBC: 3.23 MIL/uL — ABNORMAL LOW (ref 3.87–5.11)
RDW: 13.9 % (ref 11.5–15.5)
WBC: 12.9 10*3/uL — ABNORMAL HIGH (ref 4.0–10.5)
nRBC: 0 % (ref 0.0–0.2)

## 2022-07-03 LAB — BASIC METABOLIC PANEL
Anion gap: 6 (ref 5–15)
BUN: 14 mg/dL (ref 8–23)
CO2: 26 mmol/L (ref 22–32)
Calcium: 7.9 mg/dL — ABNORMAL LOW (ref 8.9–10.3)
Chloride: 104 mmol/L (ref 98–111)
Creatinine, Ser: 0.89 mg/dL (ref 0.44–1.00)
GFR, Estimated: >60 mL/min (ref >60)
Glucose, Bld: 144 mg/dL — ABNORMAL HIGH (ref 70–99)
Potassium: 4.5 mmol/L (ref 3.5–5.1)
Sodium: 136 mmol/L (ref 135–145)

## 2022-07-03 MED ORDER — FLEET ENEMA 7-19 GM/118ML RE ENEM
1.0000 | ENEMA | Freq: Once | RECTAL | Status: DC | PRN
Start: 2022-07-03 — End: 2022-07-21

## 2022-07-03 MED ORDER — VITAMIN D (ERGOCALCIFEROL) 1.25 MG (50000 UNIT) PO CAPS
50000.0000 [IU] | ORAL_CAPSULE | ORAL | Status: DC
  Administered 2022-07-03: 50000 [IU] via ORAL
  Filled 2022-07-03: qty 1

## 2022-07-03 MED ORDER — HYDROCODONE-ACETAMINOPHEN 5-325 MG PO TABS
1.0000 | ORAL_TABLET | ORAL | Status: DC | PRN
  Administered 2022-07-04: 2 via ORAL
  Administered 2022-07-04: 1 via ORAL
  Administered 2022-07-04 – 2022-07-05 (×2): 2 via ORAL
  Administered 2022-07-05: 1 via ORAL
  Administered 2022-07-05: 2 via ORAL
  Administered 2022-07-06 – 2022-07-09 (×6): 1 via ORAL
  Administered 2022-07-10: 2 via ORAL
  Administered 2022-07-10 (×2): 1 via ORAL
  Administered 2022-07-11: 2 via ORAL
  Administered 2022-07-11 – 2022-07-17 (×5): 1 via ORAL
  Filled 2022-07-03 (×4): qty 1
  Filled 2022-07-03: qty 2
  Filled 2022-07-03 (×2): qty 1
  Filled 2022-07-03: qty 2
  Filled 2022-07-03: qty 1
  Filled 2022-07-03 (×2): qty 2
  Filled 2022-07-03 (×2): qty 1
  Filled 2022-07-03: qty 2
  Filled 2022-07-03 (×5): qty 1
  Filled 2022-07-03: qty 2
  Filled 2022-07-03: qty 1
  Filled 2022-07-03: qty 2

## 2022-07-03 MED ORDER — ALUM & MAG HYDROXIDE-SIMETH 200-200-20 MG/5ML PO SUSP: 30.0000 mL | ORAL | Status: DC | PRN

## 2022-07-03 MED ORDER — PROCHLORPERAZINE 25 MG RE SUPP: 12.5000 mg | Freq: Four times a day (QID) | RECTAL | Status: DC | PRN

## 2022-07-03 MED ORDER — ENOXAPARIN SODIUM 40 MG/0.4ML IJ SOSY
40.0000 mg | PREFILLED_SYRINGE | INTRAMUSCULAR | Status: DC
  Administered 2022-07-04 – 2022-07-20 (×17): 40 mg via SUBCUTANEOUS
  Filled 2022-07-03 (×17): qty 0.4

## 2022-07-03 MED ORDER — ENSURE ENLIVE PO LIQD: 237.0000 mL | Freq: Two times a day (BID) | ORAL | Status: DC

## 2022-07-03 MED ORDER — DIPHENHYDRAMINE HCL 12.5 MG/5ML PO ELIX
12.5000 mg | ORAL_SOLUTION | Freq: Four times a day (QID) | ORAL | Status: DC | PRN
  Administered 2022-07-13 – 2022-07-20 (×4): 25 mg via ORAL
  Filled 2022-07-03 (×4): qty 10

## 2022-07-03 MED ORDER — PROCHLORPERAZINE EDISYLATE 10 MG/2ML IJ SOLN: 5.0000 mg | Freq: Four times a day (QID) | INTRAMUSCULAR | Status: DC | PRN

## 2022-07-03 MED ORDER — LORATADINE 10 MG PO TABS
10.0000 mg | ORAL_TABLET | Freq: Every day | ORAL | Status: DC
  Administered 2022-07-04 – 2022-07-21 (×18): 10 mg via ORAL
  Filled 2022-07-03 (×18): qty 1

## 2022-07-03 MED ORDER — VITAMIN D (ERGOCALCIFEROL) 1.25 MG (50000 UNIT) PO CAPS: 50000.0000 [IU] | ORAL_CAPSULE | ORAL | Status: DC

## 2022-07-03 MED ORDER — BISACODYL 10 MG RE SUPP: 10.0000 mg | Freq: Every day | RECTAL | Status: DC | PRN

## 2022-07-03 MED ORDER — ACETAMINOPHEN 325 MG PO TABS: 650.0000 mg | ORAL_TABLET | Freq: Four times a day (QID) | ORAL | Status: DC | PRN

## 2022-07-03 MED ORDER — SENNA 8.6 MG PO TABS
2.0000 | ORAL_TABLET | Freq: Once | ORAL | Status: AC
  Administered 2022-07-03: 17.2 mg via ORAL
  Filled 2022-07-03: qty 2

## 2022-07-03 MED ORDER — HYDROCODONE-ACETAMINOPHEN 5-325 MG PO TABS: 1.0000 | ORAL_TABLET | ORAL | 0 refills | Status: DC | PRN

## 2022-07-03 MED ORDER — GUAIFENESIN-DM 100-10 MG/5ML PO SYRP: 5.0000 mL | ORAL_SOLUTION | Freq: Four times a day (QID) | ORAL | Status: DC | PRN

## 2022-07-03 MED ORDER — FERROUS SULFATE 325 (65 FE) MG PO TABS
325.0000 mg | ORAL_TABLET | Freq: Two times a day (BID) | ORAL | Status: DC
  Administered 2022-07-04 – 2022-07-21 (×35): 325 mg via ORAL
  Filled 2022-07-03 (×35): qty 1

## 2022-07-03 MED ORDER — SENNOSIDES-DOCUSATE SODIUM 8.6-50 MG PO TABS
2.0000 | ORAL_TABLET | Freq: Every day | ORAL | Status: DC
  Administered 2022-07-04 – 2022-07-20 (×17): 2 via ORAL
  Filled 2022-07-03 (×17): qty 2

## 2022-07-03 MED ORDER — ENSURE ENLIVE PO LIQD
237.0000 mL | Freq: Two times a day (BID) | ORAL | Status: DC
Start: 2022-07-04 — End: 2022-07-21
  Administered 2022-07-04 – 2022-07-21 (×26): 237 mL via ORAL

## 2022-07-03 MED ORDER — BISACODYL 5 MG PO TBEC: 5.0000 mg | DELAYED_RELEASE_TABLET | Freq: Every day | ORAL | 0 refills | Status: DC | PRN

## 2022-07-03 MED ORDER — METHOCARBAMOL 500 MG PO TABS
500.0000 mg | ORAL_TABLET | Freq: Four times a day (QID) | ORAL | Status: DC | PRN
  Administered 2022-07-06 – 2022-07-15 (×7): 500 mg via ORAL
  Filled 2022-07-03 (×9): qty 1

## 2022-07-03 MED ORDER — POLYETHYLENE GLYCOL 3350 17 G PO PACK: 17.0000 g | PACK | Freq: Every day | ORAL | Status: DC | PRN

## 2022-07-03 MED ORDER — PANTOPRAZOLE SODIUM 20 MG PO TBEC
20.0000 mg | DELAYED_RELEASE_TABLET | Freq: Every day | ORAL | Status: DC
  Administered 2022-07-04 – 2022-07-21 (×18): 20 mg via ORAL
  Filled 2022-07-03 (×19): qty 1

## 2022-07-03 MED ORDER — PROCHLORPERAZINE MALEATE 5 MG PO TABS
5.0000 mg | ORAL_TABLET | Freq: Four times a day (QID) | ORAL | Status: DC | PRN
  Administered 2022-07-06 – 2022-07-08 (×2): 5 mg via ORAL
  Administered 2022-07-09 – 2022-07-16 (×2): 10 mg via ORAL
  Filled 2022-07-03 (×2): qty 1
  Filled 2022-07-03 (×2): qty 2

## 2022-07-03 MED ORDER — ACETAMINOPHEN 325 MG PO TABS: 325.0000 mg | ORAL_TABLET | ORAL | Status: DC | PRN

## 2022-07-03 MED ORDER — POLYETHYLENE GLYCOL 3350 17 G PO PACK
17.0000 g | PACK | Freq: Every day | ORAL | 0 refills | Status: AC | PRN
Start: 2022-07-03 — End: ?

## 2022-07-03 MED ORDER — CITALOPRAM HYDROBROMIDE 20 MG PO TABS
20.0000 mg | ORAL_TABLET | Freq: Every day | ORAL | Status: DC
  Administered 2022-07-04 – 2022-07-21 (×18): 20 mg via ORAL
  Filled 2022-07-03 (×18): qty 1

## 2022-07-03 MED ORDER — VITAMIN D (ERGOCALCIFEROL) 1.25 MG (50000 UNIT) PO CAPS
50000.0000 [IU] | ORAL_CAPSULE | ORAL | Status: DC
  Administered 2022-07-10 – 2022-07-17 (×2): 50000 [IU] via ORAL
  Filled 2022-07-03 (×2): qty 1

## 2022-07-03 MED ORDER — TRAZODONE HCL 50 MG PO TABS
25.0000 mg | ORAL_TABLET | Freq: Every evening | ORAL | Status: DC | PRN
  Administered 2022-07-09 – 2022-07-12 (×4): 50 mg via ORAL
  Filled 2022-07-03 (×4): qty 1

## 2022-07-03 NOTE — Progress Notes (Signed)
Report called to CIR 

## 2022-07-03 NOTE — Progress Notes (Signed)
Inpatient Rehab Admissions Coordinator:    Inpatient Rehab Coordinator Note:  I met with patient at bedside to discuss CIR recommendations and goals/expectations of CIR stay.  We reviewed 3 hrs/day of therapy, physician follow up, and average length of stay 2 weeks (dependent upon progress) with goals of supervision/mod I.  Patient would like to pursue potential CIR admission. We have a bed available for patient to be admitted later today or tomorrow. I will continue to follow and admit patient when medically appropriate.     Rehab Admissons Coordinator [place], [place], [place] [phone]

## 2022-07-03 NOTE — Evaluation (Signed)
Occupational Therapy Evaluation Patient Details Name: Amber Ruiz MRN: [id number] DOB: 05/07/1941 Today's Date: 07/03/2022   History of Present Illness Pt is an 81 y.o. female who presented 07/01/22 s/p fall and sustained a comminuted, displaced intertrochanteric R hip fx. S/p cephalomedullary nailing of R intertrochanteric femur fx 8/13. PMH: auricles, HTN, OCD, anxiety/depression, anemia   Clinical Impression   Pt in bed upon therapy arrival and agreeable to participate in OT evaluation. Pt premedicated prior to OT's arrival. Due to hip pain and guarding of the operative side, pt required increased time for all bed mobility and transitions in order to sit on EOB and then complete a step pivot transfer using RW towards right side. Pt provided with VC for sequencing including weight shifting technique in order to unload RLE in order to advance foot towards desired direction. At this time, pt is demonstrating increased pain, decreased activity tolerance, endurance which is causing increased physical assistance and greater than typical time to complete BADL and functional transfers. Recommend discharge to CIR prior to returning home. OT will follow patient acutely to focus on mentioned deficits.     Recommendations for follow up therapy are one component of a multi-disciplinary discharge planning process, led by the attending physician.  Recommendations may be updated based on patient status, additional functional criteria and insurance authorization.   Follow Up Recommendations  Acute inpatient rehab (3hours/day)    Assistance Recommended at Discharge Intermittent Supervision/Assistance  Patient can return home with the following A lot of help with walking and/or transfers;A lot of help with bathing/dressing/bathroom;Assistance with cooking/housework;Help with stairs or ramp for entrance;Assist for transportation;Direct supervision/assist for financial management;Direct supervision/assist for  medications management    Functional Status Assessment  Patient has had a recent decline in their functional status and demonstrates the ability to make significant improvements in function in a reasonable and predictable amount of time.  Equipment Recommendations  BSC/3in1;Tub/shower seat    Recommendations for Other Services Rehab consult     Precautions / Restrictions Precautions Precautions: Fall;Other (comment) Precaution Comments: watch BP and SpO2; vertigo Restrictions Weight Bearing Restrictions: Yes RLE Weight Bearing: Weight bearing as tolerated      Mobility Bed Mobility Overal bed mobility: Needs Assistance Bed Mobility: Supine to Sit     Supine to sit: Max assist, HOB elevated     General bed mobility comments: required assistance to manage movement of RLE towards EOB. Max A to flex hips to bring trunk forward to sit on EOB.    Transfers Overall transfer level: Needs assistance Equipment used: Rolling walker (2 wheels) Transfers: Sit to/from Stand, Bed to chair/wheelchair/BSC Sit to Stand: Mod assist     Step pivot transfers: Mod assist            Balance Overall balance assessment: Needs assistance Sitting-balance support: Bilateral upper extremity supported, Feet supported Sitting balance-Leahy Scale: Poor Sitting balance - Comments: minA for static sitting balance, posterior bias Postural control: Left lateral lean (to guard right hip) Standing balance support: Bilateral upper extremity supported, During functional activity, Reliant on assistive device for balance Standing balance-Leahy Scale: Poor       ADL either performed or assessed with clinical judgement   ADL Overall ADL's : Needs assistance/impaired Eating/Feeding: Modified independent;Bed level   Grooming: Wash/dry hands;Wash/dry face;Oral care;Set up;Sitting   Upper Body Bathing: Sitting;Minimal assistance   Lower Body Bathing: Total assistance;Bed level   Upper Body  Dressing : Minimal assistance;Sitting   Lower Body Dressing: Total assistance;Bed level   Toilet Transfer:  Moderate assistance;Rolling walker (2 wheels);Cueing for sequencing;Cueing for safety Toilet Transfer Details (indicate cue type and reason): step pivot; towards right Toileting- Clothing Manipulation and Hygiene: Total assistance;Sit to/from stand         General ADL Comments: Slow and labored movements performed during bed mobility and functional transfers. Pt with reports of dizziness once seated on EOB requiring increased time to calm down prior to standing. Therapist provided education on sequencing RW with coordinating steps when transferring from bed to recliner.     Vision Baseline Vision/History: 1 Wears glasses Ability to See in Adequate Light: 0 Adequate Patient Visual Report: No change from baseline Vision Assessment?: No apparent visual deficits            Pertinent Vitals/Pain Pain Assessment Pain Assessment: Faces Faces Pain Scale: Hurts even more Pain Location: right hip during bed mobility and out of bed mobility/weightbearing Pain Descriptors / Indicators: Discomfort, Grimacing, Operative site guarding Pain Intervention(s): Limited activity within patient's tolerance, Premedicated before session, Repositioned, Monitored during session     Hand Dominance Right   Extremity/Trunk Assessment Upper Extremity Assessment Upper Extremity Assessment: Generalized weakness   Lower Extremity Assessment Lower Extremity Assessment: Defer to PT evaluation   Cervical / Trunk Assessment Cervical / Trunk Assessment: Kyphotic   Communication Communication Communication: No difficulties   Cognition Arousal/Alertness: Awake/alert Behavior During Therapy: WFL for tasks assessed/performed Overall Cognitive Status: Within Functional Limits for tasks assessed                    Home Living Family/patient expects to be discharged to:: Private residence Living  Arrangements: Alone Available Help at Discharge: Family;Friend(s);Available PRN/intermittently Type of Home: House Home Access: Stairs to enter [employer] of Steps: 4-6 (4 in garage without rails; 6 in front with R&L) Entrance Stairs-Rails: Right;Left (in front; not in garage) Home Layout: Two level;1/2 bath on main level;Bed/bath upstairs (able to sleep in recliner) Alternate Level Stairs-Number of Steps: 12-13 Alternate Level Stairs-Rails: Right (going up) Bathroom Shower/Tub: Walk-in shower;Tub/shower unit (uses walk-in primarily. door and curtain)   Bathroom Toilet: Standard Bathroom Accessibility: Yes   Home Equipment: Rollator (4 wheels);Other (comment);Grab bars - tub/shower;Grab bars - toilet   Additional Comments: bed rails attached to bed      Prior Functioning/Environment Prior Level of Function : Independent/Modified Independent             Mobility Comments: does not use AD          OT Problem List: Decreased strength;Pain;Increased edema;Decreased safety awareness;Decreased activity tolerance;Impaired balance (sitting and/or standing);Decreased knowledge of use of DME or AE      OT Treatment/Interventions: Self-care/ADL training;Therapeutic activities;Therapeutic exercise;Neuromuscular education;Energy conservation;DME and/or AE instruction;Patient/family education;Balance training;Manual therapy;Modalities    OT Goals(Current goals can be found in the care plan section) Acute Rehab OT Goals Patient Stated Goal: to get stronger OT Goal Formulation: With patient Time For Goal Achievement: 07/17/22 Potential to Achieve Goals: Good  OT Frequency: Min 2X/week       AM-PAC OT "6 Clicks" Daily Activity     Outcome Measure Help from another person eating meals?: None Help from another person taking care of personal grooming?: A Little Help from another person toileting, which includes using toliet, bedpan, or urinal?: Total Help from another  person bathing (including washing, rinsing, drying)?: A Lot Help from another person to put on and taking off regular upper body clothing?: A Little Help from another person to put on and taking off regular lower body clothing?:  Total 6 Click Score: 14   End of Session Equipment Utilized During Treatment: Rolling walker (2 wheels);Gait belt Nurse Communication: Mobility status  Activity Tolerance: Patient tolerated treatment well Patient left: in chair;with call bell/phone within reach;with chair alarm set  OT Visit Diagnosis: Muscle weakness (generalized) (M62.81);History of falling (Z91.81)                Time: 6503-5465 OT Time Calculation (min): 53 min Charges:  OT General Charges $OT Visit: 1 Visit OT Evaluation $OT Re-eval: 1 Re-eval Limmie Patricia, OTR/L,CBIS  Supplemental OT - [facility] and [facility]   Anayeli Arel, Charisse March 07/03/2022, 12:59 PM

## 2022-07-03 NOTE — Progress Notes (Signed)
Orthopaedic Trauma Progress Note  SUBJECTIVE: Doing okay today.  Currently about to get up and work with physical therapy.  Notes pain in right hip is present but well controlled with current medication.  No chest pain. No SOB. No nausea/vomiting. No other complaints.   OBJECTIVE:  Vitals:   07/03/22 0504 07/03/22 0819  BP: (!) 110/42 (!) 126/53  Pulse: 78 78  Resp: 16 17  Temp: 98.4 F (36.9 C) 98.2 F (36.8 C)  SpO2: 96% 98%    General: Sitting up in bedside chair, no acute distress Respiratory: No increased work of breathing.  Right lower extremity: Dressings clean, dry, intact.  Able to get full knee extension.  Flexion to at least 90 degrees.  Endorses sensation on extremity.  Neurovascularly intact.  IMAGING: Stable post op imaging.   LABS:  Results for orders placed or performed during the hospital encounter of 07/01/22 (from the past 24 hour(s))  Basic metabolic panel     Status: Abnormal   Collection Time: 07/03/22  2:15 AM  Result Value Ref Range   Sodium 136 135 - 145 mmol/L   Potassium 4.5 3.5 - 5.1 mmol/L   Chloride 104 98 - 111 mmol/L   CO2 26 22 - 32 mmol/L   Glucose, Bld 144 (H) 70 - 99 mg/dL   BUN 14 8 - 23 mg/dL   Creatinine, Ser 1.01 0.44 - 1.00 mg/dL   Calcium 7.9 (L) 8.9 - 10.3 mg/dL   GFR, Estimated >75 >10 mL/min   Anion gap 6 5 - 15  CBC     Status: Abnormal   Collection Time: 07/03/22  2:15 AM  Result Value Ref Range   WBC 12.9 (H) 4.0 - 10.5 K/uL   RBC 3.23 (L) 3.87 - 5.11 MIL/uL   Hemoglobin 8.8 (L) 12.0 - 15.0 g/dL   HCT 25.8 (L) 52.7 - 78.2 %   MCV 87.0 80.0 - 100.0 fL   MCH 27.2 26.0 - 34.0 pg   MCHC 31.3 30.0 - 36.0 g/dL   RDW 42.3 53.6 - 14.4 %   Platelets 204 150 - 400 K/uL   nRBC 0.0 0.0 - 0.2 %    ASSESSMENT: Amber Ruiz is a 81 y.o. female, 1 Day Post-Op s/p INTRAMEDULLARY  NAIL RIGHT INTERTROCHANTERIC FEMUR FRACTURE   CV/Blood loss: Acute blood loss anemia, Hgb 8.8 this morning. Hemodynamically  stable  PLAN: Weightbearing: WBAT RLE ROM: Okay for unrestricted hip and knee motion as tolerated Incisional and dressing care: Reinforce dressings as needed.  Plan to remove/change dressing tomorrow 07/04/2022 Showering: Okay to begin showering/getting incisions wet 07/05/2022 if no drainage from incisions Orthopedic device(s): None  Pain management:  1. Tylenol 1000 mg q 6 hours PRN 2. Robaxin 500 mg q 6 hours PRN 3. Norco 5-325 mg q 4 hours PRN 4. Dilaudid 0.5 mg q 2 hours PRN VTE prophylaxis: Lovenox, SCDs ID:  Ancef 2gm post op completed Foley/Lines:  No foley, KVO IVFs Impediments to Fracture Healing: Vitamin D level 19, start on D supplementation. Dispo: PT/OT evaluation today, currently recommending CIR.  CIR appears to have a bed available today, plan to discharge later this afternoon per primary team D/C recommendations: -Norco for pain control -Eliquis 2.5 mg twice daily x30 days for DVT prophylaxis -Continue Vit D supplementation x8 weeks  Follow - up plan: 2 weeks after discharge for wound check and repeat x-rays   Contact information:  Truitt Merle MD, Thyra Breed PA-C. After hours and holidays please check [email or web address] for group  call information for [facility]   Thompson Caul, PA-C [phone] (office) [email or web address]

## 2022-07-03 NOTE — Progress Notes (Signed)
Initial Nutrition Assessment  DOCUMENTATION CODES:  Not applicable  INTERVENTION:  Liberalize diet to regular to promote PO intake Add Ergocalciferol 50,000 IU weekly x 8 weeks for deficiency. Adjust to 1000 IU of cholecalciferol daily for maintenance.  Ensure Enlive po BID, each supplement provides 350 kcal and 20 grams of protein.  NUTRITION DIAGNOSIS:  Increased nutrient needs related to post-op healing as evidenced by estimated needs.  GOAL:  Patient will meet greater than or equal to 90% of their needs  MONITOR:  PO intake, Supplement acceptance, Labs  REASON FOR ASSESSMENT:  Consult Hip fracture protocol  ASSESSMENT:  Pt with hx of HTN presented to ED after a fall at home. Found to have a right femur fracture and was taken for surgical repair 8/13   8/13 - Op, Cephalomedullary nailing of right intertrochanteric femur fracture  Pt working with OT at first attempted visit and with PT at second attempt. Will defer nutrition interview and physcial exam to follow-up.   Average Meal Intake: 8/14: 100% intake x 1 recorded meals  Nutritionally Relevant Medications: Scheduled Meds:  docusate sodium  100 mg Oral BID   ferrous sulfate  325 mg Oral Daily   pantoprazole  20 mg Oral Daily   Labs Reviewed: Vitamin D - 19.74  NUTRITION - FOCUSED PHYSICAL EXAM: Defer to in-person follow-up  Diet Order:   Diet Order             Diet regular Room service appropriate? Yes; Fluid consistency: Thin  Diet effective now           Diet general                   EDUCATION NEEDS:  No education needs have been identified at this time  Skin:  Skin Assessment: Reviewed RN Assessment (surgical incision right hip)  Last BM:  unsure  Height:  Ht Readings from Last 1 Encounters:  07/02/22 5' (1.524 m)    Weight:  Wt Readings from Last 1 Encounters:  07/02/22 61.2 kg    Ideal Body Weight:  45.5 kg  BMI:  Body mass index is 26.37 kg/m.  Estimated Nutritional Needs:   Kcal:  1500-1700 kcal/d Protein:  75-90 g/d Fluid:  >/=1.5L/d    Greig Castilla, RD, LDN Clinical Dietitian RD pager # available in [facility]  After hours/weekend pager # available in [facility]

## 2022-07-03 NOTE — H&P (Signed)
Physical Medicine and Rehabilitation Admission H&P         Chief Complaint  Patient presents with   Functional deficits secondary to       Fall and hip fracture      HPI:  Amber Ruiz is an 81 year old female with history of OCD, anxiety, anemia, vertigo in the past; who was admitted after reports of fall onto her right hip the night PTA, she stayed on the floor from 10 pm to 11 am till family found her. She was admitted next day on 07/01/22 for work up. She was found to have right IT femur fracture and underwent ORIF on 08/13 by Dr. Jena Gauss. Post op WBAT and on Lovenox for DVT prophylaxis. Follow up labs show ABLA which is being monitored. Leucocytosis with rise in WBC to 12.9 being monitored.  PT evaluations done revealing right nystagmus which was treated with modified L Dix Hallpike maneuvers.  OT evaluation completed today revealing pain, decrease in activity tolerance as well as difficulty advancing RLE. CIR recommended due to functional declein.      Review of Systems  Constitutional:  Negative for chills and fever.  HENT:  Negative for hearing loss.   Respiratory:  Negative for cough and shortness of breath.   Cardiovascular:  Negative for chest pain and palpitations.  Gastrointestinal:  Positive for constipation. Negative for heartburn, nausea and vomiting.  Genitourinary:  Negative for dysuria and urgency.  Musculoskeletal:  Positive for joint pain.  Skin:  Negative for itching and rash.  Neurological:  Positive for dizziness and weakness.            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 Was teacher, apartment manager--retired at age 56. Independent without AD. Children live in [place]. She reports that she has never smoked. She has never used smokeless tobacco. She reports that she does not drink alcohol. No history on file for drug use.     Allergies: No Known Allergies           Medications Prior to Admission  Medication Sig Dispense Refill   acetaminophen (TYLENOL) 500 MG tablet Take 1,000 mg by mouth every 6 (six) hours as needed for mild pain.       alendronate (FOSAMAX) 70 MG tablet Take 70 mg by mouth once a week.       cetirizine (ZYRTEC) 5 MG tablet Take 5 mg by mouth daily.       citalopram (CELEXA) 40 MG tablet Take 20 mg by mouth daily.        FEROSUL 325 (65 Fe) MG tablet Take 325 mg by mouth daily.       lisinopril (ZESTRIL) 10 MG tablet Take 10 mg by mouth daily.  pantoprazole (PROTONIX) 20 MG tablet Take 20 mg by mouth daily.              Home: Home Living Family/patient expects to be discharged to:: Private residence Living Arrangements: Alone Available Help at Discharge: Family, Friend(s) Type of Home: House Home Access: Stairs to enter [employer] of Steps: 4-6 (4 in garage without rails; 6 in front with R&L) Entrance Stairs-Rails: Right, Left, Can reach both Home Layout: Two level, 1/2 bath on main level Alternate Level Stairs-Number of Steps: 12-13 Alternate Level Stairs-Rails: Right Bathroom Shower/Tub: Psychologist, counselling, Engineer, manufacturing systems: Standard Bathroom Accessibility: Yes Home Equipment: Agricultural consultant (2 wheels) Additional Comments: bed rails attached to bed   Functional History: Prior Function Prior Level of Function : Independent/Modified Independent Mobility Comments: does not use AD   Functional Status:   Mobility: Bed Mobility Overal bed mobility: Needs Assistance Bed Mobility: Supine to Sit Supine to sit: Max assist, HOB elevated General bed mobility comments: required assistance to manage movement of RLE towards EOB. Max A to flex hips to bring trunk forward to sit on EOB. Transfers Overall transfer level: Needs assistance Equipment used: Rolling walker (2 wheels) Transfers: Sit to/from Stand, Bed to chair/wheelchair/BSC Sit to Stand: Min assist Bed to/from chair/wheelchair/BSC transfer type:: Step pivot Step pivot transfers: Mod assist General transfer comment: from recliner with use of bil armrests Ambulation/Gait Ambulation/Gait assistance: Mod assist Gait Distance (Feet): 5 Feet Assistive device: Rolling walker (2 wheels) Gait Pattern/deviations: Step-to pattern, Decreased step length - right, Decreased stance time - right, Decreased weight shift to right, Decreased stride length, Antalgic General Gait Details: ModA to cue weight shifting, advance R leg, Pt having difficulty accepting weight on R leg and moving R leg to step, needing assistance. Chair brought to pt after 4 feet forward, 1 foot backward. Gait velocity: reduced Gait velocity interpretation: <1.31 ft/sec, indicative of household ambulator   ADL: ADL Overall ADL's : Needs assistance/impaired Eating/Feeding: Modified independent, Bed level Grooming: Wash/dry hands, Wash/dry face, Oral care, Set up, Sitting Upper Body Bathing: Sitting, Minimal assistance Lower Body Bathing: Total assistance, Bed level Upper Body Dressing : Minimal assistance, Sitting Lower Body Dressing: Total assistance, Bed level Toilet Transfer: Moderate assistance, Rolling walker (2 wheels), Cueing for sequencing, Cueing for safety Toilet Transfer Details (indicate cue type and reason): step pivot; towards right Toileting- Clothing Manipulation and Hygiene: Total assistance, Sit to/from stand General ADL Comments: Slow and labored movements  performed during bed mobility and functional transfers. Pt with reports of dizziness once seated on EOB requiring increased time to calm down prior to standing. Therapist provided education on sequencing RW with coordinating steps when transferring from bed to recliner.   Cognition: Cognition Overall Cognitive Status: Within Functional Limits for tasks assessed Orientation Level: Oriented X4 Cognition Arousal/Alertness: Awake/alert Behavior During Therapy: WFL for tasks assessed/performed Overall Cognitive Status: Within Functional Limits for tasks assessed General Comments: Pt just received dilaudid, and pt aware she was drifitng off during conversations at times. Pt also repetitive with questionable memory and needs cues for sequencing mobility, but unsure if related to meds or baseline.     Blood pressure (!) 126/53, pulse 78, temperature 98.2 F (36.8 C), temperature source Oral, resp. rate 17, height 5' (1.524 m), weight 61.2 kg, SpO2 98 %. Physical Exam Vitals and nursing note reviewed.  Constitutional:      Appearance: Normal appearance.  HENT:     Head: Normocephalic.     Right Ear: External ear normal.  Left Ear: External ear normal.     Nose: Nose normal.  Eyes:     Extraocular Movements: Extraocular movements intact.     Pupils: Pupils are equal, round, and reactive to light.  Cardiovascular:     Rate and Rhythm: Normal rate and regular rhythm.     Heart sounds: No murmur heard.    No gallop.  Pulmonary:     Effort: Pulmonary effort is normal.  Abdominal:     General: Bowel sounds are normal. There is no distension.     Palpations: Abdomen is soft.     Tenderness: There is no abdominal tenderness.  Musculoskeletal:        General: Swelling (Right hip) and tenderness present.     Cervical back: Normal range of motion.  Skin:    General: Skin is warm.     Comments: Right hip incision  CDI with foam dressing.   Neurological:     Mental Status: She is alert and  oriented to person, place, and time.     Cranial Nerves: No cranial nerve deficit.     Sensory: No sensory deficit.     Coordination: Coordination normal.     Comments: Right hip limited by pain. Otherwise motor 5/5 UE, 3-4+/5 LLE, Right ADF/APF 4/5. No sensory findings. DTR's 1+  Psychiatric:        Mood and Affect: Mood normal.        Behavior: Behavior normal.        Lab Results Last 48 Hours        Results for orders placed or performed during the hospital encounter of 07/01/22 (from the past 48 hour(s))  ABO/Rh     Status: None    Collection Time: 07/02/22 12:42 AM  Result Value Ref Range    ABO/RH(D)          A POS Performed at [facility], [address] [place], [place] [address]    CBC     Status: Abnormal    Collection Time: 07/02/22 12:45 AM  Result Value Ref Range    WBC 8.8 4.0 - 10.5 K/uL    RBC 3.76 (L) 3.87 - 5.11 MIL/uL    Hemoglobin 10.0 (L) 12.0 - 15.0 g/dL    HCT 92.1 (L) 19.4 - 46.0 %    MCV 86.4 80.0 - 100.0 fL    MCH 26.6 26.0 - 34.0 pg    MCHC 30.8 30.0 - 36.0 g/dL    RDW 17.4 08.1 - 44.8 %    Platelets 226 150 - 400 K/uL    nRBC 0.0 0.0 - 0.2 %      Comment: Performed at [facility], [address] [place], [place] [address]  Surgical pcr screen     Status: None    Collection Time: 07/02/22  7:06 AM    Specimen: Nasal Mucosa; Nasal Swab  Result Value Ref Range    MRSA, PCR NEGATIVE NEGATIVE    Staphylococcus aureus NEGATIVE NEGATIVE      Comment: (NOTE) The Xpert SA Assay (FDA approved for NASAL specimens in patients 49 years of age and older), is one component of a comprehensive surveillance program. It is not intended to diagnose infection nor to guide or monitor treatment. Performed at [facility], [address] [place], [place] [address]    VITAMIN D 25 Hydroxy (Vit-D Deficiency, Fractures)     Status: Abnormal    Collection Time: 07/02/22 11:23 AM  Result Value  Ref Range    Vit D, 25-Hydroxy 19.74 (L) 30 - 100  ng/mL      Comment: (NOTE) Vitamin D deficiency has been defined by the Institute of Medicine  and an Endocrine Society practice guideline as a level of serum 25-OH  vitamin D less than 20 ng/mL (1,2). The Endocrine Society went on to  further define vitamin D insufficiency as a level between 21 and 29  ng/mL (2).   1. IOM (Institute of Medicine). 2010. Dietary reference intakes for  calcium and D. [place]: [employer]. 2. Holick MF, Binkley Bastrop, Bischoff-Ferrari HA, et al. Evaluation,  treatment, and prevention of vitamin D deficiency: an Endocrine  Society clinical practice guideline, JCEM. 2011 Jul; 96(7): 1911-30.   Performed at [facility], [address] [place], [place] [address]    Creatinine, serum     Status: None    Collection Time: 07/02/22 11:23 AM  Result Value Ref Range    Creatinine, Ser 0.90 0.44 - 1.00 mg/dL    GFR, Estimated >53 >66 mL/min      Comment: (NOTE) Calculated using the CKD-EPI Creatinine Equation (2021) Performed at [facility], [address] [place], [place] [address]    Basic metabolic panel     Status: Abnormal    Collection Time: 07/03/22  2:15 AM  Result Value Ref Range    Sodium 136 135 - 145 mmol/L    Potassium 4.5 3.5 - 5.1 mmol/L    Chloride 104 98 - 111 mmol/L    CO2 26 22 - 32 mmol/L    Glucose, Bld 144 (H) 70 - 99 mg/dL      Comment: Glucose reference range applies only to samples taken after fasting for at least 8 hours.    BUN 14 8 - 23 mg/dL    Creatinine, Ser 7.42 0.44 - 1.00 mg/dL    Calcium 7.9 (L) 8.9 - 10.3 mg/dL    GFR, Estimated >59 >56 mL/min      Comment: (NOTE) Calculated using the CKD-EPI Creatinine Equation (2021)      Anion gap 6 5 - 15      Comment: Performed at [facility], [address] [place], [place] [address]  CBC     Status: Abnormal    Collection Time: 07/03/22  2:15 AM  Result Value Ref Range    WBC 12.9 (H) 4.0 - 10.5 K/uL    RBC 3.23 (L) 3.87 - 5.11  MIL/uL    Hemoglobin 8.8 (L) 12.0 - 15.0 g/dL    HCT 43.3 (L) 29.5 - 46.0 %    MCV 87.0 80.0 - 100.0 fL    MCH 27.2 26.0 - 34.0 pg    MCHC 31.3 30.0 - 36.0 g/dL    RDW 18.8 41.6 - 60.6 %    Platelets 204 150 - 400 K/uL    nRBC 0.0 0.0 - 0.2 %      Comment: Performed at [facility], [address], [place], [place] [address]       Imaging Results (Last 48 hours)  ECHOCARDIOGRAM COMPLETE   Result Date: 07/02/2022    ECHOCARDIOGRAM REPORT   Patient Name:   [facility] Date of Exam: 07/02/2022 Medical Rec #:  [id number]       Height:       60.0 in Accession #:    [id number]      Weight:       135.0 lb Date  of Birth:  22-Mar-1941        BSA:          1.579 m Patient Age:    81 years        BP:           108/65 mmHg Patient Gender: F               HR:           75 bpm. Exam Location:  Inpatient Procedure: 2D Echo Indications:    syncope  History:        Patient has no prior history of Echocardiogram examinations.  Sonographer:    Delcie Roch RDCS Referring Phys: [id number] Emeline General IMPRESSIONS  1. Left ventricular ejection fraction, by estimation, is 60 to 65%. The left ventricle has normal function. The left ventricle has no regional wall motion abnormalities. Left ventricular diastolic parameters are consistent with Grade I diastolic dysfunction (impaired relaxation).  2. Right ventricular systolic function is normal. The right ventricular size is normal. There is normal pulmonary artery systolic pressure.  3. The mitral valve is normal in structure. No evidence of mitral valve regurgitation. No evidence of mitral stenosis.  4. Tricuspid valve regurgitation is moderate, most prominent in Img series 45. Unable to visualize well in over view; unable to view hepatin vein or IVC.  5. The aortic valve is tricuspid. Aortic valve regurgitation is not visualized. No aortic stenosis is present. Comparison(s): No prior Echocardiogram. FINDINGS  Left Ventricle: Left ventricular ejection fraction, by  estimation, is 60 to 65%. The left ventricle has normal function. The left ventricle has no regional wall motion abnormalities. The left ventricular internal cavity size was normal in size. There is  no left ventricular hypertrophy. Left ventricular diastolic parameters are consistent with Grade I diastolic dysfunction (impaired relaxation). Right Ventricle: The right ventricular size is normal. No increase in right ventricular wall thickness. Right ventricular systolic function is normal. There is normal pulmonary artery systolic pressure. The tricuspid regurgitant velocity is 2.74 m/s, and  with an assumed right atrial pressure of 3 mmHg, the estimated right ventricular systolic pressure is 33.0 mmHg. Left Atrium: Left atrial size was normal in size. Right Atrium: Right atrial size was normal in size. Pericardium: There is no evidence of pericardial effusion. Mitral Valve: The mitral valve is normal in structure. No evidence of mitral valve regurgitation. No evidence of mitral valve stenosis. Tricuspid Valve: The tricuspid valve is normal in structure. Tricuspid valve regurgitation is moderate . No evidence of tricuspid stenosis. Aortic Valve: The aortic valve is tricuspid. Aortic valve regurgitation is not visualized. No aortic stenosis is present. Pulmonic Valve: The pulmonic valve was normal in structure. Pulmonic valve regurgitation is trivial. No evidence of pulmonic stenosis. Aorta: The aortic root, ascending aorta and aortic arch are all structurally normal, with no evidence of dilitation or obstruction. IAS/Shunts: No atrial level shunt detected by color flow Doppler.  LEFT VENTRICLE PLAX 2D LVIDd:         3.80 cm   Diastology LVIDs:         2.20 cm   LV e' medial:    5.98 cm/s LV PW:         0.80 cm   LV E/e' medial:  19.1 LV IVS:        0.70 cm   LV e' lateral:   5.55 cm/s LVOT diam:     1.60 cm   LV E/e' lateral: 20.5 LV SV:  45 LV SV Index:   28 LVOT Area:     2.01 cm  RIGHT VENTRICLE RV S  prime:     16.30 cm/s TAPSE (M-mode): 2.2 cm LEFT ATRIUM             Index        RIGHT ATRIUM           Index LA diam:        2.60 cm 1.65 cm/m   RA Area:     10.60 cm LA Vol (A2C):   28.3 ml 17.92 ml/m  RA Volume:   22.10 ml  13.99 ml/m LA Vol (A4C):   42.1 ml 26.65 ml/m LA Biplane Vol: 35.9 ml 22.73 ml/m  AORTIC VALVE LVOT Vmax:   107.00 cm/s LVOT Vmean:  73.700 cm/s LVOT VTI:    0.223 m  AORTA Ao Root diam: 2.80 cm Ao Asc diam:  2.60 cm MITRAL VALVE                TRICUSPID VALVE MV Area (PHT): 4.06 cm     TR Peak grad:   30.0 mmHg MV Decel Time: 187 msec     TR Vmax:        274.00 cm/s MV E velocity: 114.00 cm/s MV A velocity: 150.00 cm/s  SHUNTS MV E/A ratio:  0.76         Systemic VTI:  0.22 m                             Systemic Diam: 1.60 cm Riley LamMahesh Chandrasekhar MD Electronically signed by Riley LamMahesh Chandrasekhar MD Signature Date/Time: 07/02/2022/2:58:01 PM    Final     DG FEMUR, MIN 2 VIEWS RIGHT   Result Date: 07/02/2022 CLINICAL DATA:  [id number]; [id number]; [id number]; ORIF right femoral neck EXAM: Three views, 6 images of the right femur and right hip were obtained intraoperatively without presence of radiologist. Fluoro time: 53 seconds.  Fluoro dose: 6.28 mGy COMPARISON:  July 01, 2022 FINDINGS: There are ORIF changes seen with intramedullary rod and compression screw devices transfixing the right intertrochanteric fracture of the femur. The alignment is near anatomical. Please see intraoperative findings for further detail. IMPRESSION: ORIF changes with intramedullary rod and compressive screw devices transfix the intertrochanteric fracture of the right femur. The alignment is near anatomical. Electronically Signed   By: Marjo BickerAmar  Amaresh M.D.   On: 07/02/2022 10:10    DG HIP PORT UNILAT W OR W/O PELVIS 1V RIGHT   Result Date: 07/02/2022 CLINICAL DATA:  [id number]; [id number]; ORIF right femoral neck EXAM: Three views, 6 images of the right femur and right hip were obtained intraoperatively without presence of  radiologist. Fluoro time: 53 seconds.  Fluoro dose: 6.28 mGy COMPARISON:  July 01, 2022 FINDINGS: There are ORIF changes seen with intramedullary rod and compression screw devices transfixing the right intertrochanteric fracture of the femur. The alignment is near anatomical. Please see intraoperative findings for further detail. IMPRESSION: ORIF changes with intramedullary rod and compressive screw devices transfix the intertrochanteric fracture of the right femur. The alignment is near anatomical. Electronically Signed   By: Marjo BickerAmar  Amaresh M.D.   On: 07/02/2022 10:10    CT Knee Right Wo Contrast   Result Date: 07/01/2022 CLINICAL DATA:  Possible undisplaced fracture in the distal femur on recent plain film examination EXAM: CT OF THE RIGHT KNEE WITHOUT CONTRAST TECHNIQUE: Multidetector CT imaging of the right knee was performed according to the  standard protocol. Multiplanar CT image reconstructions were also generated. RADIATION DOSE REDUCTION: This exam was performed according to the departmental dose-optimization program which includes automated exposure control, adjustment of the mA and/or kV according to patient size and/or use of iterative reconstruction technique. COMPARISON:  Plain film from earlier in the same day. FINDINGS: Bones/Joint/Cartilage Distal femur shows no evidence of acute fracture. The lucencies are felt to be related to variation in the mineralization laterally. The proximal tibia and fibula are well visualized without evidence of acute fracture. Subchondral sclerosis is noted in the medial tibial plateau consistent with mild degenerative change. Some osteophytic changes are noted as well. Patella appears within normal limits. Ligaments Suboptimally assessed by CT. Muscles and Tendons Surrounding musculature appears within normal limits. Soft tissues No significant soft tissue edema is noted. No sizable joint effusion is seen. IMPRESSION: No evidence of acute fracture. Lucency seen  previously are related to the degree of mineralization in the distal femur laterally. Mild degenerative changes of the medial knee joint are seen. Electronically Signed   By: Alcide Clever M.D.   On: 07/01/2022 19:26           Blood pressure (!) 126/53, pulse 78, temperature 98.2 F (36.8 C), temperature source Oral, resp. rate 17, height 5' (1.524 m), weight 61.2 kg, SpO2 98 %.   Medical Problem List and Plan: 1. Functional deficits secondary to a right intertrochanteric hip fx s/p ORIF 07/01/22             -patient may shower             -ELOS/Goals: 10-14 day, mod I with PT and OT 2.  Antithrombotics: -DVT/anticoagulation:  Pharmaceutical: Lovenox             -antiplatelet therapy: N/a 3. Pain Management: Hydrocodone prn.  4. Mood/Behavior/Sleep: LCSW to follow for evaluation and support.              --has been sleeping well.              -antipsychotic agents: N/A 5. Neuropsych/cognition: This patient is capable of making decisions on her own behalf. 6. Skin/Wound Care: Routine pressure relief measures.  7. Fluids/Electrolytes/Nutrition: Monitor I/O. Check Lytes in am.  8. Anxiety d/o: Managed with celexa. 9. ABLA: Recheck CBC in am.  10. Leucocytosis: wound clean, chest clear. Likely reactive             -recheck cbc in am 11.  Constipation: Will add senna to colace.     Jacquelynn Cree, PA-C 07/03/2022   I have personally performed a face to face diagnostic evaluation of this patient and formulated the key components of the plan.  Additionally, I have personally reviewed laboratory data, imaging studies, as well as relevant notes and concur with the physician assistant's documentation above.  The patient's status has not changed from the original H&P.  Any changes in documentation from the acute care chart have been noted above.  Ranelle Oyster, MD, [place] [facility]

## 2022-07-03 NOTE — Discharge Summary (Signed)
Physician Discharge Summary  Amber Ruiz UJW:[id number] DOB: Jul 01, 1941 DOA: 07/01/2022  PCP: Amber Moron, MD  Admit date: 07/01/2022 Discharge date: 07/03/2022  Admitted From: home Discharge disposition: CIR   Recommendations for Outpatient Follow-Up:   Eliquis 2.5 mg BID starting 8/14 for a total of 30 days Follow CBC closely-- expected post op blood loss PRN monitoring of Qtc   Discharge Diagnosis:   Principal Problem:   Hip fracture (HCC) Active Problems:   Closed right hip fracture [facility] [place])    Discharge Condition: Improved.  Diet recommendation:  Regular.  Wound care: None.  Code status: Full.   History of Present Illness:  From  Dr. Jacob Ruiz is a 81 y.o. female with medical history significant of auricles, HTN, OCD, anxiety/depression, presented with fall.   Patient reported that "not feeling well for 2 days". But when I asked her to specify her problem, she could not tell me more. Last night, after watching TV show around 2 AM, patient was standing up, but while she was trying to reach trash can, she fell backward hit her backside of the head and right hip and knee.  No LOC.  Could not get up because excruciating pain she stayed on the floor through the night.  Denies any new onset of numbness weakness of any of the limbs.  She could not tell me whether any of his symptoms resemble her past flareup of vertigos, but she reported that she has not had any vertigo episode at least in couple years.   ED Course: Medical stable, no tachycardia no hypotension afebrile.  Trauma scan showed right comminuted inter-trochanter fracture.   Ruiz Course by Problem:   Right hip fracture -s/p Cephalomedullary nailing of right intertrochanteric femur fracture PT/OT eval- CIR? -vit d low- replace -see aboev re: eliquis   Acute blood loss anemia -Secondary to hip fracture - transfuse for symptoms or <7   Hx of Vertigo -As per patient, her  symptoms not compatible with her past flareup of vertigo.   HTN -resume lisinopril as able   ? Qtc prolongation  -tele showed normal Qtc -d/c tele -monitor PRN    Medical Consultants:   ortho   Discharge Exam:   Vitals:   07/03/22 0504 07/03/22 0819  BP: (!) 110/42 (!) 126/53  Pulse: 78 78  Resp: 16 17  Temp: 98.4 F (36.9 C) 98.2 F (36.8 C)  SpO2: 96% 98%   Vitals:   07/02/22 1834 07/02/22 2007 07/03/22 0504 07/03/22 0819  BP: (!) 110/50 103/64 (!) 110/42 (!) 126/53  Pulse: 75 79 78 78  Resp: 16 16 16 17   Temp: 98.2 F (36.8 C) 98.5 F (36.9 C) 98.4 F (36.9 C) 98.2 F (36.8 C)  TempSrc: Oral Oral Oral Oral  SpO2: 96% 93% 96% 98%  Weight:      Height:        General exam: Appears calm and comfortable.   The results of significant diagnostics from this hospitalization (including imaging, microbiology, ancillary and laboratory) are listed below for reference.     Procedures and Diagnostic Studies:   ECHOCARDIOGRAM COMPLETE  Result Date: 07/02/2022    ECHOCARDIOGRAM REPORT   Patient Name:   Amber Ruiz Date of Exam: 07/02/2022 Medical Rec #:  07/04/2022       Height:       60.0 in Accession #:    [id number]      Weight:       135.0 lb Date of Birth:  27-May-1941        BSA:          1.579 m Patient Age:    81 years        BP:           108/65 mmHg Patient Gender: F               HR:           75 bpm. Exam Location:  Inpatient Procedure: 2D Echo Indications:    syncope  History:        Patient has no prior history of Echocardiogram examinations.  Sonographer:    Delcie Roch RDCS Referring Phys: [id number] Emeline General IMPRESSIONS  1. Left ventricular ejection fraction, by estimation, is 60 to 65%. The left ventricle has normal function. The left ventricle has no regional wall motion abnormalities. Left ventricular diastolic parameters are consistent with Grade I diastolic dysfunction (impaired relaxation).  2. Right ventricular systolic function is normal. The  right ventricular size is normal. There is normal pulmonary artery systolic pressure.  3. The mitral valve is normal in structure. No evidence of mitral valve regurgitation. No evidence of mitral stenosis.  4. Tricuspid valve regurgitation is moderate, most prominent in Img series 45. Unable to visualize well in over view; unable to view hepatin vein or IVC.  5. The aortic valve is tricuspid. Aortic valve regurgitation is not visualized. No aortic stenosis is present. Comparison(s): No prior Echocardiogram. FINDINGS  Left Ventricle: Left ventricular ejection fraction, by estimation, is 60 to 65%. The left ventricle has normal function. The left ventricle has no regional wall motion abnormalities. The left ventricular internal cavity size was normal in size. There is  no left ventricular hypertrophy. Left ventricular diastolic parameters are consistent with Grade I diastolic dysfunction (impaired relaxation). Right Ventricle: The right ventricular size is normal. No increase in right ventricular wall thickness. Right ventricular systolic function is normal. There is normal pulmonary artery systolic pressure. The tricuspid regurgitant velocity is 2.74 m/s, and  with an assumed right atrial pressure of 3 mmHg, the estimated right ventricular systolic pressure is 33.0 mmHg. Left Atrium: Left atrial size was normal in size. Right Atrium: Right atrial size was normal in size. Pericardium: There is no evidence of pericardial effusion. Mitral Valve: The mitral valve is normal in structure. No evidence of mitral valve regurgitation. No evidence of mitral valve stenosis. Tricuspid Valve: The tricuspid valve is normal in structure. Tricuspid valve regurgitation is moderate . No evidence of tricuspid stenosis. Aortic Valve: The aortic valve is tricuspid. Aortic valve regurgitation is not visualized. No aortic stenosis is present. Pulmonic Valve: The pulmonic valve was normal in structure. Pulmonic valve regurgitation is  trivial. No evidence of pulmonic stenosis. Aorta: The aortic root, ascending aorta and aortic arch are all structurally normal, with no evidence of dilitation or obstruction. IAS/Shunts: No atrial level shunt detected by color flow Doppler.  LEFT VENTRICLE PLAX 2D LVIDd:         3.80 cm   Diastology LVIDs:         2.20 cm   LV e' medial:    5.98 cm/s LV PW:         0.80 cm   LV E/e' medial:  19.1 LV IVS:        0.70 cm   LV e' lateral:   5.55 cm/s LVOT diam:     1.60 cm   LV E/e' lateral: 20.5 LV SV:  45 LV SV Index:   28 LVOT Area:     2.01 cm  RIGHT VENTRICLE RV S prime:     16.30 cm/s TAPSE (M-mode): 2.2 cm LEFT ATRIUM             Index        RIGHT ATRIUM           Index LA diam:        2.60 cm 1.65 cm/m   RA Area:     10.60 cm LA Vol (A2C):   28.3 ml 17.92 ml/m  RA Volume:   22.10 ml  13.99 ml/m LA Vol (A4C):   42.1 ml 26.65 ml/m LA Biplane Vol: 35.9 ml 22.73 ml/m  AORTIC VALVE LVOT Vmax:   107.00 cm/s LVOT Vmean:  73.700 cm/s LVOT VTI:    0.223 m  AORTA Ao Root diam: 2.80 cm Ao Asc diam:  2.60 cm MITRAL VALVE                TRICUSPID VALVE MV Area (PHT): 4.06 cm     TR Peak grad:   30.0 mmHg MV Decel Time: 187 msec     TR Vmax:        274.00 cm/s MV E velocity: 114.00 cm/s MV A velocity: 150.00 cm/s  SHUNTS MV E/A ratio:  0.76         Systemic VTI:  0.22 m                             Systemic Diam: 1.60 cm Riley Lam MD Electronically signed by Riley Lam MD Signature Date/Time: 07/02/2022/2:58:01 PM    Final    DG FEMUR, MIN 2 VIEWS RIGHT  Result Date: 07/02/2022 CLINICAL DATA:  [id number]; [id number]; ORIF right femoral neck EXAM: Three views, 6 images of the right femur and right hip were obtained intraoperatively without presence of radiologist. Fluoro time: 53 seconds.  Fluoro dose: 6.28 mGy COMPARISON:  July 01, 2022 FINDINGS: There are ORIF changes seen with intramedullary rod and compression screw devices transfixing the right intertrochanteric fracture of the femur. The  alignment is near anatomical. Please see intraoperative findings for further detail. IMPRESSION: ORIF changes with intramedullary rod and compressive screw devices transfix the intertrochanteric fracture of the right femur. The alignment is near anatomical. Electronically Signed   By: Marjo Bicker M.D.   On: 07/02/2022 10:10   DG HIP PORT UNILAT W OR W/O PELVIS 1V RIGHT  Result Date: 07/02/2022 CLINICAL DATA:  [id number]; [id number]; ORIF right femoral neck EXAM: Three views, 6 images of the right femur and right hip were obtained intraoperatively without presence of radiologist. Fluoro time: 53 seconds.  Fluoro dose: 6.28 mGy COMPARISON:  July 01, 2022 FINDINGS: There are ORIF changes seen with intramedullary rod and compression screw devices transfixing the right intertrochanteric fracture of the femur. The alignment is near anatomical. Please see intraoperative findings for further detail. IMPRESSION: ORIF changes with intramedullary rod and compressive screw devices transfix the intertrochanteric fracture of the right femur. The alignment is near anatomical. Electronically Signed   By: Marjo Bicker M.D.   On: 07/02/2022 10:10   CT Knee Right Wo Contrast  Result Date: 07/01/2022 CLINICAL DATA:  Possible undisplaced fracture in the distal femur on recent plain film examination EXAM: CT OF THE RIGHT KNEE WITHOUT CONTRAST TECHNIQUE: Multidetector CT imaging of the right knee was performed according to the standard protocol. Multiplanar CT image reconstructions were  also generated. RADIATION DOSE REDUCTION: This exam was performed according to the departmental dose-optimization program which includes automated exposure control, adjustment of the mA and/or kV according to patient size and/or use of iterative reconstruction technique. COMPARISON:  Plain film from earlier in the same day. FINDINGS: Bones/Joint/Cartilage Distal femur shows no evidence of acute fracture. The lucencies are felt to be related to  variation in the mineralization laterally. The proximal tibia and fibula are well visualized without evidence of acute fracture. Subchondral sclerosis is noted in the medial tibial plateau consistent with mild degenerative change. Some osteophytic changes are noted as well. Patella appears within normal limits. Ligaments Suboptimally assessed by CT. Muscles and Tendons Surrounding musculature appears within normal limits. Soft tissues No significant soft tissue edema is noted. No sizable joint effusion is seen. IMPRESSION: No evidence of acute fracture. Lucency seen previously are related to the degree of mineralization in the distal femur laterally. Mild degenerative changes of the medial knee joint are seen. Electronically Signed   By: Alcide Clever M.D.   On: 07/01/2022 19:26   CT HEAD WO CONTRAST  Result Date: 07/01/2022 CLINICAL DATA:  81 year old female with headache and neck pain following fall. EXAM: CT HEAD WITHOUT CONTRAST CT CERVICAL SPINE WITHOUT CONTRAST TECHNIQUE: Multidetector CT imaging of the head and cervical spine was performed following the standard protocol without intravenous contrast. Multiplanar CT image reconstructions of the cervical spine were also generated. RADIATION DOSE REDUCTION: This exam was performed according to the departmental dose-optimization program which includes automated exposure control, adjustment of the mA and/or kV according to patient size and/or use of iterative reconstruction technique. COMPARISON:  05/07/2022 CTA head, 07/24/2019 brain MR, 03/24/2012 CT cervical spine and prior studies FINDINGS: CT HEAD FINDINGS Brain: No evidence of acute infarction, hemorrhage, hydrocephalus, extra-axial collection or mass lesion/mass effect. Atrophy and chronic small-vessel white matter ischemic changes again noted. Vascular: Carotid and vertebral atherosclerotic calcifications are noted. Skull: Normal. Negative for fracture or focal lesion. Sinuses/Orbits: No acute finding.  Other: RIGHT posterior scalp soft tissue swelling is noted. CT CERVICAL SPINE FINDINGS Alignment: Normal. Skull base and vertebrae: No acute fracture. 30% compression of C7 is unchanged from 2013. No primary bone lesion or focal pathologic process. Soft tissues and spinal canal: No prevertebral fluid or swelling. No visible canal hematoma. 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 Upper chest: No acute abnormality. Other: None IMPRESSION: 1. No evidence of acute intracranial abnormality. Atrophy and chronic small-vessel white matter ischemic changes. 2. RIGHT posterior scalp soft tissue swelling without fracture. 3. No static evidence of acute injury to the cervical spine. Electronically Signed   By: Harmon Pier M.D.   On: 07/01/2022 16:19   CT CERVICAL SPINE WO CONTRAST  Result Date: 07/01/2022 CLINICAL DATA:  81 year old female with headache and neck pain following fall. EXAM: CT HEAD WITHOUT CONTRAST CT CERVICAL SPINE WITHOUT CONTRAST TECHNIQUE: Multidetector CT imaging of the head and cervical spine was performed following the standard protocol without intravenous contrast. Multiplanar CT image reconstructions of the cervical spine were also generated. RADIATION DOSE REDUCTION: This exam was performed according to the departmental dose-optimization program which includes automated exposure control, adjustment of the mA and/or kV according to patient size and/or use of iterative reconstruction technique. COMPARISON:  05/07/2022 CTA head, 07/24/2019 brain MR, 03/24/2012 CT cervical spine and prior studies FINDINGS: CT HEAD FINDINGS Brain: No evidence of acute infarction, hemorrhage, hydrocephalus, extra-axial collection or mass lesion/mass effect. Atrophy and chronic small-vessel white matter ischemic changes again  noted. Vascular: Carotid and vertebral atherosclerotic calcifications are noted. Skull: Normal. Negative for fracture or focal lesion. Sinuses/Orbits: No acute  finding. Other: RIGHT posterior scalp soft tissue swelling is noted. CT CERVICAL SPINE FINDINGS Alignment: Normal. Skull base and vertebrae: No acute fracture. 30% compression of C7 is unchanged from 2013. No primary bone lesion or focal pathologic process. Soft tissues and spinal canal: No prevertebral fluid or swelling. No visible canal hematoma. 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 Upper chest: No acute abnormality. Other: None IMPRESSION: 1. No evidence of acute intracranial abnormality. Atrophy and chronic small-vessel white matter ischemic changes. 2. RIGHT posterior scalp soft tissue swelling without fracture. 3. No static evidence of acute injury to the cervical spine. Electronically Signed   By: Harmon Pier M.D.   On: 07/01/2022 16:19   DG Knee 2 Views Right  Result Date: 07/01/2022 CLINICAL DATA:  Trauma, fall EXAM: RIGHT KNEE - 1-2 VIEW COMPARISON:  None Available. FINDINGS: No displaced fracture is seen. There is no significant effusion in suprapatellar bursa. In the AP view, there is faint vertically oriented lucency in the lateral aspect of distal femur which could not be distinctly seen in the lateral view. IMPRESSION: No displaced fracture is seen in right knee. There is no significant effusion. There are faint linear lucencies in the lateral aspect of distal femur seen only in the AP projection. This may be an artifact or suggest undisplaced fracture. Please correlate with clinical physical examination findings and consider four view examination of right knee if clinically warranted. Electronically Signed   By: Ernie Avena M.D.   On: 07/01/2022 15:45   DG Hip Unilat With Pelvis 2-3 Views Right  Result Date: 07/01/2022 CLINICAL DATA:  Trauma, fall EXAM: DG HIP (WITH OR WITHOUT PELVIS) 2-3V RIGHT COMPARISON:  None Available. FINDINGS: Comminuted intertrochanteric fracture is seen in proximal right femur. There is overriding of fracture fragments.  There is medial displacement of lesser trochanter. There is no dislocation. IMPRESSION: Comminuted, displaced intertrochanteric fracture is seen in proximal right femur. Electronically Signed   By: Ernie Avena M.D.   On: 07/01/2022 15:41   DG FEMUR, MIN 2 VIEWS RIGHT  Result Date: 07/01/2022 CLINICAL DATA:  Trauma, fall EXAM: RIGHT FEMUR 2 VIEWS COMPARISON:  None Available. FINDINGS: Comminuted intertrochanteric fracture is seen proximal right femur. There is superior displacement of distal major fracture fragment. There is no dislocation. IMPRESSION: Comminuted, displaced intertrochanteric fracture is seen in proximal right femur. Electronically Signed   By: Ernie Avena M.D.   On: 07/01/2022 15:40   DG Pelvis 1-2 Views  Result Date: 07/01/2022 CLINICAL DATA:  Pain after fall EXAM: PELVIS - 1-2 VIEW COMPARISON:  None Available. FINDINGS: There is a comminuted angulated/displaced right intertrochanteric femoral fracture. IMPRESSION: Comminuted angulated/displaced right intertrochanteric hip fracture. Electronically Signed   By: Gerome Sam III M.D.   On: 07/01/2022 14:10     Labs:   Basic Metabolic Panel: Recent Labs  Lab 07/01/22 1539 07/02/22 1123 07/03/22 0215  NA 139  --  136  K 4.3  --  4.5  CL 108  --  104  CO2 25  --  26  GLUCOSE 137*  --  144*  BUN 9  --  14  CREATININE 0.68 0.90 0.89  CALCIUM 7.9*  --  7.9*   GFR Estimated Creatinine Clearance: 40.5 mL/min (by C-G formula based on SCr of 0.89 mg/dL). Liver Function Tests: No results for input(s): "AST", "ALT", "ALKPHOS", "BILITOT", "PROT", "ALBUMIN" in  the last 168 hours. No results for input(s): "LIPASE", "AMYLASE" in the last 168 hours. No results for input(s): "AMMONIA" in the last 168 hours. Coagulation profile Recent Labs  Lab 07/01/22 1539  INR 1.1    CBC: Recent Labs  Lab 07/01/22 1539 07/02/22 0045 07/03/22 0215  WBC 10.9* 8.8 12.9*  NEUTROABS 9.0*  --   --   HGB 10.7* 10.0* 8.8*   HCT 34.4* 32.5* 28.1*  MCV 86.6 86.4 87.0  PLT 209 226 204   Cardiac Enzymes: Recent Labs  Lab 07/01/22 1539  CKTOTAL 205   BNP: Invalid input(s): "POCBNP" CBG: No results for input(s): "GLUCAP" in the last 168 hours. D-Dimer No results for input(s): "DDIMER" in the last 72 hours. Hgb A1c No results for input(s): "HGBA1C" in the last 72 hours. Lipid Profile No results for input(s): "CHOL", "HDL", "LDLCALC", "TRIG", "CHOLHDL", "LDLDIRECT" in the last 72 hours. Thyroid function studies No results for input(s): "TSH", "T4TOTAL", "T3FREE", "THYROIDAB" in the last 72 hours.  Invalid input(s): "FREET3" Anemia work up No results for input(s): "VITAMINB12", "FOLATE", "FERRITIN", "TIBC", "IRON", "RETICCTPCT" in the last 72 hours. Microbiology Recent Results (from the past 240 hour(s))  Surgical pcr screen     Status: None   Collection Time: 07/02/22  7:06 AM   Specimen: Nasal Mucosa; Nasal Swab  Result Value Ref Range Status   MRSA, PCR NEGATIVE NEGATIVE Final   Staphylococcus aureus NEGATIVE NEGATIVE Final    Comment: (NOTE) The Xpert SA Assay (FDA approved for NASAL specimens in patients 64 years of age and older), is one component of a comprehensive surveillance program. It is not intended to diagnose infection nor to guide or monitor treatment. Performed at [facility], [address], [place], [place] [address]      Discharge Instructions:   Discharge Instructions     Diet general   Complete by: As directed    Discharge instructions   Complete by: As directed    Eliquis 2.5 mg BID starting 8/15 for a total of 30 days   Discharge wound care:   Complete by: As directed    Reinforce dressing   Increase activity slowly   Complete by: As directed       Allergies as of 07/03/2022   No Known Allergies      Medication List     STOP taking these medications    lisinopril 10 MG tablet Commonly known as: ZESTRIL       TAKE these medications     acetaminophen 500 MG tablet Commonly known as: TYLENOL Take 1,000 mg by mouth every 6 (six) hours as needed for mild pain.   alendronate 70 MG tablet Commonly known as: FOSAMAX Take 70 mg by mouth once a week.   bisacodyl 5 MG EC tablet Commonly known as: DULCOLAX Take 1 tablet (5 mg total) by mouth daily as needed for moderate constipation.   cetirizine 5 MG tablet Commonly known as: ZYRTEC Take 5 mg by mouth daily.   citalopram 40 MG tablet Commonly known as: CELEXA Take 20 mg by mouth daily.   FeroSul 325 (65 FE) MG tablet Generic drug: ferrous sulfate Take 325 mg by mouth daily.   HYDROcodone-acetaminophen 5-325 MG tablet Commonly known as: NORCO/VICODIN Take 1-2 tablets by mouth every 4 (four) hours as needed for moderate pain.   pantoprazole 20 MG tablet Commonly known as: PROTONIX Take 20 mg by mouth daily.   polyethylene glycol 17 g packet Commonly known as: MIRALAX / GLYCOLAX  Take 17 g by mouth daily as needed for mild constipation.   Vitamin D (Ergocalciferol) 1.25 MG (50000 UNIT) Caps capsule Commonly known as: DRISDOL Take 1 capsule (50,000 Units total) by mouth every 7 (seven) days. Start taking on: July 10, 2022               Discharge Care Instructions  (From admission, onward)           Start     Ordered   07/03/22 0000  Discharge wound care:       Comments: Reinforce dressing   07/03/22 1418              Time coordinating discharge: 45 min  Signed:  Joseph ArtJessica U Ferdinando Lodge DO  [facility] 07/03/2022, 2:19 PM

## 2022-07-03 NOTE — Progress Notes (Signed)
Nsg Discharge Note  Admit Date:  07/03/2022 Discharge date: 07/03/2022   Amber Ruiz to be D/C'd Rehab per MD order.  AVS completed.   Removed IV-CDI. Wheeled stable patient and belongings to the rehab unit into room 5MW11. Patient/caregiver able to verbalize understanding.  Discharge Medication:   Discharge Assessment: Vitals:   07/03/22 1751  BP: (!) 133/54  Pulse: 72  Resp: 17  Temp: 98.1 F (36.7 C)  SpO2: 100%   Skin clean, dry and intact without evidence of skin break down, no evidence of skin tears noted. IV catheter discontinued intact. Site without signs and symptoms of complications - no redness or edema noted at insertion site, patient denies c/o pain - only slight tenderness at site.  Dressing with slight pressure applied.  D/c Instructions-Education: Discharge instructions given to patient/family with verbalized understanding. D/c education completed with patient/family including follow up instructions, medication list, d/c activities limitations if indicated, with other d/c instructions as indicated by MD - patient able to verbalize understanding, all questions fully answered. Patient instructed to return to ED, call 911, or call MD for any changes in condition.  Patient escorted via WC, and D/C home via private auto.  Karolee Ohs, RN 07/03/2022 6:13 PM

## 2022-07-03 NOTE — Progress Notes (Signed)
Physical Therapy Treatment Patient Details Name: Amber Ruiz MRN: [id number] DOB: 07-26-1941 Today's Date: 07/03/2022   History of Present Illness Pt is an 81 y.o. female who presented 07/01/22 s/p fall and sustained a comminuted, displaced intertrochanteric R hip fx. S/p cephalomedullary nailing of R intertrochanteric femur fx 8/13. PMH: auricles, HTN, OCD, anxiety/depression, anemia    PT Comments    Patient up in chair on arrival. 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 Reports whenever she gets OOB, she feels a small amount of vertigo and today was no different than usual. (Pt was OOB and up to chair with OT earlier today. Pt did not want to return to bed and therefore BPPV assessment not completed this date). Tolerated limited exercises with incr pain and required incr time. Progressed to standing with min assist and ambulated 5 ft with mod assist. Recommend chair follow next visit.    Recommendations for follow up therapy are one component of a multi-disciplinary discharge planning process, led by the attending physician.  Recommendations may be updated based on patient status, additional functional criteria and insurance authorization.  Follow Up Recommendations  Acute inpatient rehab (3hours/day)     Assistance Recommended at Discharge Frequent or constant Supervision/Assistance  Patient can return home with the following A lot of help with walking and/or transfers;A lot of help with bathing/dressing/bathroom;Assistance with cooking/housework;Assist for transportation;Help with stairs or ramp for entrance   Equipment Recommendations  Rolling walker (2 wheels);BSC/3in1    Recommendations for Other Services Rehab consult     Precautions / Restrictions Precautions Precautions: Fall;Other (comment) Precaution Comments: watch BP and SpO2; vertigo Restrictions Weight Bearing Restrictions: Yes RLE Weight Bearing: Weight bearing as tolerated      Mobility  Bed Mobility                    Transfers Overall transfer level: Needs assistance Equipment used: Rolling walker (2 wheels) Transfers: Sit to/from Stand, Bed to chair/wheelchair/BSC Sit to Stand: Min assist           General transfer comment: from recliner with use of bil armrests    Ambulation/Gait Ambulation/Gait assistance: Mod assist Gait Distance (Feet): 5 Feet Assistive device: Rolling walker (2 wheels) Gait Pattern/deviations: Step-to pattern, Decreased step length - right, Decreased stance time - right, Decreased weight shift to right, Decreased stride length, Antalgic Gait velocity: reduced     General Gait Details: ModA to cue weight shifting, advance R leg, Pt having difficulty accepting weight on R leg and moving R leg to step, needing assistance. Chair brought to pt after 4 feet forward, 1 foot backward.   Stairs             Wheelchair Mobility    Modified Rankin (Stroke Patients Only)       Balance Overall balance assessment: Needs assistance Sitting-balance support: Bilateral upper extremity supported, Feet supported Sitting balance-Leahy Scale: Poor Sitting balance - Comments: minA for static sitting balance Postural control: Left lateral lean (to guard right hip) Standing balance support: Bilateral upper extremity supported, During functional activity, Reliant on assistive device for balance Standing balance-Leahy Scale: Poor Standing balance comment: reliant on RW and external physical assistance                            Cognition Arousal/Alertness: Awake/alert Behavior During Therapy: WFL for tasks assessed/performed Overall Cognitive Status: Within Functional Limits for tasks assessed  Exercises General Exercises - Lower Extremity Ankle Circles/Pumps: AROM, 10 reps Quad Sets: AROM, Right, 5 reps Other Exercises Other Exercises: knee flexion  in sitting x 3 minutes as preparing for sit to stand    General Comments        Pertinent Vitals/Pain Pain Assessment Pain Assessment: Faces Faces Pain Scale: Hurts even more Pain Location: right hip whebn out of bed mobility/weightbearing Pain Descriptors / Indicators: Discomfort, Grimacing, Operative site guarding Pain Intervention(s): Limited activity within patient's tolerance, Monitored during session    Home Living   Living Arrangements: Alone Available Help at Discharge: Family;Friend(s) Type of Home: House Home Access: Stairs to enter Entrance Stairs-Rails: Right;Left;Can reach both Entrance Stairs-Number of Steps: 4-6 (4 in garage without rails; 6 in front with R&L) Alternate Level Stairs-Number of Steps: 12-13 Home Layout: Two level;1/2 bath on main level Home Equipment: Agricultural consultant (2 wheels)      Prior Function            PT Goals (current goals can now be found in the care plan section) Acute Rehab PT Goals Patient Stated Goal: to get better Time For Goal Achievement: 07/16/22 Potential to Achieve Goals: Good Progress towards PT goals: Progressing toward goals    Frequency    Min 5X/week      PT Plan Current plan remains appropriate    Co-evaluation              AM-PAC PT "6 Clicks" Mobility   Outcome Measure  Help needed turning from your back to your side while in a flat bed without using bedrails?: A Little Help needed moving from lying on your back to sitting on the side of a flat bed without using bedrails?: A Lot Help needed moving to and from a bed to a chair (including a wheelchair)?: A Lot Help needed standing up from a chair using your arms (e.g., wheelchair or bedside chair)?: A Little Help needed to walk in hospital room?: Total Help needed climbing 3-5 steps with a railing? : Total 6 Click Score: 12    End of Session Equipment Utilized During Treatment: Gait belt Activity Tolerance: Patient limited by pain Patient  left: in chair;with call bell/phone within reach;with chair alarm set   PT Visit Diagnosis: Unsteadiness on feet (R26.81);Other abnormalities of gait and mobility (R26.89);Muscle weakness (generalized) (M62.81);History of falling (Z91.81);Difficulty in walking, not elsewhere classified (R26.2);Dizziness and giddiness (R42);BPPV;Pain BPPV - Right/Left : Right (posterior canal) Pain - Right/Left: Right Pain - part of body: Hip     Time: 7782-4235 PT Time Calculation (min) (ACUTE ONLY): 42 min  Charges:  $Gait Training: 8-22 mins (only billed for 2 units due to multiple interruptions during session) $Therapeutic Exercise: 8-22 mins                      Jerolyn Center, PT Acute Rehabilitation Services  Office [phone]    Zena Amos 07/03/2022, 2:54 PM

## 2022-07-03 NOTE — Progress Notes (Signed)
PROGRESS NOTE    Amber Ruiz  PJK:[id number] DOB: 1940/12/22 DOA: 07/01/2022 PCP: Forrest Moron, MD    Brief Narrative:   Amber Ruiz is a 81 y.o. female with medical history significant of auricles, HTN, OCD, anxiety/depression, presented with fall.  Trauma scan showed right comminuted inter-trochanter fracture and she is s/p repair.  Assessment and Plan:  Right hip fracture -s/p Cephalomedullary nailing of right intertrochanteric femur fracture PT/OT eval- CIR? -vit d low- replace  Acute blood loss anemia -Secondary to hip fracture -cbc daily, transfuse for symptoms or <7   Hx of Vertigo -As per patient, her symptoms not compatible with her past flareup of vertigo. -PT eval   HTN -resume meds as able   ? Qtc prolongation  -tele showed normal Qtc -d/c tele -monitor PRN    DVT prophylaxis: enoxaparin (LOVENOX) injection 40 mg Start: 07/03/22 1000 SCDs Start: 07/02/22 1107    Code Status: Full Code Family Communication: sone at bedside 8/13  Disposition Plan:  Level of care: Med-Surg Status is: Inpatient Remains inpatient appropriate because: PT/OT    Consultants:  ortho   Subjective: Overall very sore  Objective: Vitals:   07/02/22 1834 07/02/22 2007 07/03/22 0504 07/03/22 0819  BP: (!) 110/50 103/64 (!) 110/42 (!) 126/53  Pulse: 75 79 78 78  Resp: 16 16 16 17   Temp: 98.2 F (36.8 C) 98.5 F (36.9 C) 98.4 F (36.9 C) 98.2 F (36.8 C)  TempSrc: Oral Oral Oral Oral  SpO2: 96% 93% 96% 98%  Weight:      Height:        Intake/Output Summary (Last 24 hours) at 07/03/2022 1159 Last data filed at 07/03/2022 1000 Gross per 24 hour  Intake 501.54 ml  Output 1100 ml  Net -598.46 ml   Filed Weights   07/02/22 0704  Weight: 61.2 kg    Examination:    General: Appearance:     Overweight female in no acute distress     Lungs:      respirations unlabored  Heart:    Normal heart rate.    MS:   All extremities are intact.    Neurologic:   Awake, alert         Data Reviewed: I have personally reviewed following labs and imaging studies  CBC: Recent Labs  Lab 07/01/22 1539 07/02/22 0045 07/03/22 0215  WBC 10.9* 8.8 12.9*  NEUTROABS 9.0*  --   --   HGB 10.7* 10.0* 8.8*  HCT 34.4* 32.5* 28.1*  MCV 86.6 86.4 87.0  PLT 209 226 204   Basic Metabolic Panel: Recent Labs  Lab 07/01/22 1539 07/02/22 1123 07/03/22 0215  NA 139  --  136  K 4.3  --  4.5  CL 108  --  104  CO2 25  --  26  GLUCOSE 137*  --  144*  BUN 9  --  14  CREATININE 0.68 0.90 0.89  CALCIUM 7.9*  --  7.9*   GFR: Estimated Creatinine Clearance: 40.5 mL/min (by C-G formula based on SCr of 0.89 mg/dL). Liver Function Tests: No results for input(s): "AST", "ALT", "ALKPHOS", "BILITOT", "PROT", "ALBUMIN" in the last 168 hours. No results for input(s): "LIPASE", "AMYLASE" in the last 168 hours. No results for input(s): "AMMONIA" in the last 168 hours. Coagulation Profile: Recent Labs  Lab 07/01/22 1539  INR 1.1   Cardiac Enzymes: Recent Labs  Lab 07/01/22 1539  CKTOTAL 205   BNP (last 3 results) No results for input(s): "PROBNP" in the last  8760 hours. HbA1C: No results for input(s): "HGBA1C" in the last 72 hours. CBG: No results for input(s): "GLUCAP" in the last 168 hours. Lipid Profile: No results for input(s): "CHOL", "HDL", "LDLCALC", "TRIG", "CHOLHDL", "LDLDIRECT" in the last 72 hours. Thyroid Function Tests: No results for input(s): "TSH", "T4TOTAL", "FREET4", "T3FREE", "THYROIDAB" in the last 72 hours. Anemia Panel: No results for input(s): "VITAMINB12", "FOLATE", "FERRITIN", "TIBC", "IRON", "RETICCTPCT" in the last 72 hours. Sepsis Labs: No results for input(s): "PROCALCITON", "LATICACIDVEN" in the last 168 hours.  Recent Results (from the past 240 hour(s))  Surgical pcr screen     Status: None   Collection Time: 07/02/22  7:06 AM   Specimen: Nasal Mucosa; Nasal Swab  Result Value Ref Range Status   MRSA,  PCR NEGATIVE NEGATIVE Final   Staphylococcus aureus NEGATIVE NEGATIVE Final    Comment: (NOTE) The Xpert SA Assay (FDA approved for NASAL specimens in patients 4 years of age and older), is one component of a comprehensive surveillance program. It is not intended to diagnose infection nor to guide or monitor treatment. Performed at [facility], [place] [address] [place], [place] [address]          Radiology Studies: ECHOCARDIOGRAM COMPLETE  Result Date: 07/02/2022    ECHOCARDIOGRAM REPORT   Patient Name:   Amber Ruiz Date of Exam: 07/02/2022 Medical Rec #:  [id number]       Height:       60.0 in Accession #:    [id number]      Weight:       135.0 lb Date of Birth:  February 24, 1941        BSA:          1.579 m Patient Age:    76 years        BP:           108/65 mmHg Patient Gender: F               HR:           75 bpm. Exam Location:  Inpatient Procedure: 2D Echo Indications:    syncope  History:        Patient has no prior history of Echocardiogram examinations.  Sonographer:    [place] Referring Phys: [id number] [place]  1. Left ventricular ejection fraction, by estimation, is 60 to 65%. The left ventricle has normal function. The left ventricle has no regional wall motion abnormalities. Left ventricular diastolic parameters are consistent with Grade I diastolic dysfunction (impaired relaxation).  2. Right ventricular systolic function is normal. The right ventricular size is normal. There is normal pulmonary artery systolic pressure.  3. The mitral valve is normal in structure. No evidence of mitral valve regurgitation. No evidence of mitral stenosis.  4. Tricuspid valve regurgitation is moderate, most prominent in Img series 45. Unable to visualize well in over view; unable to view hepatin vein or IVC.  5. The aortic valve is tricuspid. Aortic valve regurgitation is not visualized. No aortic stenosis is present. Comparison(s): No prior Echocardiogram. FINDINGS   Left Ventricle: Left ventricular ejection fraction, by estimation, is 60 to 65%. The left ventricle has normal function. The left ventricle has no regional wall motion abnormalities. The left ventricular internal cavity size was normal in size. There is  no left ventricular hypertrophy. Left ventricular diastolic parameters are consistent with Grade I diastolic dysfunction (impaired relaxation). Right Ventricle: The right ventricular size is normal. No increase in right ventricular  wall thickness. Right ventricular systolic function is normal. There is normal pulmonary artery systolic pressure. The tricuspid regurgitant velocity is 2.74 m/s, and  with an assumed right atrial pressure of 3 mmHg, the estimated right ventricular systolic pressure is 33.0 mmHg. Left Atrium: Left atrial size was normal in size. Right Atrium: Right atrial size was normal in size. Pericardium: There is no evidence of pericardial effusion. Mitral Valve: The mitral valve is normal in structure. No evidence of mitral valve regurgitation. No evidence of mitral valve stenosis. Tricuspid Valve: The tricuspid valve is normal in structure. Tricuspid valve regurgitation is moderate . No evidence of tricuspid stenosis. Aortic Valve: The aortic valve is tricuspid. Aortic valve regurgitation is not visualized. No aortic stenosis is present. Pulmonic Valve: The pulmonic valve was normal in structure. Pulmonic valve regurgitation is trivial. No evidence of pulmonic stenosis. Aorta: The aortic root, ascending aorta and aortic arch are all structurally normal, with no evidence of dilitation or obstruction. IAS/Shunts: No atrial level shunt detected by color flow Doppler.  LEFT VENTRICLE PLAX 2D LVIDd:         3.80 cm   Diastology LVIDs:         2.20 cm   LV e' medial:    5.98 cm/s LV PW:         0.80 cm   LV E/e' medial:  19.1 LV IVS:        0.70 cm   LV e' lateral:   5.55 cm/s LVOT diam:     1.60 cm   LV E/e' lateral: 20.5 LV SV:         45 LV SV Index:    28 LVOT Area:     2.01 cm  RIGHT VENTRICLE RV S prime:     16.30 cm/s TAPSE (M-mode): 2.2 cm LEFT ATRIUM             Index        RIGHT ATRIUM           Index LA diam:        2.60 cm 1.65 cm/m   RA Area:     10.60 cm LA Vol (A2C):   28.3 ml 17.92 ml/m  RA Volume:   22.10 ml  13.99 ml/m LA Vol (A4C):   42.1 ml 26.65 ml/m LA Biplane Vol: 35.9 ml 22.73 ml/m  AORTIC VALVE LVOT Vmax:   107.00 cm/s LVOT Vmean:  73.700 cm/s LVOT VTI:    0.223 m  AORTA Ao Root diam: 2.80 cm Ao Asc diam:  2.60 cm MITRAL VALVE                TRICUSPID VALVE MV Area (PHT): 4.06 cm     TR Peak grad:   30.0 mmHg MV Decel Time: 187 msec     TR Vmax:        274.00 cm/s MV E velocity: 114.00 cm/s MV A velocity: 150.00 cm/s  SHUNTS MV E/A ratio:  0.76         Systemic VTI:  0.22 m                             Systemic Diam: 1.60 cm Riley Lam MD Electronically signed by Riley Lam MD Signature Date/Time: 07/02/2022/2:58:01 PM    Final    DG FEMUR, MIN 2 VIEWS RIGHT  Result Date: 07/02/2022 CLINICAL DATA:  [id number]; [id number]; ORIF right femoral neck EXAM: Three views, 6 images of  the right femur and right hip were obtained intraoperatively without presence of radiologist. Fluoro time: 53 seconds.  Fluoro dose: 6.28 mGy COMPARISON:  July 01, 2022 FINDINGS: There are ORIF changes seen with intramedullary rod and compression screw devices transfixing the right intertrochanteric fracture of the femur. The alignment is near anatomical. Please see intraoperative findings for further detail. IMPRESSION: ORIF changes with intramedullary rod and compressive screw devices transfix the intertrochanteric fracture of the right femur. The alignment is near anatomical. Electronically Signed   By: Frazier Richards M.D.   On: 07/02/2022 10:10   DG HIP PORT UNILAT W OR W/O PELVIS 1V RIGHT  Result Date: 07/02/2022 CLINICAL DATA:  [id number]; [id number]; ORIF right femoral neck EXAM: Three views, 6 images of the right femur and right hip were  obtained intraoperatively without presence of radiologist. Fluoro time: 53 seconds.  Fluoro dose: 6.28 mGy COMPARISON:  July 01, 2022 FINDINGS: There are ORIF changes seen with intramedullary rod and compression screw devices transfixing the right intertrochanteric fracture of the femur. The alignment is near anatomical. Please see intraoperative findings for further detail. IMPRESSION: ORIF changes with intramedullary rod and compressive screw devices transfix the intertrochanteric fracture of the right femur. The alignment is near anatomical. Electronically Signed   By: Frazier Richards M.D.   On: 07/02/2022 10:10   CT Knee Right Wo Contrast  Result Date: 07/01/2022 CLINICAL DATA:  Possible undisplaced fracture in the distal femur on recent plain film examination EXAM: CT OF THE RIGHT KNEE WITHOUT CONTRAST TECHNIQUE: Multidetector CT imaging of the right knee was performed according to the standard protocol. Multiplanar CT image reconstructions were also generated. RADIATION DOSE REDUCTION: This exam was performed according to the departmental dose-optimization program which includes automated exposure control, adjustment of the mA and/or kV according to patient size and/or use of iterative reconstruction technique. COMPARISON:  Plain film from earlier in the same day. FINDINGS: Bones/Joint/Cartilage Distal femur shows no evidence of acute fracture. The lucencies are felt to be related to variation in the mineralization laterally. The proximal tibia and fibula are well visualized without evidence of acute fracture. Subchondral sclerosis is noted in the medial tibial plateau consistent with mild degenerative change. Some osteophytic changes are noted as well. Patella appears within normal limits. Ligaments Suboptimally assessed by CT. Muscles and Tendons Surrounding musculature appears within normal limits. Soft tissues No significant soft tissue edema is noted. No sizable joint effusion is seen. IMPRESSION: No  evidence of acute fracture. Lucency seen previously are related to the degree of mineralization in the distal femur laterally. Mild degenerative changes of the medial knee joint are seen. Electronically Signed   By: Inez Catalina M.D.   On: 07/01/2022 19:26   CT HEAD WO CONTRAST  Result Date: 07/01/2022 CLINICAL DATA:  81 year old female with headache and neck pain following fall. EXAM: CT HEAD WITHOUT CONTRAST CT CERVICAL SPINE WITHOUT CONTRAST TECHNIQUE: Multidetector CT imaging of the head and cervical spine was performed following the standard protocol without intravenous contrast. Multiplanar CT image reconstructions of the cervical spine were also generated. RADIATION DOSE REDUCTION: This exam was performed according to the departmental dose-optimization program which includes automated exposure control, adjustment of the mA and/or kV according to patient size and/or use of iterative reconstruction technique. COMPARISON:  05/07/2022 CTA head, 07/24/2019 brain MR, 03/24/2012 CT cervical spine and prior studies FINDINGS: CT HEAD FINDINGS Brain: No evidence of acute infarction, hemorrhage, hydrocephalus, extra-axial collection or mass lesion/mass effect. Atrophy and chronic small-vessel white matter ischemic changes  again noted. Vascular: Carotid and vertebral atherosclerotic calcifications are noted. Skull: Normal. Negative for fracture or focal lesion. Sinuses/Orbits: No acute finding. Other: RIGHT posterior scalp soft tissue swelling is noted. CT CERVICAL SPINE FINDINGS Alignment: Normal. Skull base and vertebrae: No acute fracture. 30% compression of C7 is unchanged from 2013. No primary bone lesion or focal pathologic process. Soft tissues and spinal canal: No prevertebral fluid or swelling. No visible canal hematoma. 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 Upper chest: No acute abnormality. Other: None IMPRESSION: 1. No evidence of acute intracranial abnormality. Atrophy  and chronic small-vessel white matter ischemic changes. 2. RIGHT posterior scalp soft tissue swelling without fracture. 3. No static evidence of acute injury to the cervical spine. Electronically Signed   By: Margarette Canada M.D.   On: 07/01/2022 16:19   CT CERVICAL SPINE WO CONTRAST  Result Date: 07/01/2022 CLINICAL DATA:  81 year old female with headache and neck pain following fall. EXAM: CT HEAD WITHOUT CONTRAST CT CERVICAL SPINE WITHOUT CONTRAST TECHNIQUE: Multidetector CT imaging of the head and cervical spine was performed following the standard protocol without intravenous contrast. Multiplanar CT image reconstructions of the cervical spine were also generated. RADIATION DOSE REDUCTION: This exam was performed according to the departmental dose-optimization program which includes automated exposure control, adjustment of the mA and/or kV according to patient size and/or use of iterative reconstruction technique. COMPARISON:  05/07/2022 CTA head, 07/24/2019 brain MR, 03/24/2012 CT cervical spine and prior studies FINDINGS: CT HEAD FINDINGS Brain: No evidence of acute infarction, hemorrhage, hydrocephalus, extra-axial collection or mass lesion/mass effect. Atrophy and chronic small-vessel white matter ischemic changes again noted. Vascular: Carotid and vertebral atherosclerotic calcifications are noted. Skull: Normal. Negative for fracture or focal lesion. Sinuses/Orbits: No acute finding. Other: RIGHT posterior scalp soft tissue swelling is noted. CT CERVICAL SPINE FINDINGS Alignment: Normal. Skull base and vertebrae: No acute fracture. 30% compression of C7 is unchanged from 2013. No primary bone lesion or focal pathologic process. Soft tissues and spinal canal: No prevertebral fluid or swelling. No visible canal hematoma. 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 Upper chest: No acute abnormality. Other: None IMPRESSION: 1. No evidence of acute intracranial abnormality.  Atrophy and chronic small-vessel white matter ischemic changes. 2. RIGHT posterior scalp soft tissue swelling without fracture. 3. No static evidence of acute injury to the cervical spine. Electronically Signed   By: Margarette Canada M.D.   On: 07/01/2022 16:19   DG Knee 2 Views Right  Result Date: 07/01/2022 CLINICAL DATA:  Trauma, fall EXAM: RIGHT KNEE - 1-2 VIEW COMPARISON:  None Available. FINDINGS: No displaced fracture is seen. There is no significant effusion in suprapatellar bursa. In the AP view, there is faint vertically oriented lucency in the lateral aspect of distal femur which could not be distinctly seen in the lateral view. IMPRESSION: No displaced fracture is seen in right knee. There is no significant effusion. There are faint linear lucencies in the lateral aspect of distal femur seen only in the AP projection. This may be an artifact or suggest undisplaced fracture. Please correlate with clinical physical examination findings and consider four view examination of right knee if clinically warranted. Electronically Signed   By: Elmer Picker M.D.   On: 07/01/2022 15:45   DG Hip Unilat With Pelvis 2-3 Views Right  Result Date: 07/01/2022 CLINICAL DATA:  Trauma, fall EXAM: DG HIP (WITH OR WITHOUT PELVIS) 2-3V RIGHT COMPARISON:  None Available. FINDINGS: Comminuted intertrochanteric fracture is seen in proximal  right femur. There is overriding of fracture fragments. There is medial displacement of lesser trochanter. There is no dislocation. IMPRESSION: Comminuted, displaced intertrochanteric fracture is seen in proximal right femur. Electronically Signed   By: Elmer Picker M.D.   On: 07/01/2022 15:41   DG FEMUR, MIN 2 VIEWS RIGHT  Result Date: 07/01/2022 CLINICAL DATA:  Trauma, fall EXAM: RIGHT FEMUR 2 VIEWS COMPARISON:  None Available. FINDINGS: Comminuted intertrochanteric fracture is seen proximal right femur. There is superior displacement of distal major fracture fragment.  There is no dislocation. IMPRESSION: Comminuted, displaced intertrochanteric fracture is seen in proximal right femur. Electronically Signed   By: Elmer Picker M.D.   On: 07/01/2022 15:40   DG Pelvis 1-2 Views  Result Date: 07/01/2022 CLINICAL DATA:  Pain after fall EXAM: PELVIS - 1-2 VIEW COMPARISON:  None Available. FINDINGS: There is a comminuted angulated/displaced right intertrochanteric femoral fracture. IMPRESSION: Comminuted angulated/displaced right intertrochanteric hip fracture. Electronically Signed   By: Dorise Bullion III M.D.   On: 07/01/2022 14:10        Scheduled Meds:  citalopram  20 mg Oral Daily   docusate sodium  100 mg Oral BID   enoxaparin (LOVENOX) injection  40 mg Subcutaneous Q24H   ferrous sulfate  325 mg Oral Daily   loratadine  10 mg Oral Daily   pantoprazole  20 mg Oral Daily   Vitamin D (Ergocalciferol)  50,000 Units Oral Q7 days   Continuous Infusions:     LOS: 2 days    Time spent: 45 minutes spent on chart review, discussion with nursing staff, consultants, updating family and interview/physical exam; more than 50% of that time was spent in counseling and/or coordination of care.    Geradine Girt, DO [facility] Available via Epic secure chat 7am-7pm After these hours, please refer to coverage provider listed on [email or web address] 07/03/2022, 11:59 AM

## 2022-07-03 NOTE — PMR Pre-admission (Signed)
PMR Admission Coordinator Pre-Admission Assessment  Patient: Amber Ruiz is an 81 y.o., female MRN: [id number] DOB: 05/15/1941 Height: 5' (152.4 cm) Weight: 61.2 kg  Insurance Information HMO:     PPO:      PCP:      IPA:      80/20:      OTHER:  PRIMARY: Medicare A & B      Policy#: [id number]      Subscriber: Patient CM Name:       Phone#:      Fax#:  Pre-Cert#: verified through Passport      Employer:  Benefits:  Phone #:      Name:  Eff. Date: 01/18/2006     Deduct: $1600      Out of Pocket Max: 0      Life Max:  CIR: 100%      SNF: 20 full days Outpatient:  80%    Co-Pay: 20% Home Health:  100%     Co-Pay: 0 DME:  80%    Co-Pay: 20% Providers:  SECONDARY: BCBS      Policy#: [id number]     Phone#:   Financial Counselor:       Phone#:   The Engineer, petroleum" for patients in Inpatient Rehabilitation Facilities with attached "Privacy Act Ledbetter Records" was provided and verbally reviewed with: Patient  Emergency Contact Information Contact Information     Name Relation Home Work Mobile   Harly, Pipkins Daughter   [phone]   Aide, Wojnar   [phone]       Current Medical History  Patient Admitting Diagnosis: fall, femur fracture, s/p ORIF History of Present Illness: Patient is an 81 year old female admitted 07/01/22 after falling at home and sustained a comminuted, displaced intertrochanteric R hip fx. S/p cephalomedullary nailing of R intertrochanteric femur fx 8/13. PMH: auricles, HTN, OCD, anxiety/depression, vertigo and anemia. Current Hgb is 8.8 with MD advising daily cbc and transfusion if <7 or patient symptomatic. Therapies recommending intensive physical rehab.     Patient's medical record from [facility] has been reviewed by the rehabilitation admission coordinator and physician.  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 No  Family History   family history is not on file.  Current Medications  Current Facility-Administered Medications:    acetaminophen (TYLENOL) tablet 1,000 mg, 1,000 mg, Oral, Q6H PRN, Corinne Ports, PA-C, 1,000 mg at 07/03/22 0235   bisacodyl (DULCOLAX) EC tablet 5 mg, 5 mg, Oral, Daily PRN, Corinne Ports, PA-C   citalopram (CELEXA) tablet 20 mg, 20 mg, Oral, Daily, McClung, Sarah A, PA-C, 20 mg at 07/03/22 1001   docusate sodium (COLACE) capsule 100 mg, 100 mg, Oral, BID, McClung, Sarah A, PA-C, 100 mg at 07/03/22 1001   enoxaparin (LOVENOX) injection 40 mg, 40 mg, Subcutaneous, Q24H, McClung, Sarah A, PA-C, 40 mg at 07/03/22 1001   ferrous sulfate tablet 325 mg, 325 mg, Oral, Daily, Corinne Ports, PA-C, 325 mg at 07/03/22 1001   HYDROcodone-acetaminophen (NORCO/VICODIN) 5-325 MG per tablet 1-2 tablet, 1-2 tablet, Oral, Q4H PRN, Corinne Ports, PA-C, 2 tablet at 07/03/22 1001   HYDROmorphone (DILAUDID) injection 0.5 mg, 0.5 mg, Intravenous, Q2H PRN, Corinne Ports, PA-C,  0.5 mg at 07/02/22 1446   loratadine (CLARITIN) tablet 10 mg, 10 mg, Oral, Daily, Corinne Ports, PA-C, 10 mg at 07/03/22 1001   metoCLOPramide (REGLAN) tablet 5-10 mg, 5-10 mg, Oral, Q8H PRN **OR** metoCLOPramide (REGLAN) injection 5-10 mg, 5-10 mg, Intravenous, Q8H PRN, McClung, Sarah A, PA-C   ondansetron (ZOFRAN) tablet 4 mg, 4 mg, Oral, Q6H PRN **OR** ondansetron (ZOFRAN) injection 4 mg, 4 mg, Intravenous, Q6H PRN, McClung, Sarah A, PA-C   pantoprazole (PROTONIX) EC tablet 20 mg, 20 mg, Oral, Daily, McClung, Sarah A, PA-C, 20 mg at 07/03/22 1001   polyethylene glycol (MIRALAX / GLYCOLAX) packet 17 g, 17 g, Oral, Daily PRN, McClung, Sarah A, PA-C   senna-docusate (Senokot-S) tablet 1 tablet, 1 tablet, Oral, QHS PRN, McClung, Sarah A, PA-C   Vitamin D (Ergocalciferol)  (DRISDOL) 1.25 MG (50000 UNIT) capsule 50,000 Units, 50,000 Units, Oral, Q7 days, Vann, Jessica U, DO, 50,000 Units at 07/03/22 1234  Patients Current Diet:  Diet Order             Diet regular Room service appropriate? Yes; Fluid consistency: Thin  Diet effective now           Diet general                   Precautions / Restrictions Precautions Precautions: Fall, Other (comment) Precaution Comments: watch BP and SpO2; vertigo Restrictions Weight Bearing Restrictions: Yes RLE Weight Bearing: Weight bearing as tolerated   Has the patient had 2 or more falls or a fall with injury in the past year? Yes  Prior Activity Level Limited Community (1-2x/wk): ambulating without AD  Prior Functional Level Self Care: Did the patient need help bathing, dressing, using the toilet or eating? Independent  Indoor Mobility: Did the patient need assistance with walking from room to room (with or without device)? Independent  Stairs: Did the patient need assistance with internal or external stairs (with or without device)? Independent  Functional Cognition: Did the patient need help planning regular tasks such as shopping or remembering to take medications? Independent  Patient Information Are you of Hispanic, Latino/a,or Spanish origin?: A. No, not of Hispanic, Latino/a, or Spanish origin What is your race?: A. White Do you need or want an interpreter to communicate with a doctor or health care staff?: 0. No  Patient's Response To:  Health Literacy and Transportation Is the patient able to respond to health literacy and transportation needs?: Yes Health Literacy - How often do you need to have someone help you when you read instructions, pamphlets, or other written material from your doctor or pharmacy?: Never In the past 12 months, has lack of transportation kept you from medical appointments or from getting medications?: No In the past 12 months, has lack of transportation kept you  from meetings, work, or from getting things needed for daily living?: No  Home Assistive Devices / [place] Devices/Equipment: Environmental consultant (specify type) Home Equipment: Rolling Walker (2 wheels)  Prior Device Use: Indicate devices/aids used by the patient prior to current illness, exacerbation or injury? None of the above  Current Functional Level Cognition  Overall Cognitive Status: Within Functional Limits for tasks assessed Orientation Level: Oriented X4 General Comments: Pt just received dilaudid, and pt aware she was drifitng off during conversations at times. Pt also repetitive with questionable memory and needs cues for sequencing mobility, but unsure if related to meds or baseline.    Extremity Assessment (includes Sensation/Coordination)  Upper Extremity Assessment: Generalized  weakness  Lower Extremity Assessment: Defer to PT evaluation RLE Deficits / Details: noted edema around hip and thigh; pain impacting AROM and strength and WB tolerance    ADLs  Overall ADL's : Needs assistance/impaired Eating/Feeding: Modified independent, Bed level Grooming: Wash/dry hands, Wash/dry face, Oral care, Set up, Sitting Upper Body Bathing: Sitting, Minimal assistance Lower Body Bathing: Total assistance, Bed level Upper Body Dressing : Minimal assistance, Sitting Lower Body Dressing: Total assistance, Bed level Toilet Transfer: Moderate assistance, Rolling walker (2 wheels), Cueing for sequencing, Cueing for safety Toilet Transfer Details (indicate cue type and reason): step pivot; towards right Toileting- Clothing Manipulation and Hygiene: Total assistance, Sit to/from stand General ADL Comments: Slow and labored movements performed during bed mobility and functional transfers. Pt with reports of dizziness once seated on EOB requiring increased time to calm down prior to standing. Therapist provided education on sequencing RW with coordinating steps when transferring from bed  to recliner.    Mobility  Overal bed mobility: Needs Assistance Bed Mobility: Supine to Sit Supine to sit: Max assist, HOB elevated General bed mobility comments: required assistance to manage movement of RLE towards EOB. Max A to flex hips to bring trunk forward to sit on EOB.    Transfers  Overall transfer level: Needs assistance Equipment used: Rolling walker (2 wheels) Transfers: Sit to/from Stand, Bed to chair/wheelchair/BSC Sit to Stand: Mod assist Bed to/from chair/wheelchair/BSC transfer type:: Step pivot Step pivot transfers: Mod assist General transfer comment: ModA to facilitate anterior weight shift as pt has posterior bias. ModA to cue weight shifting, advance R leg, and turn RW to stand step to R bed > recliner.    Ambulation / Gait / Stairs / Wheelchair Mobility  Ambulation/Gait Ambulation/Gait assistance: Mod assist Gait Distance (Feet): 2 Feet Assistive device: Rolling walker (2 wheels) Gait Pattern/deviations: Step-to pattern, Decreased step length - right, Decreased stance time - right, Decreased weight shift to right, Decreased stride length, Antalgic General Gait Details: ModA to cue weight shifting, advance R leg, and turn RW to stand step to R bed > recliner. Pt having difficulty accepting weight on R leg and moving R leg to step, needing assistance. Gait velocity: reduced Gait velocity interpretation: <1.31 ft/sec, indicative of household ambulator    Posture / Balance Dynamic Sitting Balance Sitting balance - Comments: minA for static sitting balance, posterior bias Balance Overall balance assessment: Needs assistance Sitting-balance support: Bilateral upper extremity supported, Feet supported Sitting balance-Leahy Scale: Poor Sitting balance - Comments: minA for static sitting balance, posterior bias Postural control: Left lateral lean (to guard right hip) Standing balance support: Bilateral upper extremity supported, During functional activity, Reliant on  assistive device for balance Standing balance-Leahy Scale: Poor Standing balance comment: reliant on RW and external physical assistance    Special needs/care consideration Skin surgical wound   Previous Home Environment (from acute therapy documentation) Living Arrangements: Alone Available Help at Discharge: Family, Friend(s) Type of Home: House Home Layout: Two level, 1/2 bath on main level Alternate Level Stairs-Rails: Right Alternate Level Stairs-Number of Steps: 12-13 Home Access: Stairs to enter Entrance Stairs-Rails: Right, Left, Can reach both Entrance Stairs-Number of Steps: 4-6 (4 in garage without rails; 6 in front with R&L) Bathroom Shower/Tub: Gaffer, Chiropodist: Standard Bathroom Accessibility: Yes How Accessible: Accessible via walker Additional Comments: bed rails attached to bed  Discharge Living Setting Plans for Discharge Living Setting: Patient's home Discharge Home Layout: Two level Alternate Level Stairs-Rails: Right Alternate Level Stairs-Number of Steps:  12 Discharge Home Access: Stairs to enter Entrance Stairs-Rails: Right, Left, Can reach both Entrance Stairs-Number of Steps: 6 Discharge Bathroom Shower/Tub: Walk-in shower Discharge Bathroom Toilet: Standard Does the patient have any problems obtaining your medications?: No  Social/Family/Support Systems Anticipated Caregiver: son, Tommy/ Daughter, Mickel Baas Anticipated Ambulance person Information: [phone] Caregiver Availability: Intermittent Discharge Plan Discussed with Primary Caregiver: Yes Is Caregiver In Agreement with Plan?: Yes Does Caregiver/Family have Issues with Lodging/Transportation while Pt is in Rehab?: No  Goals Patient/Family Goal for Rehab: Mod I PT, OT Expected length of stay: 14 days Pt/Family Agrees to Admission and willing to participate: Yes Program Orientation Provided & Reviewed with Pt/Caregiver Including Roles  &  Responsibilities: Yes  Barriers to Discharge: Insurance for SNF coverage  Decrease burden of Care through IP rehab admission: OtherN/A  Possible need for SNF placement upon discharge: Not anticipated  Patient Condition: I have reviewed medical records from [facility], spoken with  [facility] , and patient and daughter. I met with patient at the bedside and discussed via phone for inpatient rehabilitation assessment.  Patient will benefit from ongoing PT and OT, can actively participate in 3 hours of therapy a day 5 days of the week, and can make measurable gains during the admission.  Patient will also benefit from the coordinated team approach during an Inpatient Acute Rehabilitation admission.  The patient will receive intensive therapy as well as Rehabilitation physician, nursing, social worker, and care management interventions.  Due to safety, skin/wound care, disease management, medication administration, pain management, and patient education the patient requires 24 hour a day rehabilitation nursing.  The patient is currently mod A with mobility and basic ADLs.  Discharge setting and therapy post discharge at home with home health is anticipated.  Patient has agreed to participate in the Acute Inpatient Rehabilitation Program and will admit today.  Preadmission Screen Completed By:  Nelly Laurence, 07/03/2022 2:23 PM ______________________________________________________________________   Discussed status with Dr. Naaman Plummer on 07/03/22 at 14:27 and received approval for admission today.  Admission Coordinator:  Nelly Laurence, time 14:27/Date 07/03/22   Assessment/Plan: Diagnosis: right intertrochanteric fx s/p IMN Does the need for close, 24 hr/day Medical supervision in concert with the patient's rehab needs make it unreasonable for this patient to be served in a less intensive setting? Yes Co-Morbidities requiring supervision/potential complications: vertigo, anemia, depression, OCD,  anxiey/depression Due to bladder management, bowel management, safety, skin/wound care, disease management, medication administration, pain management, and patient education, does the patient require 24 hr/day rehab nursing? Yes Does the patient require coordinated care of a physician, rehab nurse, PT, OT, and SLP to address physical and functional deficits in the context of the above medical diagnosis(es)? Yes Addressing deficits in the following areas: balance, endurance, locomotion, strength, transferring, bowel/bladder control, bathing, dressing, feeding, grooming, toileting, and psychosocial support Can the patient actively participate in an intensive therapy program of at least 3 hrs of therapy 5 days a week? Yes The potential for patient to make measurable gains while on inpatient rehab is excellent Anticipated functional outcomes upon discharge from inpatient rehab: modified independent PT, modified independent OT, n/a SLP Estimated rehab length of stay to reach the above functional goals is: 14 days Anticipated discharge destination: Home 10. Overall Rehab/Functional Prognosis: excellent   MD Signature: Meredith Staggers, MD, [place] Director Rehabilitation Services 07/03/2022

## 2022-07-03 NOTE — H&P (Signed)
Physical Medicine and Rehabilitation Admission H&P    Chief Complaint  Patient presents with   Functional deficits secondary to     Fall and hip fracture    HPI:  Amber Ruiz is an 81 year old female with history of OCD, anxiety, anemia, vertigo in the past; who was admitted after reports of fall onto her right hip the night PTA, she stayed on the floor from 10 pm to 11 am till family found her. She was admitted next day on 07/01/22 for work up. She was found to have right IT femur fracture and underwent ORIF on 08/13 by Dr. Jena Gauss. Post op WBAT and on Lovenox for DVT prophylaxis. Follow up labs show ABLA which is being monitored. Leucocytosis with rise in WBC to 12.9 being monitored.  PT evaluations done revealing right nystagmus which was treated with modified L Dix Hallpike maneuvers.  OT evaluation completed today revealing pain, decrease in activity tolerance as well as difficulty advancing RLE. CIR recommended due to functional declein.    Review of Systems  Constitutional:  Negative for chills and fever.  HENT:  Negative for hearing loss.   Respiratory:  Negative for cough and shortness of breath.   Cardiovascular:  Negative for chest pain and palpitations.  Gastrointestinal:  Positive for constipation. Negative for heartburn, nausea and vomiting.  Genitourinary:  Negative for dysuria and urgency.  Musculoskeletal:  Positive for joint pain.  Skin:  Negative for itching and rash.  Neurological:  Positive for dizziness and weakness.     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 Was teacher, apartment manager--retired at age 65. Independent without AD. Children live in [place]. She reports that she has never smoked. She has never used smokeless tobacco. She reports that she does not drink alcohol. No history on file for drug use.   Allergies: No Known Allergies   Medications Prior to Admission  Medication Sig Dispense Refill   acetaminophen (TYLENOL) 500 MG tablet Take 1,000 mg by mouth every 6 (six) hours as needed for mild pain.     alendronate (FOSAMAX) 70 MG tablet Take 70 mg by mouth once a week.     cetirizine (ZYRTEC) 5 MG tablet Take 5 mg by mouth daily.     citalopram (CELEXA) 40 MG tablet Take 20 mg by mouth daily.      FEROSUL 325 (65 Fe) MG tablet Take 325 mg by mouth daily.     lisinopril (ZESTRIL) 10 MG tablet Take 10 mg by mouth daily.     pantoprazole (PROTONIX) 20 MG tablet Take 20 mg by mouth daily.        Home: Home Living Family/patient expects to be discharged to:: Private residence Living Arrangements: Alone Available Help at Discharge: Family, Friend(s) Type of Home: House Home Access: Stairs to enter [employer] of Steps: 4-6 (4 in garage without rails; 6 in front with R&L) Entrance  Stairs-Rails: Right, Left, Can reach both Home Layout: Two level, 1/2 bath on main level Alternate Level Stairs-Number of Steps: 12-13 Alternate Level Stairs-Rails: Right Bathroom Shower/Tub: Psychologist, counselling, Engineer, manufacturing systems: Standard Bathroom Accessibility: Yes Home Equipment: Agricultural consultant (2 wheels) Additional Comments: bed rails attached to bed   Functional History: Prior Function Prior Level of Function : Independent/Modified Independent Mobility Comments: does not use AD  Functional Status:  Mobility: Bed Mobility Overal bed mobility: Needs Assistance Bed Mobility: Supine to  Sit Supine to sit: Max assist, HOB elevated General bed mobility comments: required assistance to manage movement of RLE towards EOB. Max A to flex hips to bring trunk forward to sit on EOB. Transfers Overall transfer level: Needs assistance Equipment used: Rolling walker (2 wheels) Transfers: Sit to/from Stand, Bed to chair/wheelchair/BSC Sit to Stand: Min assist Bed to/from chair/wheelchair/BSC transfer type:: Step pivot Step pivot transfers: Mod assist General transfer comment: from recliner with use of bil armrests Ambulation/Gait Ambulation/Gait assistance: Mod assist Gait Distance (Feet): 5 Feet Assistive device: Rolling walker (2 wheels) Gait Pattern/deviations: Step-to pattern, Decreased step length - right, Decreased stance time - right, Decreased weight shift to right, Decreased stride length, Antalgic General Gait Details: ModA to cue weight shifting, advance R leg, Pt having difficulty accepting weight on R leg and moving R leg to step, needing assistance. Chair brought to pt after 4 feet forward, 1 foot backward. Gait velocity: reduced Gait velocity interpretation: <1.31 ft/sec, indicative of household ambulator    ADL: ADL Overall ADL's : Needs assistance/impaired Eating/Feeding: Modified independent, Bed level Grooming: Wash/dry hands, Wash/dry face, Oral care, Set up, Sitting Upper Body Bathing: Sitting, Minimal assistance Lower Body Bathing: Total assistance, Bed level Upper Body Dressing : Minimal assistance, Sitting Lower Body Dressing: Total assistance, Bed level Toilet Transfer: Moderate assistance, Rolling walker (2 wheels), Cueing for sequencing, Cueing for safety Toilet Transfer Details (indicate cue type and reason): step pivot; towards right Toileting- Clothing Manipulation and Hygiene: Total assistance, Sit to/from stand General ADL Comments: Slow and labored movements performed during bed mobility and functional transfers. Pt with reports of dizziness once  seated on EOB requiring increased time to calm down prior to standing. Therapist provided education on sequencing RW with coordinating steps when transferring from bed to recliner.  Cognition: Cognition Overall Cognitive Status: Within Functional Limits for tasks assessed Orientation Level: Oriented X4 Cognition Arousal/Alertness: Awake/alert Behavior During Therapy: WFL for tasks assessed/performed Overall Cognitive Status: Within Functional Limits for tasks assessed General Comments: Pt just received dilaudid, and pt aware she was drifitng off during conversations at times. Pt also repetitive with questionable memory and needs cues for sequencing mobility, but unsure if related to meds or baseline.   Blood pressure (!) 126/53, pulse 78, temperature 98.2 F (36.8 C), temperature source Oral, resp. rate 17, height 5' (1.524 m), weight 61.2 kg, SpO2 98 %. Physical Exam Vitals and nursing note reviewed.  Constitutional:      Appearance: Normal appearance.  HENT:     Head: Normocephalic.     Right Ear: External ear normal.     Left Ear: External ear normal.     Nose: Nose normal.  Eyes:     Extraocular Movements: Extraocular movements intact.     Pupils: Pupils are equal, round, and reactive to light.  Cardiovascular:     Rate and Rhythm: Normal rate and regular rhythm.     Heart sounds: No murmur heard.    No gallop.  Pulmonary:  Effort: Pulmonary effort is normal.  Abdominal:     General: Bowel sounds are normal. There is no distension.     Palpations: Abdomen is soft.     Tenderness: There is no abdominal tenderness.  Musculoskeletal:        General: Swelling (Right hip) and tenderness present.     Cervical back: Normal range of motion.  Skin:    General: Skin is warm.     Comments: Right hip incision  CDI with foam dressing.   Neurological:     Mental Status: She is alert and oriented to person, place, and time.     Cranial Nerves: No cranial nerve deficit.      Sensory: No sensory deficit.     Coordination: Coordination normal.     Comments: Right hip limited by pain. Otherwise motor 5/5 UE, 3-4+/5 LLE, Right ADF/APF 4/5. No sensory findings. DTR's 1+  Psychiatric:        Mood and Affect: Mood normal.        Behavior: Behavior normal.     Results for orders placed or performed during the hospital encounter of 07/01/22 (from the past 48 hour(s))  ABO/Rh     Status: None   Collection Time: 07/02/22 12:42 AM  Result Value Ref Range   ABO/RH(D)      A POS Performed at [facility], [address] [place], [place] [address]   CBC     Status: Abnormal   Collection Time: 07/02/22 12:45 AM  Result Value Ref Range   WBC 8.8 4.0 - 10.5 K/uL   RBC 3.76 (L) 3.87 - 5.11 MIL/uL   Hemoglobin 10.0 (L) 12.0 - 15.0 g/dL   HCT 19.1 (L) 47.8 - 29.5 %   MCV 86.4 80.0 - 100.0 fL   MCH 26.6 26.0 - 34.0 pg   MCHC 30.8 30.0 - 36.0 g/dL   RDW 62.1 30.8 - 65.7 %   Platelets 226 150 - 400 K/uL   nRBC 0.0 0.0 - 0.2 %    Comment: Performed at [facility], [address] [place], [place] [address]  Surgical pcr screen     Status: None   Collection Time: 07/02/22  7:06 AM   Specimen: Nasal Mucosa; Nasal Swab  Result Value Ref Range   MRSA, PCR NEGATIVE NEGATIVE   Staphylococcus aureus NEGATIVE NEGATIVE    Comment: (NOTE) The Xpert SA Assay (FDA approved for NASAL specimens in patients 51 years of age and older), is one component of a comprehensive surveillance program. It is not intended to diagnose infection nor to guide or monitor treatment. Performed at [facility], [address], [place], [place] [address]   VITAMIN D 25 Hydroxy (Vit-D Deficiency, Fractures)     Status: Abnormal   Collection Time: 07/02/22 11:23 AM  Result Value Ref Range   Vit D, 25-Hydroxy 19.74 (L) 30 - 100 ng/mL    Comment: (NOTE) Vitamin D deficiency has been defined by the Institute of Medicine  and an Endocrine Society practice guideline as a level of  serum 25-OH  vitamin D less than 20 ng/mL (1,2). The Endocrine Society went on to  further define vitamin D insufficiency as a level between 21 and 29  ng/mL (2).  1. IOM (Institute of Medicine). 2010. Dietary reference intakes for  calcium and D. [place]: [employer]. 2. Holick MF, Binkley Hawley, Bischoff-Ferrari HA, et al. Evaluation,  treatment, and prevention of vitamin D deficiency: an  Endocrine  Society clinical practice guideline, JCEM. 2011 Jul; 96(7): 1911-30.  Performed at [facility], [address], [place], [place] [address]   Creatinine, serum     Status: None   Collection Time: 07/02/22 11:23 AM  Result Value Ref Range   Creatinine, Ser 0.90 0.44 - 1.00 mg/dL   GFR, Estimated >91 >47 mL/min    Comment: (NOTE) Calculated using the CKD-EPI Creatinine Equation (2021) Performed at [facility], [address], [place], [place] [address]   Basic metabolic panel     Status: Abnormal   Collection Time: 07/03/22  2:15 AM  Result Value Ref Range   Sodium 136 135 - 145 mmol/L   Potassium 4.5 3.5 - 5.1 mmol/L   Chloride 104 98 - 111 mmol/L   CO2 26 22 - 32 mmol/L   Glucose, Bld 144 (H) 70 - 99 mg/dL    Comment: Glucose reference range applies only to samples taken after fasting for at least 8 hours.   BUN 14 8 - 23 mg/dL   Creatinine, Ser 2.13 0.44 - 1.00 mg/dL   Calcium 7.9 (L) 8.9 - 10.3 mg/dL   GFR, Estimated >08 >65 mL/min    Comment: (NOTE) Calculated using the CKD-EPI Creatinine Equation (2021)    Anion gap 6 5 - 15    Comment: Performed at [facility], [address], [place], [place] [address]  CBC     Status: Abnormal   Collection Time: 07/03/22  2:15 AM  Result Value Ref Range   WBC 12.9 (H) 4.0 - 10.5 K/uL   RBC 3.23 (L) 3.87 - 5.11 MIL/uL   Hemoglobin 8.8 (L) 12.0 - 15.0 g/dL   HCT 62.9 (L) 52.8 - 41.3 %   MCV 87.0 80.0 - 100.0 fL   MCH 27.2 26.0 - 34.0 pg   MCHC 31.3 30.0 - 36.0 g/dL   RDW 24.4 01.0 - 27.2 %    Platelets 204 150 - 400 K/uL   nRBC 0.0 0.0 - 0.2 %    Comment: Performed at [facility], [address] [place], [place] [address]   ECHOCARDIOGRAM COMPLETE  Result Date: 07/02/2022    ECHOCARDIOGRAM REPORT   Patient Name:   [facility] Date of Exam: 07/02/2022 Medical Rec #:  [id number]       Height:       60.0 in Accession #:    [id number]      Weight:       135.0 lb Date of Birth:  11-Jul-1941        BSA:          1.579 m Patient Age:    81 years        BP:           108/65 mmHg Patient Gender: F               HR:           75 bpm. Exam Location:  Inpatient Procedure: 2D Echo Indications:    syncope  History:        Patient has no prior history of Echocardiogram examinations.  Sonographer:    Delcie Roch RDCS Referring Phys: [id number] [place]eline General IMPRESSIONS  1. Left ventricular ejection fraction, by estimation, is 60 to 65%. The left ventricle has normal function. The left ventricle has no regional wall motion abnormalities. Left ventricular diastolic parameters are consistent with Grade I diastolic dysfunction (impaired relaxation).  2. Right  ventricular systolic function is normal. The right ventricular size is normal. There is normal pulmonary artery systolic pressure.  3. The mitral valve is normal in structure. No evidence of mitral valve regurgitation. No evidence of mitral stenosis.  4. Tricuspid valve regurgitation is moderate, most prominent in Img series 45. Unable to visualize well in over view; unable to view hepatin vein or IVC.  5. The aortic valve is tricuspid. Aortic valve regurgitation is not visualized. No aortic stenosis is present. Comparison(s): No prior Echocardiogram. FINDINGS  Left Ventricle: Left ventricular ejection fraction, by estimation, is 60 to 65%. The left ventricle has normal function. The left ventricle has no regional wall motion abnormalities. The left ventricular internal cavity size was normal in size. There is  no left ventricular hypertrophy. Left  ventricular diastolic parameters are consistent with Grade I diastolic dysfunction (impaired relaxation). Right Ventricle: The right ventricular size is normal. No increase in right ventricular wall thickness. Right ventricular systolic function is normal. There is normal pulmonary artery systolic pressure. The tricuspid regurgitant velocity is 2.74 m/s, and  with an assumed right atrial pressure of 3 mmHg, the estimated right ventricular systolic pressure is 33.0 mmHg. Left Atrium: Left atrial size was normal in size. Right Atrium: Right atrial size was normal in size. Pericardium: There is no evidence of pericardial effusion. Mitral Valve: The mitral valve is normal in structure. No evidence of mitral valve regurgitation. No evidence of mitral valve stenosis. Tricuspid Valve: The tricuspid valve is normal in structure. Tricuspid valve regurgitation is moderate . No evidence of tricuspid stenosis. Aortic Valve: The aortic valve is tricuspid. Aortic valve regurgitation is not visualized. No aortic stenosis is present. Pulmonic Valve: The pulmonic valve was normal in structure. Pulmonic valve regurgitation is trivial. No evidence of pulmonic stenosis. Aorta: The aortic root, ascending aorta and aortic arch are all structurally normal, with no evidence of dilitation or obstruction. IAS/Shunts: No atrial level shunt detected by color flow Doppler.  LEFT VENTRICLE PLAX 2D LVIDd:         3.80 cm   Diastology LVIDs:         2.20 cm   LV e' medial:    5.98 cm/s LV PW:         0.80 cm   LV E/e' medial:  19.1 LV IVS:        0.70 cm   LV e' lateral:   5.55 cm/s LVOT diam:     1.60 cm   LV E/e' lateral: 20.5 LV SV:         45 LV SV Index:   28 LVOT Area:     2.01 cm  RIGHT VENTRICLE RV S prime:     16.30 cm/s TAPSE (M-mode): 2.2 cm LEFT ATRIUM             Index        RIGHT ATRIUM           Index LA diam:        2.60 cm 1.65 cm/m   RA Area:     10.60 cm LA Vol (A2C):   28.3 ml 17.92 ml/m  RA Volume:   22.10 ml  13.99 ml/m  LA Vol (A4C):   42.1 ml 26.65 ml/m LA Biplane Vol: 35.9 ml 22.73 ml/m  AORTIC VALVE LVOT Vmax:   107.00 cm/s LVOT Vmean:  73.700 cm/s LVOT VTI:    0.223 m  AORTA Ao Root diam: 2.80 cm Ao Asc diam:  2.60 cm MITRAL VALVE  TRICUSPID VALVE MV Area (PHT): 4.06 cm     TR Peak grad:   30.0 mmHg MV Decel Time: 187 msec     TR Vmax:        274.00 cm/s MV E velocity: 114.00 cm/s MV A velocity: 150.00 cm/s  SHUNTS MV E/A ratio:  0.76         Systemic VTI:  0.22 m                             Systemic Diam: 1.60 cm Riley LamMahesh Chandrasekhar MD Electronically signed by Riley LamMahesh Chandrasekhar MD Signature Date/Time: 07/02/2022/2:58:01 PM    Final    DG FEMUR, MIN 2 VIEWS RIGHT  Result Date: 07/02/2022 CLINICAL DATA:  [id number]; [id number]; [id number]; ORIF right femoral neck EXAM: Three views, 6 images of the right femur and right hip were obtained intraoperatively without presence of radiologist. Fluoro time: 53 seconds.  Fluoro dose: 6.28 mGy COMPARISON:  July 01, 2022 FINDINGS: There are ORIF changes seen with intramedullary rod and compression screw devices transfixing the right intertrochanteric fracture of the femur. The alignment is near anatomical. Please see intraoperative findings for further detail. IMPRESSION: ORIF changes with intramedullary rod and compressive screw devices transfix the intertrochanteric fracture of the right femur. The alignment is near anatomical. Electronically Signed   By: Marjo BickerAmar  Amaresh M.D.   On: 07/02/2022 10:10   DG HIP PORT UNILAT W OR W/O PELVIS 1V RIGHT  Result Date: 07/02/2022 CLINICAL DATA:  [id number]; [id number]; ORIF right femoral neck EXAM: Three views, 6 images of the right femur and right hip were obtained intraoperatively without presence of radiologist. Fluoro time: 53 seconds.  Fluoro dose: 6.28 mGy COMPARISON:  July 01, 2022 FINDINGS: There are ORIF changes seen with intramedullary rod and compression screw devices transfixing the right intertrochanteric fracture of the femur. The  alignment is near anatomical. Please see intraoperative findings for further detail. IMPRESSION: ORIF changes with intramedullary rod and compressive screw devices transfix the intertrochanteric fracture of the right femur. The alignment is near anatomical. Electronically Signed   By: Marjo BickerAmar  Amaresh M.D.   On: 07/02/2022 10:10   CT Knee Right Wo Contrast  Result Date: 07/01/2022 CLINICAL DATA:  Possible undisplaced fracture in the distal femur on recent plain film examination EXAM: CT OF THE RIGHT KNEE WITHOUT CONTRAST TECHNIQUE: Multidetector CT imaging of the right knee was performed according to the standard protocol. Multiplanar CT image reconstructions were also generated. RADIATION DOSE REDUCTION: This exam was performed according to the departmental dose-optimization program which includes automated exposure control, adjustment of the mA and/or kV according to patient size and/or use of iterative reconstruction technique. COMPARISON:  Plain film from earlier in the same day. FINDINGS: Bones/Joint/Cartilage Distal femur shows no evidence of acute fracture. The lucencies are felt to be related to variation in the mineralization laterally. The proximal tibia and fibula are well visualized without evidence of acute fracture. Subchondral sclerosis is noted in the medial tibial plateau consistent with mild degenerative change. Some osteophytic changes are noted as well. Patella appears within normal limits. Ligaments Suboptimally assessed by CT. Muscles and Tendons Surrounding musculature appears within normal limits. Soft tissues No significant soft tissue edema is noted. No sizable joint effusion is seen. IMPRESSION: No evidence of acute fracture. Lucency seen previously are related to the degree of mineralization in the distal femur laterally. Mild degenerative changes of the medial knee joint are seen. Electronically Signed   By: Loraine LericheMark  Lukens M.D.   On: 07/01/2022 19:26      Blood pressure (!) 126/53,  pulse 78, temperature 98.2 F (36.8 C), temperature source Oral, resp. rate 17, height 5' (1.524 m), weight 61.2 kg, SpO2 98 %.  Medical Problem List and Plan: 1. Functional deficits secondary to a right intertrochanteric hip fx s/p ORIF 07/01/22  -patient may shower  -ELOS/Goals: 10-14 day, mod I with PT and OT 2.  Antithrombotics: -DVT/anticoagulation:  Pharmaceutical: Lovenox  -antiplatelet therapy: N/a 3. Pain Management: Hydrocodone prn.  4. Mood/Behavior/Sleep: LCSW to follow for evaluation and support.   --has been sleeping well.   -antipsychotic agents: N/A 5. Neuropsych/cognition: This patient is capable of making decisions on her own behalf. 6. Skin/Wound Care: Routine pressure relief measures.  7. Fluids/Electrolytes/Nutrition: Monitor I/O. Check Lytes in am.  8. Anxiety d/o: Managed with celexa. 9. ABLA: Recheck CBC in am.  10. Leucocytosis: wound clean, chest clear. Likely reactive  -recheck cbc in am 11.  Constipation: Will add senna to colace.       Jacquelynn Cree, PA-C 07/03/2022

## 2022-07-03 NOTE — Plan of Care (Signed)
  Problem: Education: Goal: Knowledge of General Education information will improve Description Including pain rating scale, medication(s)/side effects and non-pharmacologic comfort measures Outcome: Progressing   Problem: Health Behavior/Discharge Planning: Goal: Ability to manage health-related needs will improve Outcome: Progressing   

## 2022-07-03 NOTE — Progress Notes (Signed)
PMR Admission Coordinator Pre-Admission Assessment  Patient: Amber Ruiz is an 81 y.o., female MRN: [id number] DOB: 03/15/1941 Height: 5' (152.4 cm) Weight: 61.2 kg  Insurance Information HMO:     PPO:      PCP:      IPA:      80/20:      OTHER:  PRIMARY: Medicare A & B      Policy#: [id number]      Subscriber: Patient CM Name:       Phone#:      Fax#:  Pre-Cert#: verified through Passport      Employer:  Benefits:  Phone #:      Name:  Eff. Date: 01/18/2006     Deduct: $1600      Out of Pocket Max: 0      Life Max:  CIR: 100%      SNF: 20 full days Outpatient:  80%    Co-Pay: 20% Home Health:  100%     Co-Pay: 0 DME:  80%    Co-Pay: 20% Providers:  SECONDARY: BCBS      Policy#: [id number]     Phone#:   Financial Counselor:       Phone#:   The "Data Collection Information Summary" for patients in Inpatient Rehabilitation Facilities with attached "Privacy Act Statement-Health Care Records" was provided and verbally reviewed with: Patient  Emergency Contact Information Contact Information     Name Relation Home Work Mobile   Callaham, Laura Daughter   [phone]   Gerling, Tommy Son   [phone]       Current Medical History  Patient Admitting Diagnosis: fall, femur fracture, s/p ORIF History of Present Illness: Patient is an 81 year old female admitted 07/01/22 after falling at home and sustained a comminuted, displaced intertrochanteric R hip fx. S/p cephalomedullary nailing of R intertrochanteric femur fx 8/13. PMH: auricles, HTN, OCD, anxiety/depression, vertigo and anemia. Current Hgb is 8.8 with MD advising daily cbc and transfusion if <7 or patient symptomatic. Therapies recommending intensive physical rehab.     Patient's medical record from [facility] has been reviewed by the rehabilitation admission coordinator and physician.  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 No  Family History   family history is not on file.  Current Medications  Current Facility-Administered Medications:    acetaminophen (TYLENOL) tablet 1,000 mg, 1,000 mg, Oral, Q6H PRN, McClung, Sarah A, PA-C, 1,000 mg at 07/03/22 0235   bisacodyl (DULCOLAX) EC tablet 5 mg, 5 mg, Oral, Daily PRN, McClung, Sarah A, PA-C   citalopram (CELEXA) tablet 20 mg, 20 mg, Oral, Daily, McClung, Sarah A, PA-C, 20 mg at 07/03/22 1001   docusate sodium (COLACE) capsule 100 mg, 100 mg, Oral, BID, McClung, Sarah A, PA-C, 100 mg at 07/03/22 1001   enoxaparin (LOVENOX) injection 40 mg, 40 mg, Subcutaneous, Q24H, McClung, Sarah A, PA-C, 40 mg at 07/03/22 1001   ferrous sulfate tablet 325 mg, 325 mg, Oral, Daily, McClung, Sarah A, PA-C, 325 mg at 07/03/22 1001   HYDROcodone-acetaminophen (NORCO/VICODIN) 5-325 MG per tablet 1-2 tablet, 1-2 tablet, Oral, Q4H PRN, McClung, Sarah A, PA-C, 2 tablet at 07/03/22 1001   HYDROmorphone (DILAUDID) injection 0.5 mg, 0.5 mg, Intravenous, Q2H PRN, McClung, Sarah A, PA-C,   0.5 mg at 07/02/22 1446   loratadine (CLARITIN) tablet 10 mg, 10 mg, Oral, Daily, McClung, Sarah A, PA-C, 10 mg at 07/03/22 1001   metoCLOPramide (REGLAN) tablet 5-10 mg, 5-10 mg, Oral, Q8H PRN **OR** metoCLOPramide (REGLAN) injection 5-10 mg, 5-10 mg, Intravenous, Q8H PRN, McClung, Sarah A, PA-C   ondansetron (ZOFRAN) tablet 4 mg, 4 mg, Oral, Q6H PRN **OR** ondansetron (ZOFRAN) injection 4 mg, 4 mg, Intravenous, Q6H PRN, McClung, Sarah A, PA-C   pantoprazole (PROTONIX) EC tablet 20 mg, 20 mg, Oral, Daily, McClung, Sarah A, PA-C, 20 mg at 07/03/22 1001   polyethylene glycol (MIRALAX / GLYCOLAX) packet 17 g, 17 g, Oral, Daily PRN, McClung, Sarah A, PA-C   senna-docusate (Senokot-S) tablet 1 tablet, 1 tablet, Oral, QHS PRN, McClung, Sarah A, PA-C   Vitamin D (Ergocalciferol)  (DRISDOL) 1.25 MG (50000 UNIT) capsule 50,000 Units, 50,000 Units, Oral, Q7 days, Vann, Jessica U, DO, 50,000 Units at 07/03/22 1234  Patients Current Diet:  Diet Order             Diet regular Room service appropriate? Yes; Fluid consistency: Thin  Diet effective now           Diet general                   Precautions / Restrictions Precautions Precautions: Fall, Other (comment) Precaution Comments: watch BP and SpO2; vertigo Restrictions Weight Bearing Restrictions: Yes RLE Weight Bearing: Weight bearing as tolerated   Has the patient had 2 or more falls or a fall with injury in the past year? Yes  Prior Activity Level Limited Community (1-2x/wk): ambulating without AD  Prior Functional Level Self Care: Did the patient need help bathing, dressing, using the toilet or eating? Independent  Indoor Mobility: Did the patient need assistance with walking from room to room (with or without device)? Independent  Stairs: Did the patient need assistance with internal or external stairs (with or without device)? Independent  Functional Cognition: Did the patient need help planning regular tasks such as shopping or remembering to take medications? Independent  Patient Information Are you of Hispanic, Latino/a,or Spanish origin?: A. No, not of Hispanic, Latino/a, or Spanish origin What is your race?: A. White Do you need or want an interpreter to communicate with a doctor or health care staff?: 0. No  Patient's Response To:  Health Literacy and Transportation Is the patient able to respond to health literacy and transportation needs?: Yes Health Literacy - How often do you need to have someone help you when you read instructions, pamphlets, or other written material from your doctor or pharmacy?: Never In the past 12 months, has lack of transportation kept you from medical appointments or from getting medications?: No In the past 12 months, has lack of transportation kept you  from meetings, work, or from getting things needed for daily living?: No  Home Assistive Devices / Equipment Home Assistive Devices/Equipment: Walker (specify type) Home Equipment: Rolling Walker (2 wheels)  Prior Device Use: Indicate devices/aids used by the patient prior to current illness, exacerbation or injury? None of the above  Current Functional Level Cognition  Overall Cognitive Status: Within Functional Limits for tasks assessed Orientation Level: Oriented X4 General Comments: Pt just received dilaudid, and pt aware she was drifitng off during conversations at times. Pt also repetitive with questionable memory and needs cues for sequencing mobility, but unsure if related to meds or baseline.    Extremity Assessment (includes Sensation/Coordination)  Upper Extremity Assessment: Generalized   weakness  Lower Extremity Assessment: Defer to PT evaluation RLE Deficits / Details: noted edema around hip and thigh; pain impacting AROM and strength and WB tolerance    ADLs  Overall ADL's : Needs assistance/impaired Eating/Feeding: Modified independent, Bed level Grooming: Wash/dry hands, Wash/dry face, Oral care, Set up, Sitting Upper Body Bathing: Sitting, Minimal assistance Lower Body Bathing: Total assistance, Bed level Upper Body Dressing : Minimal assistance, Sitting Lower Body Dressing: Total assistance, Bed level Toilet Transfer: Moderate assistance, Rolling walker (2 wheels), Cueing for sequencing, Cueing for safety Toilet Transfer Details (indicate cue type and reason): step pivot; towards right Toileting- Clothing Manipulation and Hygiene: Total assistance, Sit to/from stand General ADL Comments: Slow and labored movements performed during bed mobility and functional transfers. Pt with reports of dizziness once seated on EOB requiring increased time to calm down prior to standing. Therapist provided education on sequencing RW with coordinating steps when transferring from bed  to recliner.    Mobility  Overal bed mobility: Needs Assistance Bed Mobility: Supine to Sit Supine to sit: Max assist, HOB elevated General bed mobility comments: required assistance to manage movement of RLE towards EOB. Max A to flex hips to bring trunk forward to sit on EOB.    Transfers  Overall transfer level: Needs assistance Equipment used: Rolling walker (2 wheels) Transfers: Sit to/from Stand, Bed to chair/wheelchair/BSC Sit to Stand: Mod assist Bed to/from chair/wheelchair/BSC transfer type:: Step pivot Step pivot transfers: Mod assist General transfer comment: ModA to facilitate anterior weight shift as pt has posterior bias. ModA to cue weight shifting, advance R leg, and turn RW to stand step to R bed > recliner.    Ambulation / Gait / Stairs / Wheelchair Mobility  Ambulation/Gait Ambulation/Gait assistance: Mod assist Gait Distance (Feet): 2 Feet Assistive device: Rolling walker (2 wheels) Gait Pattern/deviations: Step-to pattern, Decreased step length - right, Decreased stance time - right, Decreased weight shift to right, Decreased stride length, Antalgic General Gait Details: ModA to cue weight shifting, advance R leg, and turn RW to stand step to R bed > recliner. Pt having difficulty accepting weight on R leg and moving R leg to step, needing assistance. Gait velocity: reduced Gait velocity interpretation: <1.31 ft/sec, indicative of household ambulator    Posture / Balance Dynamic Sitting Balance Sitting balance - Comments: minA for static sitting balance, posterior bias Balance Overall balance assessment: Needs assistance Sitting-balance support: Bilateral upper extremity supported, Feet supported Sitting balance-Leahy Scale: Poor Sitting balance - Comments: minA for static sitting balance, posterior bias Postural control: Left lateral lean (to guard right hip) Standing balance support: Bilateral upper extremity supported, During functional activity, Reliant on  assistive device for balance Standing balance-Leahy Scale: Poor Standing balance comment: reliant on RW and external physical assistance    Special needs/care consideration Skin surgical wound   Previous Home Environment (from acute therapy documentation) Living Arrangements: Alone Available Help at Discharge: Family, Friend(s) Type of Home: House Home Layout: Two level, 1/2 bath on main level Alternate Level Stairs-Rails: Right Alternate Level Stairs-Number of Steps: 12-13 Home Access: Stairs to enter Entrance Stairs-Rails: Right, Left, Can reach both Entrance Stairs-Number of Steps: 4-6 (4 in garage without rails; 6 in front with R&L) Bathroom Shower/Tub: Walk-in shower, Tub/shower unit Bathroom Toilet: Standard Bathroom Accessibility: Yes How Accessible: Accessible via walker Additional Comments: bed rails attached to bed  Discharge Living Setting Plans for Discharge Living Setting: Patient's home Discharge Home Layout: Two level Alternate Level Stairs-Rails: Right Alternate Level Stairs-Number of Steps:   12 Discharge Home Access: Stairs to enter Entrance Stairs-Rails: Right, Left, Can reach both Entrance Stairs-Number of Steps: 6 Discharge Bathroom Shower/Tub: Walk-in shower Discharge Bathroom Toilet: Standard Does the patient have any problems obtaining your medications?: No  Social/Family/Support Systems Anticipated Caregiver: son, Tommy/ Daughter, Laura Anticipated Caregiver's Contact Information: [phone] Caregiver Availability: Intermittent Discharge Plan Discussed with Primary Caregiver: Yes Is Caregiver In Agreement with Plan?: Yes Does Caregiver/Family have Issues with Lodging/Transportation while Pt is in Rehab?: No  Goals Patient/Family Goal for Rehab: Mod I PT, OT Expected length of stay: 14 days Pt/Family Agrees to Admission and willing to participate: Yes Program Orientation Provided & Reviewed with Pt/Caregiver Including Roles  &  Responsibilities: Yes  Barriers to Discharge: Insurance for SNF coverage  Decrease burden of Care through IP rehab admission: OtherN/A  Possible need for SNF placement upon discharge: Not anticipated  Patient Condition: I have reviewed medical records from [facility], spoken with  TOC , and patient and daughter. I met with patient at the bedside and discussed via phone for inpatient rehabilitation assessment.  Patient will benefit from ongoing PT and OT, can actively participate in 3 hours of therapy a day 5 days of the week, and can make measurable gains during the admission.  Patient will also benefit from the coordinated team approach during an Inpatient Acute Rehabilitation admission.  The patient will receive intensive therapy as well as Rehabilitation physician, nursing, social worker, and care management interventions.  Due to safety, skin/wound care, disease management, medication administration, pain management, and patient education the patient requires 24 hour a day rehabilitation nursing.  The patient is currently mod A with mobility and basic ADLs.  Discharge setting and therapy post discharge at home with home health is anticipated.  Patient has agreed to participate in the Acute Inpatient Rehabilitation Program and will admit today.  Preadmission Screen Completed By:  Elmo Shumard H [place], 07/03/2022 2:23 PM ______________________________________________________________________   Discussed status with Dr. Swartz on 07/03/22 at 14:27 and received approval for admission today.  Admission Coordinator:  Brie Eppard H Mikal Wisman, time 14:27/Date 07/03/22   Assessment/Plan: Diagnosis: right intertrochanteric fx s/p IMN Does the need for close, 24 hr/day Medical supervision in concert with the patient's rehab needs make it unreasonable for this patient to be served in a less intensive setting? Yes Co-Morbidities requiring supervision/potential complications: vertigo, anemia, depression, OCD,  anxiey/depression Due to bladder management, bowel management, safety, skin/wound care, disease management, medication administration, pain management, and patient education, does the patient require 24 hr/day rehab nursing? Yes Does the patient require coordinated care of a physician, rehab nurse, PT, OT, and SLP to address physical and functional deficits in the context of the above medical diagnosis(es)? Yes Addressing deficits in the following areas: balance, endurance, locomotion, strength, transferring, bowel/bladder control, bathing, dressing, feeding, grooming, toileting, and psychosocial support Can the patient actively participate in an intensive therapy program of at least 3 hrs of therapy 5 days a week? Yes The potential for patient to make measurable gains while on inpatient rehab is excellent Anticipated functional outcomes upon discharge from inpatient rehab: modified independent PT, modified independent OT, n/a SLP Estimated rehab length of stay to reach the above functional goals is: 14 days Anticipated discharge destination: Home 10. Overall Rehab/Functional Prognosis: excellent   MD Signature: Zachary T. Swartz, MD, FAAPMR [facility] Medical Director Rehabilitation Services 07/03/2022    upon discharge from inpatient rehab: modified independent PT, modified independent OT, n/a SLP Estimated rehab length of stay to reach the above functional goals is: 14 days Anticipated discharge destination: Home 10. Overall Rehab/Functional Prognosis: excellent     MD Signature: Meredith Staggers, MD, [place] Director Rehabilitation Services 07/03/2022

## 2022-07-04 DIAGNOSIS — K5900 Constipation, unspecified: Secondary | ICD-10-CM | POA: Diagnosis not present

## 2022-07-04 DIAGNOSIS — D62 Acute posthemorrhagic anemia: Secondary | ICD-10-CM | POA: Diagnosis not present

## 2022-07-04 DIAGNOSIS — D72829 Elevated white blood cell count, unspecified: Secondary | ICD-10-CM | POA: Diagnosis not present

## 2022-07-04 DIAGNOSIS — S7291XD Unspecified fracture of right femur, subsequent encounter for closed fracture with routine healing: Secondary | ICD-10-CM

## 2022-07-04 LAB — COMPREHENSIVE METABOLIC PANEL
ALT: 7 U/L (ref 0–44)
AST: 18 U/L (ref 15–41)
Albumin: 2.5 g/dL — ABNORMAL LOW (ref 3.5–5.0)
Alkaline Phosphatase: 44 U/L (ref 38–126)
Anion gap: 7 (ref 5–15)
BUN: 16 mg/dL (ref 8–23)
CO2: 24 mmol/L (ref 22–32)
Calcium: 8.1 mg/dL — ABNORMAL LOW (ref 8.9–10.3)
Chloride: 109 mmol/L (ref 98–111)
Creatinine, Ser: 0.67 mg/dL (ref 0.44–1.00)
GFR, Estimated: >60 mL/min (ref >60)
Glucose, Bld: 108 mg/dL — ABNORMAL HIGH (ref 70–99)
Potassium: 3.9 mmol/L (ref 3.5–5.1)
Sodium: 140 mmol/L (ref 135–145)
Total Bilirubin: 0.7 mg/dL (ref 0.3–1.2)
Total Protein: 4.9 g/dL — ABNORMAL LOW (ref 6.5–8.1)

## 2022-07-04 LAB — CBC WITH DIFFERENTIAL/PLATELET
Abs Immature Granulocytes: 0.05 10*3/uL (ref 0.00–0.07)
Basophils Absolute: 0.1 10*3/uL (ref 0.0–0.1)
Basophils Relative: 1 %
Eosinophils Absolute: 0.1 10*3/uL (ref 0.0–0.5)
Eosinophils Relative: 2 %
HCT: 25 % — ABNORMAL LOW (ref 36.0–46.0)
Hemoglobin: 8.1 g/dL — ABNORMAL LOW (ref 12.0–15.0)
Immature Granulocytes: 1 %
Lymphocytes Relative: 16 %
Lymphs Abs: 1.3 10*3/uL (ref 0.7–4.0)
MCH: 27.4 pg (ref 26.0–34.0)
MCHC: 32.4 g/dL (ref 30.0–36.0)
MCV: 84.5 fL (ref 80.0–100.0)
Monocytes Absolute: 0.8 10*3/uL (ref 0.1–1.0)
Monocytes Relative: 10 %
Neutro Abs: 6 10*3/uL (ref 1.7–7.7)
Neutrophils Relative %: 70 %
Platelets: 186 10*3/uL (ref 150–400)
RBC: 2.96 MIL/uL — ABNORMAL LOW (ref 3.87–5.11)
RDW: 14.2 % (ref 11.5–15.5)
WBC: 8.4 10*3/uL (ref 4.0–10.5)
nRBC: 0 % (ref 0.0–0.2)

## 2022-07-04 MED ORDER — MAGNESIUM HYDROXIDE 400 MG/5ML PO SUSP
30.0000 mL | Freq: Once | ORAL | Status: AC
  Administered 2022-07-04: 30 mL via ORAL
  Filled 2022-07-04: qty 30

## 2022-07-04 NOTE — Progress Notes (Signed)
PROGRESS NOTE   Subjective/Complaints: Pt in bed this AM. Reports she is doing better today compared to yesterday. No additional concerns. Therapy reports earlier she was talking about a women in a suit was was in her room at 230 am.   Review of Systems  Constitutional:  Negative for chills and fever.  Eyes:  Negative for double vision.  Respiratory:  Negative for cough and shortness of breath.   Cardiovascular:  Negative for chest pain.  Gastrointestinal:  Positive for constipation. Negative for abdominal pain and nausea.  Genitourinary: Negative.   Neurological:  Positive for weakness.    Objective:   ECHOCARDIOGRAM COMPLETE  Result Date: 07/02/2022    ECHOCARDIOGRAM REPORT   Patient Name:   Amber Ruiz Date of Exam: 07/02/2022 Medical Rec #:  [id number]       Height:       60.0 in Accession #:    [id number]      Weight:       135.0 lb Date of Birth:  1941/05/05        BSA:          1.579 m Patient Age:    81 years        BP:           108/65 mmHg Patient Gender: F               HR:           75 bpm. Exam Location:  Inpatient Procedure: 2D Echo Indications:    syncope  History:        Patient has no prior history of Echocardiogram examinations.  Sonographer:    Delcie Roch RDCS Referring Phys: [id number] [place]eline General IMPRESSIONS  1. Left ventricular ejection fraction, by estimation, is 60 to 65%. The left ventricle has normal function. The left ventricle has no regional wall motion abnormalities. Left ventricular diastolic parameters are consistent with Grade I diastolic dysfunction (impaired relaxation).  2. Right ventricular systolic function is normal. The right ventricular size is normal. There is normal pulmonary artery systolic pressure.  3. The mitral valve is normal in structure. No evidence of mitral valve regurgitation. No evidence of mitral stenosis.  4. Tricuspid valve regurgitation is moderate, most prominent in Img  series 45. Unable to visualize well in over view; unable to view hepatin vein or IVC.  5. The aortic valve is tricuspid. Aortic valve regurgitation is not visualized. No aortic stenosis is present. Comparison(s): No prior Echocardiogram. FINDINGS  Left Ventricle: Left ventricular ejection fraction, by estimation, is 60 to 65%. The left ventricle has normal function. The left ventricle has no regional wall motion abnormalities. The left ventricular internal cavity size was normal in size. There is  no left ventricular hypertrophy. Left ventricular diastolic parameters are consistent with Grade I diastolic dysfunction (impaired relaxation). Right Ventricle: The right ventricular size is normal. No increase in right ventricular wall thickness. Right ventricular systolic function is normal. There is normal pulmonary artery systolic pressure. The tricuspid regurgitant velocity is 2.74 m/s, and  with an assumed right atrial pressure of 3 mmHg, the estimated right ventricular systolic pressure is 33.0 mmHg. Left Atrium: Left atrial  size was normal in size. Right Atrium: Right atrial size was normal in size. Pericardium: There is no evidence of pericardial effusion. Mitral Valve: The mitral valve is normal in structure. No evidence of mitral valve regurgitation. No evidence of mitral valve stenosis. Tricuspid Valve: The tricuspid valve is normal in structure. Tricuspid valve regurgitation is moderate . No evidence of tricuspid stenosis. Aortic Valve: The aortic valve is tricuspid. Aortic valve regurgitation is not visualized. No aortic stenosis is present. Pulmonic Valve: The pulmonic valve was normal in structure. Pulmonic valve regurgitation is trivial. No evidence of pulmonic stenosis. Aorta: The aortic root, ascending aorta and aortic arch are all structurally normal, with no evidence of dilitation or obstruction. IAS/Shunts: No atrial level shunt detected by color flow Doppler.  LEFT VENTRICLE PLAX 2D LVIDd:          3.80 cm   Diastology LVIDs:         2.20 cm   LV e' medial:    5.98 cm/s LV PW:         0.80 cm   LV E/e' medial:  19.1 LV IVS:        0.70 cm   LV e' lateral:   5.55 cm/s LVOT diam:     1.60 cm   LV E/e' lateral: 20.5 LV SV:         45 LV SV Index:   28 LVOT Area:     2.01 cm  RIGHT VENTRICLE RV S prime:     16.30 cm/s TAPSE (M-mode): 2.2 cm LEFT ATRIUM             Index        RIGHT ATRIUM           Index LA diam:        2.60 cm 1.65 cm/m   RA Area:     10.60 cm LA Vol (A2C):   28.3 ml 17.92 ml/m  RA Volume:   22.10 ml  13.99 ml/m LA Vol (A4C):   42.1 ml 26.65 ml/m LA Biplane Vol: 35.9 ml 22.73 ml/m  AORTIC VALVE LVOT Vmax:   107.00 cm/s LVOT Vmean:  73.700 cm/s LVOT VTI:    0.223 m  AORTA Ao Root diam: 2.80 cm Ao Asc diam:  2.60 cm MITRAL VALVE                TRICUSPID VALVE MV Area (PHT): 4.06 cm     TR Peak grad:   30.0 mmHg MV Decel Time: 187 msec     TR Vmax:        274.00 cm/s MV E velocity: 114.00 cm/s MV A velocity: 150.00 cm/s  SHUNTS MV E/A ratio:  0.76         Systemic VTI:  0.22 m                             Systemic Diam: 1.60 cm Riley Lam MD Electronically signed by Riley Lam MD Signature Date/Time: 07/02/2022/2:58:01 PM    Final    Recent Labs    07/03/22 0215 07/04/22 0541  WBC 12.9* 8.4  HGB 8.8* 8.1*  HCT 28.1* 25.0*  PLT 204 186   Recent Labs    07/03/22 0215 07/04/22 0541  NA 136 140  K 4.5 3.9  CL 104 109  CO2 26 24  GLUCOSE 144* 108*  BUN 14 16  CREATININE 0.89 0.67  CALCIUM 7.9* 8.1*  Intake/Output Summary (Last 24 hours) at 07/04/2022 1127 Last data filed at 07/04/2022 0730 Gross per 24 hour  Intake 120 ml  Output --  Net 120 ml        Physical Exam: Vital Signs Blood pressure (!) 136/46, pulse 79, temperature 98.9 F (37.2 C), temperature source Oral, resp. rate 16, height 5' (1.524 m), weight 66.1 kg, SpO2 98 %.   General: Alert and oriented x 3, No apparent distress HEENT: Head is normocephalic, atraumatic, PERRLA,  EOMI, sclera anicteric, oral mucosa pink and moist Neck: Supple without JVD or lymphadenopathy Heart: Reg rate and rhythm. No murmurs rubs or gallops Chest: CTA bilaterally without wheezes, rales, or rhonchi; no distress Abdomen: Soft, non-tender, non-distended, bowel sounds positive. Extremities: No clubbing, cyanosis, or edema. Pulses are 2+ Psych: Pt's affect is appropriate. Pt is cooperative  Musculoskeletal:        General: Swelling (Right hip) and tenderness present.     Cervical back: Normal range of motion.  Skin:    General: Skin is warm.     Comments: Right hip incision  CDI with foam dressing.   Neurological:     Mental Status: She is alert and oriented to person, place, and time.     Cranial Nerves: CN 2-12 grossly I ntact    Sensory: No sensory deficit.     Coordination: Coordination normal.     Comments: Right hip limited by pain. Otherwise motor 5/5 UE, 3-4+/5 LLE, Right ADF/APF 4/5. No sensory findings. DTR's 1+     Assessment/Plan: 1. Functional deficits which require 3+ hours per day of interdisciplinary therapy in a comprehensive inpatient rehab setting. Physiatrist is providing close team supervision and 24 hour management of active medical problems listed below. Physiatrist and rehab team continue to assess barriers to discharge/monitor patient progress toward functional and medical goals  Care Tool:  Bathing              Bathing assist       Upper Body Dressing/Undressing Upper body dressing        Upper body assist      Lower Body Dressing/Undressing Lower body dressing            Lower body assist       Toileting Toileting    Toileting assist       Transfers Chair/bed transfer  Transfers assist           Locomotion Ambulation   Ambulation assist              Walk 10 feet activity   Assist           Walk 50 feet activity   Assist           Walk 150 feet activity   Assist           Walk  10 feet on uneven surface  activity   Assist           Wheelchair     Assist               Wheelchair 50 feet with 2 turns activity    Assist            Wheelchair 150 feet activity     Assist          Blood pressure (!) 136/46, pulse 79, temperature 98.9 F (37.2 C), temperature source Oral, resp. rate 16, height 5' (1.524 m), weight 66.1 kg, SpO2 98 %. Medical Problem List and  Plan: 1. Functional deficits secondary to a right intertrochanteric hip fx s/p ORIF 07/01/22             -patient may shower starting 8/16 if no drainage from incisions             -ELOS/Goals: 10-14 day, mod I with PT and OT  -Team conference today  -SLP eval 2.  Antithrombotics: -DVT/anticoagulation:  Pharmaceutical: Lovenox             -antiplatelet therapy: N/a 3. Pain Management: Hydrocodone prn.  4. Mood/Behavior/Sleep: LCSW to follow for evaluation and support.              --has been sleeping well.              -antipsychotic agents: N/A 5. Neuropsych/cognition: This patient is capable of making decisions on her own behalf. 6. Skin/Wound Care: Routine pressure relief measures.  7. Fluids/Electrolytes/Nutrition: Monitor I/O. Check Lytes in am.  8. Anxiety d/o: Managed with celexa. 9. ABLA: Recheck CBC in am.   -HGB down to 8.1 8/15, monitor 10. Leucocytosis: wound clean, chest clear. Likely reactive             -Improved to 8.4 8/15 11.  Constipation: Will add senna to colace.   -8/15 order milk of mg    LOS: 1 days A FACE TO FACE EVALUATION WAS PERFORMED  Fanny Dance 07/04/2022, 11:27 AM

## 2022-07-04 NOTE — Discharge Instructions (Addendum)
Inpatient Rehab Discharge Instructions  Amber Ruiz Discharge date and time:  07/21/22  Activities/Precautions/ Functional Status: Activity: no lifting, driving, or strenuous exercise till cleared by MD Diet: regular diet Wound Care: keep wound clean and dry . Contact Dr. Jena Gauss if you develop any problems with your incision/wound--redness, swelling, increase in pain, drainage or if you develop fever or chills.     Functional status:  ___ No restrictions     ___ Walk up steps independently _X__ 24/7 supervision/assistance   ___ Walk up steps with assistance ___ Intermittent supervision/assistance  ___ Bathe/dress independently ___ Walk with walker     _X__ Bathe/dress with assistance ___ Walk Independently    ___ Shower independently __X_ Walk with assistance    ___ Shower with assistance ___ No alcohol     ___ Return to work/school ________   COMMUNITY REFERRALS UPON DISCHARGE:    Home Health:   PT     OT     ST     SNA                    Agency:[facility]     Phone:[phone] *Please expect follow-up within 2-3 days of discharge to schedule your home visit. If you have not received follow-up, be sure to contact the branch directly.*    Medical Equipment/Items Ordered: hospital bed, shower seat with back, and 3in1 bedside commode                                                 Agency/Supplier: [facility] [id number]     Special Instructions:    My questions have been answered and I understand these instructions. I will adhere to these goals and the provided educational materials after my discharge from the hospital.  Patient/Caregiver Signature _______________________________ Date __________  Clinician Signature _______________________________________ Date __________  Please bring this form and your medication list with you to all your follow-up doctor's appointments.       [facility] Discharge Instructions   General Discharge  Instructions  WEIGHT BEARING STATUS:Weightbearing as tolerated right lower extremity  RANGE OF MOTION/ACTIVITY: Ok for unrestricted range of motion of the right hip and knee  Wound Care: Incisions can be left open to air if there is no drainage. If incision continues to have drainage, follow wound care instructions below. Okay to shower if no drainage from incisions.  DVT/PE prophylaxis:  Eliquis 2.5 mg twice daily x 30 days  Diet: as you were eating previously.  Can use over the counter stool softeners and bowel preparations, such as Miralax, to help with bowel movements.  Narcotics can be constipating.  Be sure to drink plenty of fluids  PAIN MEDICATION USE AND EXPECTATIONS  You have likely been given narcotic medications to help control your pain.  After a traumatic event that results in an fracture (broken bone) with or without surgery, it is ok to use narcotic pain medications to help control one's pain.  We understand that everyone responds to pain differently and each individual patient will be evaluated on a regular basis for the continued need for narcotic medications. Ideally, narcotic medication use should last no more than 6-8 weeks (coinciding with fracture healing).   As a patient it is your responsibility as well to monitor narcotic medication use and report the amount and frequency you use these  medications when you come to your office visit.   We would also advise that if you are using narcotic medications, you should take a dose prior to therapy to maximize you participation.  IF YOU ARE ON NARCOTIC MEDICATIONS IT IS NOT PERMISSIBLE TO OPERATE A MOTOR VEHICLE (MOTORCYCLE/CAR/TRUCK/MOPED) OR HEAVY MACHINERY DO NOT MIX NARCOTICS WITH OTHER CNS (CENTRAL NERVOUS SYSTEM) DEPRESSANTS SUCH AS ALCOHOL   STOP SMOKING OR USING NICOTINE PRODUCTS!!!!  As discussed nicotine severely impairs your body's ability to heal surgical and traumatic wounds but also impairs bone healing.  Wounds and  bone heal by forming microscopic blood vessels (angiogenesis) and nicotine is a vasoconstrictor (essentially, shrinks blood vessels).  Therefore, if vasoconstriction occurs to these microscopic blood vessels they essentially disappear and are unable to deliver necessary nutrients to the healing tissue.  This is one modifiable factor that you can do to dramatically increase your chances of healing your injury.    (This means no smoking, no nicotine gum, patches, etc)  DO NOT USE NONSTEROIDAL ANTI-INFLAMMATORY DRUGS (NSAID'S)  Using products such as Advil (ibuprofen), Aleve (naproxen), Motrin (ibuprofen) for additional pain control during fracture healing can delay and/or prevent the healing response.  If you would like to take over the counter (OTC) medication, Tylenol (acetaminophen) is ok.  However, some narcotic medications that are given for pain control contain acetaminophen as well. Therefore, you should not exceed more than 4000 mg of tylenol in a day if you do not have liver disease.  Also note that there are may OTC medicines, such as cold medicines and allergy medicines that my contain tylenol as well.  If you have any questions about medications and/or interactions please ask your doctor/PA or your pharmacist.      ICE AND ELEVATE INJURED/OPERATIVE EXTREMITY  Using ice and elevating the injured extremity above your heart can help with swelling and pain control.  Icing in a pulsatile fashion, such as 20 minutes on and 20 minutes off, can be followed.    Do not place ice directly on skin. Make sure there is a barrier between to skin and the ice pack.    Using frozen items such as frozen peas works well as the conform nicely to the are that needs to be iced.  USE AN ACE WRAP OR TED HOSE FOR SWELLING CONTROL  In addition to icing and elevation, Ace wraps or TED hose are used to help limit and resolve swelling.  It is recommended to use Ace wraps or TED hose until you are informed to stop.    When  using Ace Wraps start the wrapping distally (farthest away from the body) and wrap proximally (closer to the body)   Example: If you had surgery on your leg or thing and you do not have a splint on, start the ace wrap at the toes and work your way up to the thigh        If you had surgery on your upper extremity and do not have a splint on, start the ace wrap at your fingers and work your way up to the upper arm   CALL THE OFFICE FOR MEDICATION REFILL OR WITH ANY QUESTIONS/CONCERNS: [phone]   VISIT OUR WEBSITE FOR ADDITIONAL INFORMATION: [email or web address]    Discharge Wound Care Instructions  Do NOT apply any ointments, solutions or lotions to pin sites or surgical wounds.  These prevent needed drainage and even though solutions like hydrogen peroxide kill bacteria, they also damage cells lining the pin sites that  help fight infection.  Applying lotions or ointments can keep the wounds moist and can cause them to breakdown and open up as well. This can increase the risk for infection. When in doubt call the office.  If any drainage is noted, use Mepilex dressings - These dressing supplies should be available at local medical supply stores [facility], [facility], etc) as well as Insurance claims handler ([facility], [facility], [employer], etc)  Once the incision is completely dry and without drainage, it may be left open to air out.  Showering may begin 36-48 hours later.  Cleaning gently with soap and water.  Information on my medicine - ELIQUIS (apixaban)  This medication education was reviewed with me or my healthcare representative as part of my discharge preparation.    Why was Eliquis prescribed for you? Eliquis was prescribed for you to reduce the risk of blood clots forming after orthopedic surgery.    What do You need to know about Eliquis? Take your Eliquis TWICE DAILY - one tablet in the morning and one tablet in the evening with or without food.  It would be best to take the  dose about the same time each day.  If you have difficulty swallowing the tablet whole please discuss with your pharmacist how to take the medication safely.  Take Eliquis exactly as prescribed by your doctor and DO NOT stop taking Eliquis without talking to the doctor who prescribed the medication.  Stopping without other medication to take the place of Eliquis may increase your risk of developing a clot.  After discharge, you should have regular check-up appointments with your healthcare provider that is prescribing your Eliquis.  What do you do if you miss a dose? If a dose of ELIQUIS is not taken at the scheduled time, take it as soon as possible on the same day and twice-daily administration should be resumed.  The dose should not be doubled to make up for a missed dose.  Do not take more than one tablet of ELIQUIS at the same time.  Important Safety Information A possible side effect of Eliquis is bleeding. You should call your healthcare provider right away if you experience any of the following: Bleeding from an injury or your nose that does not stop. Unusual colored urine (red or dark brown) or unusual colored stools (red or black). Unusual bruising for unknown reasons. A serious fall or if you hit your head (even if there is no bleeding).  Some medicines may interact with Eliquis and might increase your risk of bleeding or clotting while on Eliquis. To help avoid this, consult your healthcare provider or pharmacist prior to using any new prescription or non-prescription medications, including herbals, vitamins, non-steroidal anti-inflammatory drugs (NSAIDs) and supplements.  This website has more information on Eliquis (apixaban): http://www.eliquis.com/eliquis/home

## 2022-07-04 NOTE — Evaluation (Signed)
Physical Therapy Assessment and Plan  Patient Details  Name: Amber Ruiz MRN: [id number] Date of Birth: 1940-12-12  PT Diagnosis: Abnormality of gait, Difficulty walking, and Pain in joint Rehab Potential: Good ELOS: 12-14 days   Today's Date: 07/04/2022 PT Individual Time: 0800-0900 PT Individual Time Calculation (min): 60 min    Hospital Problem: Principal Problem:   Femur fracture, right (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   OCD (obsessive compulsive disorder)    Past Surgical History:  Past Surgical History:  Procedure Laterality Date   ABDOMINAL HYSTERECTOMY     COLONOSCOPY     INTRAMEDULLARY (IM) NAIL INTERTROCHANTERIC Right 07/02/2022   Procedure: INTRAMEDULLARY (IM) NAIL INTERTROCHANTERIC;  Surgeon: Shona Needles, MD;  Location: [place];  Service: Orthopedics;  Laterality: Right;    Assessment & Plan Clinical Impression: Amber Ruiz is an 81 year old female with history of OCD, anxiety, anemia, vertigo in the past; who was admitted after reports of fall onto her right hip the night PTA, she stayed on the floor from 10 pm to 11 am till family found her. She was admitted next day on 07/01/22 for work up. She was found to have right IT femur fracture and underwent ORIF on 08/13 by Dr. Doreatha Martin. Post op WBAT and on Lovenox for DVT prophylaxis. Follow up labs show ABLA which is being monitored. Leucocytosis with rise in WBC to 12.9 being monitored.  PT evaluations done revealing right nystagmus which was treated with modified L Dix Hallpike maneuvers.  OT evaluation completed today revealing pain, decrease in activity tolerance as well as difficulty advancing RLE. CIR recommended due to functional decline. Patient transferred to CIR on 07/03/2022 .   Patient currently requires min with mobility secondary to muscle weakness and decreased standing balance and  decreased balance strategies.  Prior to hospitalization, patient was independent  with mobility and lived with Alone in a House home.  Home access is 4-6 (4 in garage without rails; 6 in front with R&L)Stairs to enter.  Patient will benefit from skilled PT intervention to maximize safe functional mobility, minimize fall risk, and decrease caregiver burden for planned discharge home with 24 hour supervision.  Anticipate patient will benefit from follow up [place] at discharge.  PT - End of Session Activity Tolerance: Tolerates 30+ min activity with multiple rests Endurance Deficit: Yes Endurance Deficit Description: limited by pain PT Assessment Rehab Potential (ACUTE/IP ONLY): Good PT Barriers to Discharge: Home environment access/layout;Inaccessible home environment;Wound Care PT Patient demonstrates impairments in the following area(s): Balance;Pain;Safety;Endurance;Motor;Skin Integrity PT Transfers Functional Problem(s): Bed Mobility;Bed to Chair;Car PT Locomotion Functional Problem(s): Ambulation;Wheelchair Mobility;Stairs PT Plan PT Intensity: Minimum of 1-2 x/day ,45 to 90 minutes PT Frequency: 5 out of 7 days PT Duration Estimated Length of Stay: 12-14 days PT Treatment/Interventions: Ambulation/gait training;Cognitive remediation/compensation;Discharge planning;DME/adaptive equipment instruction;Functional mobility training;Pain management;Psychosocial support;Splinting/orthotics;Therapeutic Activities;UE/LE Strength taining/ROM;Visual/perceptual remediation/compensation;Wheelchair Biochemist, clinical;Therapeutic Exercise;Skin care/wound management;Patient/family education;Neuromuscular re-education;Functional electrical stimulation;Disease management/prevention;Community reintegration;Balance/vestibular training PT Transfers Anticipated Outcome(s): supervision with LRAD PT Locomotion Anticipated Outcome(s): supervision with LRAD PT  Recommendation Recommendations for Other Services: Speech consult;Vestibular eval Follow Up Recommendations: Home health PT Patient destination: Home Equipment Recommended: Rolling walker with 5" wheels   PT Evaluation Precautions/Restrictions Precautions Precautions: Fall;Other (comment) Precaution Comments: watch BP and SpO2; vertigo Restrictions Weight Bearing Restrictions: Yes RLE Weight Bearing: Weight bearing as tolerated General  Vital Signs Pain   Pain Interference Pain Interference Pain Effect on Sleep: 1. Rarely or not at all Pain Interference with Therapy Activities: 2. Occasionally Pain Interference with Day-to-Day Activities: 2. Occasionally Home Living/Prior Functioning Home Living Available Help at Discharge:  (Planning to stay with son, bed and bath on the main level but house on a hill and she had trouble getting into the house prior to fall) Prior Function Level of Independence: Independent with gait;Independent with transfers  Able to Take Stairs?: Yes Driving: Yes Vocation: Retired Vision/Perception  Vision - History Ability to See in Adequate Light: 0 Adequate Perception Perception: Within Functional Limits Praxis Praxis: Intact  Cognition Overall Cognitive Status: Within Functional Limits for tasks assessed Arousal/Alertness: Awake/alert Orientation Level: Oriented X4 Year: 2023 Month: August Memory: Appears intact Behaviors: Perseveration (Pt perseverated on seeing a woman in her room the previous night despite redirection) Safety/Judgment: Appears intact Sensation Sensation Light Touch: Appears Intact Hot/Cold: Not tested Proprioception: Not tested Stereognosis: Not tested Coordination Gross Motor Movements are Fluid and Coordinated: No Coordination and Movement Description: limited d/t pain and poor initiation Motor  Motor Motor: Within Functional Limits Motor - Skilled Clinical Observations: limited by pain only   Trunk/Postural  Assessment  Cervical Assessment Cervical Assessment: Within Functional Limits Thoracic Assessment Thoracic Assessment: Within Functional Limits Lumbar Assessment Lumbar Assessment: Within Functional Limits Postural Control Postural Control: Deficits on evaluation (delayed and inadequate d/t pain)  Balance Static Sitting Balance Static Sitting - Balance Support: Feet supported Static Sitting - Level of Assistance: 5: Stand by assistance Dynamic Sitting Balance Dynamic Sitting - Balance Support: Feet supported Dynamic Sitting - Level of Assistance: 5: Stand by assistance Static Standing Balance Static Standing - Balance Support: During functional activity Static Standing - Level of Assistance: 4: Min assist Dynamic Standing Balance Dynamic Standing - Balance Support: During functional activity Dynamic Standing - Level of Assistance: 4: Min assist Extremity Assessment      RLE Assessment RLE Assessment: Exceptions to [facility] General Strength Comments: Hip not tested d/t pain, ankle and knee WFL LLE Assessment LLE Assessment: Within Functional Limits General Strength Comments: [facility]  Care Tool Care Tool Bed Mobility Roll left and right activity   Roll left and right assist level: Moderate Assistance - Patient 50 - 74%    Sit to lying activity   Sit to lying assist level: Maximal Assistance - Patient 25 - 49%    Lying to sitting on side of bed activity   Lying to sitting on side of bed assist level: the ability to move from lying on the back to sitting on the side of the bed with no back support.: Maximal Assistance - Patient 25 - 49%     Care Tool Transfers Sit to stand transfer   Sit to stand assist level: Contact Guard/Touching assist    Chair/bed transfer   Chair/bed transfer assist level: Contact Guard/Touching assist     Toilet transfer   Assist Level: Contact Guard/Touching assist    Car transfer Car transfer activity did not occur: Safety/medical concerns         Care Tool Locomotion Ambulation Ambulation activity did not occur: Safety/medical concerns        Walk 10 feet activity Walk 10 feet activity did not occur: Safety/medical concerns       Walk 50 feet with 2 turns activity Walk 50 feet with 2 turns activity did not occur: Safety/medical concerns      Walk 150 feet activity Walk 150 feet activity did  not occur: Safety/medical concerns      Walk 10 feet on uneven surfaces activity Walk 10 feet on uneven surfaces activity did not occur: Safety/medical concerns      Stairs Stair activity did not occur: Safety/medical concerns        Walk up/down 1 step activity Walk up/down 1 step or curb (drop down) activity did not occur: Safety/medical concerns      Walk up/down 4 steps activity Walk up/down 4 steps activity did not occur: Safety/medical concerns      Walk up/down 12 steps activity Walk up/down 12 steps activity did not occur: Safety/medical concerns      Pick up small objects from floor Pick up small object from the floor (from standing position) activity did not occur: Safety/medical concerns      Wheelchair Is the patient using a wheelchair?: Yes Type of Wheelchair: Manual Wheelchair activity did not occur: Safety/medical concerns      Wheel 50 feet with 2 turns activity Wheelchair 50 feet with 2 turns activity did not occur: Safety/medical concerns    Wheel 150 feet activity Wheelchair 150 feet activity did not occur: Safety/medical concerns      Refer to Care Plan for Long Term Goals  SHORT TERM GOAL WEEK 1 PT Short Term Goal 1 (Week 1): Pt will ambulate >25 ft with LRAD PT Short Term Goal 2 (Week 1): Pt will initiate stair training PT Short Term Goal 3 (Week 1): Pt will perform bed mobility with mod A or better  Recommendations for other services: None   Skilled Therapeutic Intervention Evaluation completed (see details above) with patient education regarding purpose of PT evaluation, PT POC and goals,  therapy schedule, weekly team meetings, and other CIR information including safety plan and fall risk safety. Ortho PA in/out during session. At this time, pt noted to be perseverating on seeing a woman in a "skirt suit" that was in her room and stated she was the "head of this whole place." Pt also states she heard voices outside her room but when she called out no one responded. Pt continues to perseverate even when redirected, leading to limited evaluation.  Pt performed the below functional mobility tasks with the specified levels of skilled cuing and assistance. Pt performed transfer to bedside commode with CGA and had continent urine void with min A to manage gown for hygiene. Documented in flow sheet. Pt returned to bed after session and was left with all needs in reach and alarm active.    Mobility Bed Mobility Bed Mobility: Sit to Supine;Supine to Sit Supine to Sit: Maximal Assistance - Patient - Patient 25-49% Sit to Supine: Maximal Assistance - Patient 25-49% Transfers Transfers: Sit to Stand;Stand Pivot Transfers Sit to Stand: Contact Guard/Touching assist Stand Pivot Transfers: Contact Guard/Touching assist Transfer (Assistive device): Rolling walker Locomotion  Gait Ambulation: No Gait Gait: No Stairs / Additional Locomotion Stairs: No Wheelchair Mobility Wheelchair Mobility: No   Discharge Criteria: Patient will be discharged from PT if patient refuses treatment 3 consecutive times without medical reason, if treatment goals not met, if there is a change in medical status, if patient makes no progress towards goals or if patient is discharged from hospital.  The above assessment, treatment plan, treatment alternatives and goals were discussed and mutually agreed upon: by patient  Mickel Fuchs 07/04/2022, 12:24 PM

## 2022-07-04 NOTE — Progress Notes (Signed)
Inpatient Rehabilitation  Patient information reviewed and entered into eRehab system by Kambrey Hagger M. Sherilynn Dieu, M.A., CCC/SLP, PPS Coordinator.  Information including medical coding, functional ability and quality indicators will be reviewed and updated through discharge.    

## 2022-07-04 NOTE — Progress Notes (Signed)
Physical Therapy Session Note  Patient Details  Name: Amber Ruiz MRN: [id number] Date of Birth: [birth year]  Today's Date: 07/04/2022 PT Individual Time: 1345-1445 PT Individual Time Calculation (min): 60 min   Short Term Goals: Week 1:  PT Short Term Goal 1 (Week 1): Pt will ambulate >25 ft with LRAD PT Short Term Goal 2 (Week 1): Pt will initiate stair training PT Short Term Goal 3 (Week 1): Pt will perform bed mobility with mod A or better  Skilled Therapeutic Interventions/Progress Updates:  Pt seated in w/c on arrival and agreeable to therapy. Pt reports extreme levels of pain with mobility but cannot recall if she has had pain medication. Pt asks if anyone has ever fainted or thrown up from the pain d/t it's intensity. Therapist provided encouragement and rest/positioning for pain. Also provided with ice pack at end of session.   Pt propelled w/c with BUE x 100 ft with min A for steering d/t to pull to R side. Pt requires cueing at times to recall brakes and location of pushrims.   Pt performed car transfer with mod A overall for BLE management and cueing throughout. Pt prefers to stand on toes to minimize hip motion but was able to stand flat footed and bend hip/knee for improved foot clearance. Pt demoed poor carryover of instructions from earlier in the day. Pt required extended rest break prior to attempting transfer back with same level of assist.   Pt ambulated x 5 ft with min A and max encouragement. Requires cues for gait mechanics, safety with RW, and using UE to off load RLE. Pt fatigued quickly and could not tolerate continued weight bearing. Pt returned to room and was left with all needs in reach and alarm active.    Therapy Documentation Precautions:  Precautions Precautions: Fall, Other (comment) Precaution Comments: watch BP and SpO2; vertigo Restrictions Weight Bearing Restrictions: Yes RLE Weight Bearing: Weight bearing as tolerated General:       Therapy/Group: Individual Therapy  [name] 07/04/2022, 3:43 PM

## 2022-07-04 NOTE — Progress Notes (Signed)
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 She has never used smokeless tobacco. She reports that she does not drink alcohol. No history on file for drug use.  Family / Support Systems Marital Status: Widow/Widower How Long?: 5 years Patient Roles: Parent Spouse/Significant Other: Widowed Children: Two children- Amber Ruiz (son) [phone]; Amber Ruiz (dtr) [phone] Other Supports: None reported Anticipated Caregiver: son Amber Ruiz Ability/Limitations of Caregiver: None reported. Pt plan is for her to discharge to home Caregiver Availability: 24/7 Family Dynamics: Pt lives alone.  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 Education: college Dentist - How often do you need to have someone help you when you read instructions, pamphlets, or other written material from your doctor or pharmacy?: Never Writes: Yes Employment Status: Retired Age Retired: 65 Marine scientist Issues: Denies Guardian/Conservator: N/A   Abuse/Neglect Abuse/Neglect Assessment Can Be  Completed: Yes Physical Abuse: Denies Verbal Abuse: Denies Sexual Abuse: Denies Exploitation of patient/patient's resources: Denies Self-Neglect: Denies  Patient response to: Social Isolation - How often do you feel lonely or isolated from those around you?: Never  Emotional Status Pt's affect, behavior and adjustment status: Pt in good spirits at time of visit. pt is optimistic about her recovery. She would like to be able to return home if possible. Reports she has neighbors that may be able to help aside from her son. Recent Psychosocial Issues: PT reports she continues to struggle with the loss of her husband after 5 yrs. However, continues to find pleasure in other activities such as spending time with her grandson, and maintaining her independence. Psychiatric History: Reports depression since husband passing Substance Abuse History: Denies  Patient / Family Perceptions, Expectations & Goals Pt/Family understanding of illness & functional limitations: Pt and family have a general understanding of pt careneeds Premorbid pt/family roles/activities: Independent Anticipated changes in roles/activities/participation: Assistance with ADLs/IADLs Pt/family expectations/goals: Pt goal is to work on maneuvering herself  to be self sufficient, and be able to drive her car (only drives to grocery store).  Community Resources [employer]: None Premorbid Home Care/DME Agencies: None Transportation available at discharge: TBD Is the patient able to respond to transportation needs?: Yes In the past 12 months, has lack of transportation kept you from medical appointments or from getting medications?: No In the past 12 months, has lack of transportation kept you from meetings, work, or from getting things needed for daily living?: No Resource referrals recommended: Neuropsychology  Discharge Planning Living Arrangements: Alone Support Systems: Children Type of Residence: Private  residence Insurance Resources: [employer], Media planner (specify) (BCBS supp) Financial Resources: Restaurant manager, fast food Screen Referred: No Living Expenses: Own Money  Management: Family Does the patient have any problems obtaining your medications?: No Home Management: Pt reports that she prepares some light meals, and her neighbors bring her food at times. She also reports shes eats out as well. PT reports she has a cleaning lady that comes every other week and will also help her with running errands if needed. Patient/Family Preliminary Plans: TBD Care Coordinator Barriers to Discharge: Decreased caregiver support, Lack of/limited family support Care Coordinator Anticipated Follow Up Needs: HH/OP Expected length of stay: D/c 8/29  Clinical Impression Pt is not a veteran. No HCPOA. DME: cane. Two walking sticks, access to RW and has a bed rail in her closet.   Amber Ruiz A Amber Ruiz 07/04/2022, 3:31 PM

## 2022-07-04 NOTE — Progress Notes (Signed)
Inpatient Rehabilitation Admission Medication Review by a Pharmacist  A complete drug regimen review was completed for this patient to identify any potential clinically significant medication issues.  High Risk Drug Classes Is patient taking? Indication by Medication  Antipsychotic Yes Prn Compazine - N/V  Anticoagulant Yes Lovenox - VTE ppx  Antibiotic No   Opioid Yes Prn Vicodin - pain  Antiplatelet No   Hypoglycemics/insulin No   Vasoactive Medication No   Chemotherapy No   Other Yes Citalopram - mood Pantoprazole - reflux      Type of Medication Issue Identified Description of Issue Recommendation(s)  Drug Interaction(s) (clinically significant)     Duplicate Therapy     Allergy     No Medication Administration End Date     Incorrect Dose     Additional Drug Therapy Needed     Significant med changes from prior encounter (inform family/care partners about these prior to discharge). Alendronate -osteoporosis   To resume post rehab stay if appropriate  Other       Clinically significant medication issues were identified that warrant physician communication and completion of prescribed/recommended actions by midnight of the next day:  No  Pharmacist comments: None  Time spent performing this drug regimen review (minutes):  20 minutes  Amber Ruiz 07/04/2022 7:32 AM

## 2022-07-04 NOTE — Progress Notes (Signed)
Orthopaedic Trauma Progress Note  SUBJECTIVE: Doing okay this AM.  Pain in the right leg is well controlled. No chest pain. No SOB. No nausea/vomiting. No other complaints.   Patient states that she was very disoriented last night and this morning as she was quickly taken from the acute care side to the inpatient rehab side of the hospital yesterday and did not feel she had appropriate introduction to the unit. She is adamant that a woman in a white suit came into her room at 2:30 this morning but the patient is not clear what the purpose of this visit was.   OBJECTIVE:  Vitals:   07/03/22 2041 07/04/22 0350  BP: (!) 126/92 (!) 136/46  Pulse: 84 79  Resp: 17 16  Temp: 98.3 F (36.8 C) 98.9 F (37.2 C)  SpO2: 96% 98%    General: Sitting up in bed, no acute distress. Answering questions appropriate. Respiratory: No increased work of breathing.  Right lower extremity: Dressings clean, dry, intact.  Able to get full knee extension. Ankle DF/PF intact.  Endorses sensation throughout extremity.  Neurovascularly intact.  IMAGING: Stable post op imaging.   LABS:  Results for orders placed or performed during the hospital encounter of 07/03/22 (from the past 24 hour(s))  Comprehensive metabolic panel     Status: Abnormal   Collection Time: 07/04/22  5:41 AM  Result Value Ref Range   Sodium 140 135 - 145 mmol/L   Potassium 3.9 3.5 - 5.1 mmol/L   Chloride 109 98 - 111 mmol/L   CO2 24 22 - 32 mmol/L   Glucose, Bld 108 (H) 70 - 99 mg/dL   BUN 16 8 - 23 mg/dL   Creatinine, Ser 3.87 0.44 - 1.00 mg/dL   Calcium 8.1 (L) 8.9 - 10.3 mg/dL   Total Protein 4.9 (L) 6.5 - 8.1 g/dL   Albumin 2.5 (L) 3.5 - 5.0 g/dL   AST 18 15 - 41 U/L   ALT 7 0 - 44 U/L   Alkaline Phosphatase 44 38 - 126 U/L   Total Bilirubin 0.7 0.3 - 1.2 mg/dL   GFR, Estimated >56 >43 mL/min   Anion gap 7 5 - 15  CBC with Differential/Platelet     Status: Abnormal   Collection Time: 07/04/22  5:41 AM  Result Value Ref Range    WBC 8.4 4.0 - 10.5 K/uL   RBC 2.96 (L) 3.87 - 5.11 MIL/uL   Hemoglobin 8.1 (L) 12.0 - 15.0 g/dL   HCT 32.9 (L) 51.8 - 84.1 %   MCV 84.5 80.0 - 100.0 fL   MCH 27.4 26.0 - 34.0 pg   MCHC 32.4 30.0 - 36.0 g/dL   RDW 66.0 63.0 - 16.0 %   Platelets 186 150 - 400 K/uL   nRBC 0.0 0.0 - 0.2 %   Neutrophils Relative % 70 %   Neutro Abs 6.0 1.7 - 7.7 K/uL   Lymphocytes Relative 16 %   Lymphs Abs 1.3 0.7 - 4.0 K/uL   Monocytes Relative 10 %   Monocytes Absolute 0.8 0.1 - 1.0 K/uL   Eosinophils Relative 2 %   Eosinophils Absolute 0.1 0.0 - 0.5 K/uL   Basophils Relative 1 %   Basophils Absolute 0.1 0.0 - 0.1 K/uL   Immature Granulocytes 1 %   Abs Immature Granulocytes 0.05 0.00 - 0.07 K/uL    ASSESSMENT: Yeraldy Spike is a 81 y.o. female s/p INTRAMEDULLARY  NAIL RIGHT INTERTROCHANTERIC FEMUR FRACTURE 07/02/22   CV/Blood loss: Acute blood  loss anemia, Hgb 8.1 this morning. Hemodynamically stable  PLAN: Weightbearing: WBAT RLE ROM: Okay for unrestricted hip and knee motion as tolerated Incisional and dressing care: Change dressing PRN. OK to leave incisions open to air Showering: Okay to begin showering/getting incisions wet 07/05/2022 if no drainage from incisions Orthopedic device(s): None  Pain management: continue current regimen VTE prophylaxis: Lovenox, SCDs ID:  Ancef 2gm post op completed Foley/Lines:  No foley, KVO IVFs Impediments to Fracture Healing: Vitamin D level 19, start on D supplementation. Dispo: Care per CIR. Will plan for repeat imaging right hip in 2 weeks if patient still in rehab. From ortho standpoint, patient ok to transition to DOAC and continue this for a total of 30 days post-op. Recommend continuing Vit D supplementation x8 weeks  Follow - up plan: 2 weeks after discharge for wound check and repeat x-rays   Contact information:  Truitt Merle MD, Thyra Breed PA-C. After hours and holidays please check [email or web address] for group call information for Sports [facility]  Group   Thompson Caul, PA-C [phone] (office) [email or web address]

## 2022-07-04 NOTE — Evaluation (Addendum)
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   OCD (obsessive compulsive disorder)    Past Surgical History:  Past Surgical History:  Procedure Laterality Date   ABDOMINAL HYSTERECTOMY     COLONOSCOPY     INTRAMEDULLARY (IM) NAIL INTERTROCHANTERIC Right 07/02/2022   Procedure: INTRAMEDULLARY (IM) NAIL INTERTROCHANTERIC;  Surgeon: Shona Needles, MD;  Location: [place];  Service: Orthopedics;  Laterality: Right;    Assessment & Plan Clinical Impression: Amber Ruiz is an 81 year old female with history of OCD, anxiety, anemia, vertigo in the past; who was admitted after reports of fall onto her right hip the night PTA, she stayed on the floor from 10 pm to 11 am till family found her. She was admitted next day on 07/01/22 for work up. She was found to have right IT femur fracture and underwent ORIF on 08/13 by Dr. Doreatha Martin. Post op WBAT and on Lovenox for DVT prophylaxis. Follow up labs show ABLA which is being monitored. Leucocytosis with rise in WBC to 12.9 being monitored.  PT evaluations done revealing right nystagmus which was treated with modified L Dix Hallpike maneuvers.  OT evaluation completed today revealing pain, decrease in activity tolerance as well as difficulty advancing RLE. CIR recommended due to functional decline. Patient transferred to CIR on 07/03/2022 .     Patient currently requires max with basic  self-care skills and IADL secondary to muscle weakness and muscle joint tightness, impaired timing and sequencing, decreased safety awareness, and decreased standing balance and decreased balance strategies.  Prior to hospitalization, patient could complete all ADL's and IADL's including gardening and driving  with independent .  Patient will benefit from skilled intervention to decrease level of assist with basic self-care skills, increase independence with basic self-care skills, and increase level of independence with iADL prior to discharge home with care partner.  Anticipate patient will require 24 hour supervision and follow up home health.  OT - End of Session Activity Tolerance: Tolerates 10 - 20 min activity with multiple rests Endurance Deficit: Yes Endurance Deficit Description: limited by pain OT Assessment Rehab Potential (ACUTE ONLY): Good OT Barriers to Discharge: Inaccessible home environment;Decreased caregiver support;Home environment access/layout;Lack of/limited family support OT Barriers to Discharge Comments: family may not be able to provide S, steps to enter, 2 story home OT Patient demonstrates impairments in the following area(s): Balance;Motor;Behavior;Skin Integrity;Cognition;Pain;Edema;Endurance;Safety OT Basic ADL's Functional Problem(s): Grooming;Bathing;Dressing;Toileting OT Advanced ADL's Functional Problem(s): Simple Meal Preparation;Light Housekeeping;Laundry OT Transfers Functional Problem(s): Toilet;Tub/Shower OT Plan OT Intensity: Minimum of 1-2 x/day, 45 to 90 minutes OT Frequency: 5 out of 7 days OT Duration/Estimated Length of Stay: 12-14 days OT Treatment/Interventions: Balance/vestibular training;Community reintegration;Disease mangement/prevention;Patient/family education;Self Care/advanced ADL retraining;Therapeutic Exercise;Cognitive remediation/compensation;Discharge planning;DME/adaptive equipment instruction;Functional mobility training;Pain  management;Psychosocial support;Skin care/wound managment;Therapeutic Activities;UE/LE Strength taining/ROM OT Self Feeding Anticipated Outcome(s): Indep OT Basic Self-Care Anticipated Outcome(s): S/mod I OT Toileting Anticipated Outcome(s): S OT Bathroom Transfers Anticipated Outcome(s): S  OT Recommendation Recommendations for Other Services: Speech consult;Therapeutic Recreation consult Therapeutic Recreation Interventions: Stress management;Outing/community reintergration Patient destination: Home Follow Up Recommendations: Home health OT Equipment Recommended: 3 in 1 bedside comode;Rolling walker with 5" wheels;Tub/shower seat Equipment Details: 3 in 1, RW, shower seat   OT Evaluation Precautions/Restrictions  Precautions Precautions: Fall;Other (comment) Precaution Comments: watch BP and SpO2; vertigo Restrictions Weight Bearing Restrictions: Yes RLE Weight Bearing: Weight bearing as tolerated General Chart Reviewed: Yes Vital Signs  Pain Pain Assessment Pain Scale: 0-10 Pain Score: 4  Pain Type: Surgical pain Pain Location: Hip Pain Orientation: Right Pain Descriptors / Indicators: Aching Pain Onset: On-going Patients Stated Pain Goal: 0 Pain Intervention(s): Relaxation;Pain med given for lower pain score than stated, per patient request;Repositioned Multiple Pain Sites: No Home Living/Prior Functioning Home Living Living Arrangements: Alone Available Help at Discharge: Family Type of Home: House Home Access: Stairs to enter [employer] of Steps: 4-6 (4 in garage without rails; 6 in front with R&L) Entrance Stairs-Rails: Right, Left, Can reach both Home Layout: Two level, 1/2 bath on main level Alternate Level Stairs-Number of Steps: 12-13 Alternate Level Stairs-Rails: Right Bathroom Shower/Tub: Gaffer, Chiropodist: Standard Bathroom Accessibility: Yes Additional Comments: bed rails attached to bed  Lives With:  Alone IADL History Homemaking Responsibilities: Yes Meal Prep Responsibility: Primary Laundry Responsibility: Primary Cleaning Responsibility: Primary Bill Paying/Finance Responsibility: Primary Shopping Responsibility: Primary Current License: Yes Mode of Transportation: Car Occupation: Retired Type of Occupation: Pharmacist, hospital Leisure and Hobbies: gardening and reading Prior Function Level of Independence: Independent with gait, Independent with transfers  Able to Take Stairs?: Yes Driving: Yes Vocation: Retired Surveyor, mining Baseline Vision/History: 1 Wears glasses Ability to See in Adequate Light: 0 Adequate Patient Visual Report: No change from baseline Vision Assessment?: No apparent visual deficits Perception  Perception: Within Functional Limits Praxis Praxis: Intact Cognition Cognition Overall Cognitive Status:Within Functional Limits for tasks assessed Arousal/Alertness: Awake/alert Orientation Level: Person;Place;Situation Person: Oriented Place: Oriented Situation: Oriented Memory: Appears intact Attention: Sustained Sustained Attention: Appears intact Awareness: Appears intact Problem Solving: Appears intact Behaviors: Perseveration Brief Interview for Mental Status (BIMS) Repetition of Three Words (First Attempt): 3 Temporal Orientation: Year: Correct Temporal Orientation: Month: Accurate within 5 days Temporal Orientation: Day: Correct Recall: "Sock": Yes, no cue required Recall: "Blue": Yes, no cue required Recall: "Bed": Yes, no cue required BIMS Summary Score: 15 Sensation Sensation Hot/Cold: Not tested;Appears Intact Proprioception: Appears Intact Stereognosis: Appears Intact Coordination Gross Motor Movements are Fluid and Coordinated: No Fine Motor Movements are Fluid and Coordinated: Yes Coordination and Movement Description: limited d/t pain and poor initiation Finger Nose Finger Test: wfl Motor  Motor Motor: Within Functional Limits Motor -  Skilled Clinical Observations: limited by pain only  Trunk/Postural Assessment  Cervical Assessment Cervical Assessment: Within Functional Limits Thoracic Assessment Thoracic Assessment: Within Functional Limits Lumbar Assessment Lumbar Assessment: Within Functional Limits Postural Control Postural Control: Deficits on evaluation  Balance Balance Balance Assessed: Yes Static Sitting Balance Static Sitting - Balance Support: Feet supported Static Sitting - Level of Assistance: 5: Stand by assistance Dynamic Sitting Balance Dynamic Sitting - Balance Support: Feet supported Dynamic Sitting - Level of Assistance: 5: Stand by assistance Sitting balance - Comments: minA for static sitting balance Static Standing Balance Static Standing - Balance Support: During functional activity Static Standing - Level of Assistance: 4: Min assist Dynamic Standing Balance Dynamic Standing - Balance Support: During functional activity Dynamic Standing - Level of Assistance: 4: Min assist Extremity/Trunk Assessment RUE Assessment RUE Assessment:  Within Functional Limits LUE Assessment LUE Assessment: Within Functional Limits  Care Tool Care Tool Self Care Eating   Eating Assist Level: Supervision/Verbal cueing    Oral Care    Oral Care Assist Level: Supervision/Verbal cueing    Bathing   Body parts bathed by patient: Right arm;Left arm;Chest;Abdomen;Right upper leg;Left upper leg;Face Body parts bathed by helper: Front perineal area;Buttocks;Right lower leg;Left lower leg   Assist Level: Maximal Assistance - Patient 24 - 49%    Upper Body Dressing(including orthotics)   What is the patient wearing?: Pull over shirt   Assist Level: Moderate Assistance - Patient 50 - 74%    Lower Body Dressing (excluding footwear)   What is the patient wearing?: Pants Assist for lower body dressing: Maximal Assistance - Patient 25 - 49%    Putting on/Taking off footwear   What is the patient wearing?:  Non-skid slipper socks Assist for footwear: Maximal Assistance - Patient 25 - 49%       Care Tool Toileting Toileting activity   Assist for toileting: Maximal Assistance - Patient 25 - 49%     Care Tool Bed Mobility Roll left and right activity   Roll left and right assist level: Moderate Assistance - Patient 50 - 74%    Sit to lying activity   Sit to lying assist level: Maximal Assistance - Patient 25 - 49%    Lying to sitting on side of bed activity   Lying to sitting on side of bed assist level: the ability to move from lying on the back to sitting on the side of the bed with no back support.: Maximal Assistance - Patient 25 - 49%     Care Tool Transfers Sit to stand transfer   Sit to stand assist level: Contact Guard/Touching assist    Chair/bed transfer   Chair/bed transfer assist level: Contact Guard/Touching assist     Toilet transfer   Assist Level: Contact Guard/Touching assist     Care Tool Cognition  Expression of Ideas and Wants Expression of Ideas and Wants: 4. Without difficulty (complex and basic) - expresses complex messages without difficulty and with speech that is clear and easy to understand  Understanding Verbal and Non-Verbal Content Understanding Verbal and Non-Verbal Content: 4. Understands (complex and basic) - clear comprehension without cues or repetitions   Memory/Recall Ability Memory/Recall Ability : Current season;Location of own room;That he or she is in a hospital/hospital unit but unable to recall details of admission and past 24 hrs accurately   Refer to Care Plan for Long Term Goals  SHORT TERM GOAL WEEK 1 OT Short Term Goal 1 (Week 1): Pt will perform all UB self care with close S OT Short Term Goal 2 (Week 1): Pt will perform LB bathing and dressing with min a with AE/DME OT Short Term Goal 3 (Week 1): Pt wll transfer to shower and toilet with DME with min A OT Short Term Goal 4 (Week 1): Pt will stand for grooming sink side with CGA  up to 5 min  Recommendations for other services: Therapeutic Recreation  Stress management and Outing/community reintegration and Other: Speech therapy     Skilled Therapeutic Intervention ADL ADL Eating: Set up Where Assessed-Eating: Bed level Grooming: Minimal assistance Where Assessed-Grooming: Standing at sink Upper Body Bathing: Moderate assistance Where Assessed-Upper Body Bathing: Sitting at sink Lower Body Bathing: Maximal assistance Where Assessed-Lower Body Bathing: Sitting at sink Upper Body Dressing: Moderate assistance Where Assessed-Upper Body Dressing: Sitting at sink Lower Body  Dressing: Maximal assistance Where Assessed-Lower Body Dressing: Sitting at sink;Standing at sink Toileting: Moderate assistance Where Assessed-Toileting: Bedside Commode Toilet Transfer: Moderate assistance Toilet Transfer Method: Stand pivot Toilet Transfer Equipment: Radiographer, therapeutic: Moderate assistance Tub/Shower Transfer Method: Stand pivot Tub/Shower Equipment: Facilities manager: Moderate assistance Social research officer, government Method: Radiographer, therapeutic: Shower seat with back ADL Comments: very slow to transition, pain and distractibility limits performance Mobility  Bed Mobility Bed Mobility: Sit to Supine;Supine to Sit Supine to Sit: Maximal Assistance - Patient - Patient 25-49% Sit to Supine: Maximal Assistance - Patient 25-49% Transfers Sit to Stand: Contact Guard/Touching assist Pt seen for full initial OT evaluation and training session this am. Pt in bed reporting she had an unusual experience last night and was hearing a lot of voices in the hallway upon OT arrival for session and reported fear others were able to come in on unit. OT reassured only staff are permitted on unit in the pm hours and then no issues with full participation in all activity presented. OT introduced role of therapy and purpose of session.  Pt  presented with slower processing, extremely talkative and decreased insight.  OT adding in ST for eval. Pain limited function. Assessed UE, vision, sensation, cognition bed level and throughout session. See functional status for self care, functional mobility and transfers using RW.  OT recommending rehab stay to maximize safety and independence with rec for HHOT, 3 in 1, RW, and shower seat for d/c if going home or to son's based on cg ability. Pt left at end of session with chair alarm set up in w/c with tray table with needs and nurse call bell within reach.   Discharge Criteria: Patient will be discharged from OT if patient refuses treatment 3 consecutive times without medical reason, if treatment goals not met, if there is a change in medical status, if patient makes no progress towards goals or if patient is discharged from hospital.  The above assessment, treatment plan, treatment alternatives and goals were discussed and mutually agreed upon: by patient  Barnabas Lister 07/04/2022, 11:22 PM

## 2022-07-04 NOTE — Patient Care Conference (Addendum)
Inpatient RehabilitationTeam Conference and Plan of Care Update Date: 07/04/22   Time: 11:19 AM   Patient Name: Amber Ruiz      Medical Record Number: [id number]  Date of Birth: [birth year] Sex: Female         Room/Bed: 4M11C/4M11C-01 Payor Info: Payor: MEDICARE / Plan: MEDICARE PART A AND B / Product Type: *No Product type* /    Admit Date/Time:  07/03/2022  5:49 PM  Primary Diagnosis:  Femur fracture, right [facility])  Hospital Problems: Principal Problem:   Femur fracture, right [facility])    Expected Discharge Date: Expected Discharge Date: 07/18/22  Team Members Present: Physician leading conference: Dr. Fanny Dance Social Worker Present: Cecile Sheerer, LCSWA Nurse Present: Other (comment) Vedia Pereyra, RN) PT Present: Bernie Covey, PT OT Present: Valetta Fuller, OT PPS Coordinator present : Fae Pippin, SLP     Current Status/Progress Goal Weekly Team Focus  Bowel/Bladder   Patient is continent of Bowel and Bladder  Remain continent of Bowel and Bladder  Toileting Q shift and PRN   Swallow/Nutrition/ Hydration             ADL's   min-mod A UB, max A LB, 3 in 1 transfers min A with RW with increased time  S  adding ST, progress BADL's and functional mobility, shower   Mobility   CGA overall, limited by pain and slow initiation  supervision  pain mangement, gait, transfers, stairs   Communication             Safety/Cognition/ Behavioral Observations            Pain   patient has pain in left leg 3/10  pt states pain of 0/10  Pain assessment q shift and PRN   Skin     Incision to right hip with hydrocolloid dressing    For incision to remain CDI   Assess every shift and and prn for changes to incision    Discharge Planning:  To be assessed. Per EMR, pt to d/c to home with intermittent support from children. SW will confirm no barriers to discharge.   Team Discussion: Right femur fracture. Patient continent of urine. No BM recorded. Patient progress  limited by pain. Speech evaluation requested to assess cognition. Not sleeping at HS d/t hearing voices in hallway.  Patient on target to meet rehab goals: Today is evaluation day  *See Care Plan and progress notes for long and short-term goals.   Revisions to Treatment Plan:  Speech consult, monitor labs  Teaching Needs: Medications, safety, gait/transfer training, skin/wound care  Current Barriers to Discharge: Decreased caregiver support, Wound care, Medication compliance, and Behavior  Possible Resolutions to Barriers: Family education, medication education, skin/wound education, order recommended DME     Medical Summary Current Status: right intertrochanteric hip fx , Anemia, Leukocytosis, Constipation, anxiety  Barriers to Discharge: Home enviroment access/layout;Medical stability;Wound care  Barriers to Discharge Comments: right intertrochanteric hip fx , Anemia, Leukocytosis, Constipation, anxiety Possible Resolutions to [employer] Focus: monitor CBC, laxatives, continue celexa   Continued Need for Acute Rehabilitation Level of Care: The patient requires daily medical management by a physician with specialized training in physical medicine and rehabilitation for the following reasons: Direction of a multidisciplinary physical rehabilitation program to maximize functional independence : Yes Medical management of patient stability for increased activity during participation in an intensive rehabilitation regime.: Yes Analysis of laboratory values and/or radiology reports with any subsequent need for medication adjustment and/or medical intervention. : Yes   I attest  that I was present, lead the team conference, and concur with the assessment and plan of the team.   Jearld Adjutant 07/06/2022, 10:32 AM

## 2022-07-04 NOTE — Plan of Care (Signed)
  Problem: RH Balance Goal: LTG Patient will maintain dynamic standing with ADLs (OT) Description: LTG:  Patient will maintain dynamic standing balance with assist during activities of daily living (OT)  Flowsheets (Taken 07/04/2022 2226) LTG: Pt will maintain dynamic standing balance during ADLs with: Supervision/Verbal cueing   Problem: Sit to Stand Goal: LTG:  Patient will perform sit to stand in prep for activites of daily living with assistance level (OT) Description: LTG:  Patient will perform sit to stand in prep for activites of daily living with assistance level (OT) Flowsheets (Taken 07/04/2022 2226) LTG: PT will perform sit to stand in prep for activites of daily living with assistance level: Supervision/Verbal cueing   Problem: RH Grooming Goal: LTG Patient will perform grooming w/assist,cues/equip (OT) Description: LTG: Patient will perform grooming with assist, with/without cues using equipment (OT) Flowsheets (Taken 07/04/2022 2226) LTG: Pt will perform grooming with assistance level of: Independent   Problem: RH Bathing Goal: LTG Patient will bathe all body parts with assist levels (OT) Description: LTG: Patient will bathe all body parts with assist levels (OT) Flowsheets (Taken 07/04/2022 2226) LTG: Pt will perform bathing with assistance level/cueing: Supervision/Verbal cueing   Problem: RH Dressing Goal: LTG Patient will perform upper body dressing (OT) Description: LTG Patient will perform upper body dressing with assist, with/without cues (OT). Flowsheets (Taken 07/04/2022 2226) LTG: Pt will perform upper body dressing with assistance level of: Independent Goal: LTG Patient will perform lower body dressing w/assist (OT) Description: LTG: Patient will perform lower body dressing with assist, with/without cues in positioning using equipment (OT) Flowsheets (Taken 07/04/2022 2226) LTG: Pt will perform lower body dressing with assistance level of: Supervision/Verbal  cueing   Problem: RH Toileting Goal: LTG Patient will perform toileting task (3/3 steps) with assistance level (OT) Description: LTG: Patient will perform toileting task (3/3 steps) with assistance level (OT)  Flowsheets (Taken 07/04/2022 2226) LTG: Pt will perform toileting task (3/3 steps) with assistance level: Independent with assistive device   Problem: RH Simple Meal Prep Goal: LTG Patient will perform simple meal prep w/assist (OT) Description: LTG: Patient will perform simple meal prep with assistance, with/without cues (OT). Flowsheets (Taken 07/04/2022 2226) LTG: Pt will perform simple meal prep with assistance level of: Supervision/Verbal cueing   Problem: RH Light Housekeeping Goal: LTG Patient will perform light housekeeping w/assist (OT) Description: LTG: Patient will perform light housekeeping with assistance, with/without cues (OT). Flowsheets (Taken 07/04/2022 2226) LTG: Pt will perform light housekeeping with assistance level of: Supervision/Verbal cueing   Problem: RH Toilet Transfers Goal: LTG Patient will perform toilet transfers w/assist (OT) Description: LTG: Patient will perform toilet transfers with assist, with/without cues using equipment (OT) Flowsheets (Taken 07/04/2022 2226) LTG: Pt will perform toilet transfers with assistance level of: Supervision/Verbal cueing   Problem: RH Tub/Shower Transfers Goal: LTG Patient will perform tub/shower transfers w/assist (OT) Description: LTG: Patient will perform tub/shower transfers with assist, with/without cues using equipment (OT) Flowsheets (Taken 07/04/2022 2226) LTG: Pt will perform tub/shower stall transfers with assistance level of: Supervision/Verbal cueing

## 2022-07-04 NOTE — Progress Notes (Signed)
Patient ID: Amber Ruiz, female   DOB: [birth year], 81 y.o.   MRN: [id number]  SW met with pt in room to provide updates from team conference and d/c date 8/29. She reports she is agreeable to going to her son's home. States she doe snot know his address. When discussing home set up, [place] and there are steps to get into the home. Pt aware SW will follow-up with her son.   15- Sw left message for pt son Konrad Dolores [phone]) to introduce self, explain role, discuss discharge process, d/c date,  and confirm d/c plan. He reports he is unsure on if he will be able to accept pt at his home since he is a single dad and works a lot and unable to be home with his mother if this is what is needed. He asked about alternative options such as pt going somewhere. SW informed that there would not be an issue with placement, however, he would need to discuss wit his mother. SW also reiterated the importance of family education and this will help give him a better understanding of her care needs. Family edu tentatively scheduled for Tuesday (8/22) 1pm-4pm. He will call back if there are barriers.   Loralee Pacas, MSW, [place] Office: [phone] Cell: [phone] Fax: [phone]

## 2022-07-05 DIAGNOSIS — D62 Acute posthemorrhagic anemia: Secondary | ICD-10-CM | POA: Diagnosis not present

## 2022-07-05 DIAGNOSIS — R339 Retention of urine, unspecified: Secondary | ICD-10-CM

## 2022-07-05 DIAGNOSIS — F419 Anxiety disorder, unspecified: Secondary | ICD-10-CM | POA: Diagnosis not present

## 2022-07-05 DIAGNOSIS — K5900 Constipation, unspecified: Secondary | ICD-10-CM | POA: Diagnosis not present

## 2022-07-05 MED ORDER — SORBITOL 70 % SOLN
60.0000 mL | Freq: Once | Status: AC
  Administered 2022-07-05: 60 mL via ORAL
  Filled 2022-07-05: qty 60

## 2022-07-05 NOTE — Plan of Care (Signed)
  Problem: RH Problem Solving Goal: LTG Patient will demonstrate problem solving for (SLP) Description: LTG:  Patient will demonstrate problem solving for basic/complex daily situations with cues  (SLP) Flowsheets (Taken 07/05/2022 1554) LTG: Patient will demonstrate problem solving for (SLP):  Basic daily situations  Complex daily situations LTG Patient will demonstrate problem solving for: Modified Independent   Problem: RH Memory Goal: LTG Patient will use memory compensatory aids to (SLP) Description: LTG:  Patient will use memory compensatory aids to recall biographical/new, daily complex information with cues (SLP) Flowsheets (Taken 07/05/2022 1554) LTG: Patient will use memory compensatory aids to (SLP): Modified Independent

## 2022-07-05 NOTE — Care Management (Signed)
[facility] Individual Statement of Services  Patient Name:  Taia Bramlett  Date:  07/05/2022  Welcome to the [facility]patient [facility].  Our goal is to provide you with an individualized program based on your diagnosis and situation, designed to meet your specific needs.  With this comprehensive rehabilitation program, you will be expected to participate in at least 3 hours of rehabilitation therapies Monday-Friday, with modified therapy programming on the weekends.  Your rehabilitation program will include the following services:  Physical Therapy (PT), Occupational Therapy (OT), 24 hour per day rehabilitation nursing, Therapeutic Recreaction (TR), Psychology, Neuropsychology, Care Coordinator, Rehabilitation Medicine, Nutrition Services, Pharmacy Services, and Other  Weekly team conferences will be held on Tuesdays to discuss your progress.  Your Inpatient Rehabilitation Care Coordinator will talk with you frequently to get your input and to update you on team discussions.  Team conferences with you and your family in attendance may also be held.  Expected length of stay: 12-14 days    Overall anticipated outcome: Supervision  Depending on your progress and recovery, your program may change. Your Inpatient Rehabilitation Care Coordinator will coordinate services and will keep you informed of any changes. Your Inpatient Rehabilitation Care Coordinator's name and contact numbers are listed  below.  The following services may also be recommended but are not provided by the Inpatient [facility]:  Driving Evaluations Home Health Rehabiltiation Services Outpatient Rehabilitation Services Vocational Rehabilitation   Arrangements will be made to provide these services after discharge if needed.  Arrangements include referral to agencies that provide these services.  Your insurance has been verified to be:  Medicare A/B  Your primary doctor is:  Forrest Moron  Pertinent information will be shared with your doctor and your insurance company.  Inpatient Rehabilitation Care Coordinator:  Susie Cassette [phone] or [phone]  Information discussed with and copy given to patient by: Gretchen Short, 07/05/2022, 9:32 AM

## 2022-07-05 NOTE — Progress Notes (Signed)
Physical Therapy Session Note  Patient Details  Name: Amber Ruiz MRN: [id number] Date of Birth: [birth year]  Today's Date: 07/05/2022 PT Individual Time: 3149-7026 PT Individual Time Calculation (min): 30 min   Short Term Goals: Week 1:  PT Short Term Goal 1 (Week 1): Pt will ambulate >25 ft with LRAD PT Short Term Goal 2 (Week 1): Pt will initiate stair training PT Short Term Goal 3 (Week 1): Pt will perform bed mobility with mod A or better  Skilled Therapeutic Interventions/Progress Updates: Pt presents sitting in w/c finishing breakfast.  Pt agreeable to therapy.  Pt required mod A for threading pants over feet and pulling to knees.  Pt donned pull-over shirt w/ set-up and then assists w/ removing gown.  Pt transfers sit to stand w/ min A slowly and then pulls pants over hips w/ min A for balance only.  Pt states improved pain tolerance w/ increased WB RLE to > 10%.  Pt amb forward and back w/ RW and min A x 3' w/ verbal cues for sequencing and foot clearance RLE.  Pt remained sitting in w/c w/ chair alarm on and all needs in reach.     Therapy Documentation Precautions:  Precautions Precautions: Fall, Other (comment) Precaution Comments: watch BP and SpO2; vertigo Restrictions Weight Bearing Restrictions: Yes RLE Weight Bearing: Weight bearing as tolerated General:   Vital Signs:  Pain:8/10 Pain Assessment Pain Scale: 0-10 Pain Score: 6  Pain Type: Surgical pain Pain Location: Knee Pain Orientation: Right Pain Descriptors / Indicators: Aching Pain Onset: On-going Pain Intervention(s): Medication (See eMAR)     Therapy/Group: Individual Therapy  [name] 07/05/2022, 9:51 AM

## 2022-07-05 NOTE — Progress Notes (Signed)
Occupational Therapy Session Note  Patient Details  Name: Amber Ruiz MRN: [id number] Date of Birth: [birth year]  Today's Date: 07/05/2022 OT Individual Time: 1303-1400 OT Individual Time Calculation (min): 57 min    Short Term Goals: Week 1:  OT Short Term Goal 1 (Week 1): Pt will perform all UB self care with close S OT Short Term Goal 2 (Week 1): Pt will perform LB bathing and dressing with min a with AE/DME OT Short Term Goal 3 (Week 1): Pt wll transfer to shower and toilet with DME with min A OT Short Term Goal 4 (Week 1): Pt will stand for grooming sink side with CGA up to 5 min  Skilled Therapeutic Interventions/Progress Updates:    Patient received seated in wheelchair finishing lunch.  Patient agreeable to OT session.  Patient anticipating pain in RLE with functional mobility.  Patient with moderate edema in B feet R>L.   Transported patient in wheelchair to bathroom, then did stand step transfer to shower seat.  Patient needed moderate physical assistance to stand from wheelchair.  Patient needed step by step cueing for foot and walker placement.  Once seated patient needed frequent cues for steps of shower.  Patient very easily directed this session.  Patient with tremors in BLE.  Difficulty obtaining full foot on floor - stayed up on toes often.  When foot realigned - shaking in LE's stopped.  Patient shown how to use reacher, and this was helpful for her to thread her pants over her feet.  Patient again needed mod assistance to stand and was unable to free hands from support to pull her pants up over her hips.  Patient left up in wheelchair per her request.  Personal items and call bell within reach.    Therapy Documentation Precautions:  Precautions Precautions: Fall, Other (comment) Precaution Comments: watch BP and SpO2; vertigo Restrictions Weight Bearing Restrictions: Yes RLE Weight Bearing: Weight bearing as tolerated  Pain:  Reports 8/10 pain when right foot in  contact with ground    Therapy/Group: Individual Therapy  Collier Salina 07/05/2022, 3:18 PM

## 2022-07-05 NOTE — Evaluation (Signed)
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   OCD (obsessive compulsive disorder)    Past Surgical History:  Past Surgical History:  Procedure Laterality Date   ABDOMINAL HYSTERECTOMY     COLONOSCOPY     INTRAMEDULLARY (IM) NAIL INTERTROCHANTERIC Right 07/02/2022   Procedure: INTRAMEDULLARY (IM) NAIL INTERTROCHANTERIC;  Surgeon: Shona Needles, MD;  Location: [facility];  Service: Orthopedics;  Laterality: Right;    Assessment / Plan / Recommendation Clinical Impression Amber Ruiz is an 81 year old female with history of OCD, anxiety, anemia, vertigo in the past; who was admitted after reports of fall onto her right hip the night PTA, she stayed on the floor from 10 pm to 11 am till family found her. She was admitted next day on 07/01/22 for work up. She was found to have right IT femur fracture and underwent ORIF on 08/13 by Dr. Doreatha Martin. PT evaluations done revealing right nystagmus which was treated with modified L Dix Hallpike maneuvers.  OT evaluation completed today revealing pain, decrease in activity tolerance as well as difficulty advancing RLE. CIR recommended due to functional decline.   SLP was consulted due to concern for difficulty with cognitive tasks noted by PT/OT. Pt supports hitting her head during the fall that resulted in her femur fracture, however CT was unremarkable. Pt denied acute cognitive-communication changes. Per pt, she was fully independent at prior level with plans to discharge  to son's home. Pt endorsed mild memory changes which she felt were consistent with aging, however compensates with various external strategies that she described in detail. Pt was known to repeat the same thing on occasion without awareness and occasional verbal redirection was beneficial for topic maintenance. Today's interaction appeared appropriate from a pragmatic standpoint.   [facility] Mental Status Examination was completed to evaluate the pt's cognitive-linguistic skills. Pt achieved a score of 18/30 which is below the normal limits of 27 or more out of 30. Suspect pt may be near cognitive baseline and it is uncertain on whether medications, environmental changes, medical needs, etc. may be influencing current function, if any.   Considering decreased short-term recall, some difficulty with problem solving components, and pt's high level of function at prior level per pt's report, it would be clinically appropriate to initiate short-term SLP services with focus on education, further cognitive assessment, and discussion with pt's family to gain insight on pt's baseline. Pt verbalized understanding and agreement.    Skilled Therapeutic Interventions          Pt participated in [place] Status Examination (SLUMS) as well as further non-standardized assessments of cognitive-linguistic, speech, and language function. Please see above.   SLP Assessment  Patient will need skilled [facility] during CIR admission    Recommendations  Patient destination: Home Follow up Recommendations: Other (comment) (TBD) Equipment Recommended: None recommended by SLP    SLP Frequency 1 to 3 out of 7 days   SLP Duration  SLP Intensity  SLP Treatment/Interventions 08/29  Minumum  of 1-2 x/day, 30 to 90 minutes  Cognitive remediation/compensation;Internal/external aids;Functional tasks;Patient/family education;Therapeutic Activities    Pain     Prior Functioning Cognitive/Linguistic Baseline: Information not available Type of Home: House  Lives With: Alone Available Help at Discharge: Family Vocation: Retired  SLP Evaluation Cognition Overall Cognitive Status: No family/caregiver present to determine baseline cognitive functioning Arousal/Alertness: Awake/alert Orientation Level: Oriented X4 Year: 2023 Month: August Day of Week: Correct Attention: Sustained Sustained Attention: Appears intact Memory: Impaired Memory Impairment: Decreased recall of new information;Retrieval deficit Decreased Short Term Memory: Verbal basic Awareness: Appears intact Problem Solving: Impaired Problem Solving Impairment: Verbal complex Safety/Judgment: Appears intact  Comprehension Auditory Comprehension Overall Auditory Comprehension: Appears within functional limits for tasks assessed Expression Expression Primary Mode of Expression: Verbal Verbal Expression Overall Verbal Expression: Appears within functional limits for tasks assessed Written Expression Dominant Hand: Right Oral Motor Oral Motor/Sensory Function Overall Oral Motor/Sensory Function: Within functional limits Motor Speech Overall Motor Speech: Appears within functional limits for tasks assessed  Care Tool Care Tool Cognition Ability to hear (with hearing aid or hearing appliances if normally used Ability to hear (with hearing aid or hearing appliances if normally used): 0. Adequate - no difficulty in normal conservation, social interaction, listening to TV   Expression of Ideas and Wants Expression of Ideas and Wants: 4. Without difficulty (complex and basic) - expresses complex messages without difficulty and with speech that is clear and easy to understand   Understanding Verbal and Non-Verbal Content Understanding Verbal and Non-Verbal Content: 3. Usually understands - understands most conversations, but misses some part/intent of message. Requires cues at  times to understand  Memory/Recall Ability Memory/Recall Ability : Current season;That he or she is in a hospital/hospital unit   Short Term Goals: Week 1: SLP Short Term Goal 1 (Week 1): Patient will recall and describe up to 4 beneficial compensatory memory startegies with sup A verbal cues SLP Short Term Goal 2 (Week 1): Patient will complete mild-to-moderately complex problem solving effectively with min A verbal cues SLP Short Term Goal 3 (Week 1): Pt will complete medicaition and financial management tasks effectively with min A verbal cues  Refer to Care Plan for Long Term Goals  Recommendations for other services: None   Discharge Criteria: Patient will be discharged from SLP if patient refuses treatment 3 consecutive times without medical reason, if treatment goals not met, if there is a change in medical status, if patient makes no progress towards goals or if patient is discharged from hospital.  The above assessment, treatment plan, treatment alternatives and goals were discussed and mutually agreed upon: by patient  Patty Sermons 07/05/2022, 3:53 PM

## 2022-07-05 NOTE — Progress Notes (Signed)
Physical Therapy Session Note  Patient Details  Name: Amber Ruiz MRN: [id number] Date of Birth: 03/25/1941  Today's Date: 07/05/2022 PT Individual Time: 4742-5956 PT Individual Time Calculation (min): 40 min   Short Term Goals: Week 1:  PT Short Term Goal 1 (Week 1): Pt will ambulate >25 ft with LRAD PT Short Term Goal 2 (Week 1): Pt will initiate stair training PT Short Term Goal 3 (Week 1): Pt will perform bed mobility with mod A or better  Skilled Therapeutic Interventions/Progress Updates:   Received pt semi-reclined in bed with breakfast in front of her and MD present for morning rounds. Dr. Jena Gauss then arrived to check on pt. Pt hyperverbal and required significantly increased time with mobility this morning, proceeding to talk extensively with everyone who walked into room. Pt agreeable to PT treatment and reported soreness all over and said "if you were to get me up right now it will go up to a 10" - RN notified of request for pain medication. Repositioning, rest breaks, and distraction done to reduce pain levels. Session with emphasis on functional mobility/transfers and generalized strengthening and endurance. Attempted to don brief in supine but pt unable to roll or bridge hips due to pain. Pt transferred semi-reclined<>sitting EOB with HOB elevated and heavy use of bedrails with significantly increased time and max cues for sequencing with min/mod A for RLE management. Pt with difficulty sequencing scooting to EOB and required min A at hips. Orthopedic PA present to remove dressings and inspect incision. Pt transferred sit<>stand with RW from elevated EOB with min A and transferred bed<>WC stand<>pivot with RW and CGA - RN then arrived to administer medications. Concluded session with pt sitting in WC, needs within reach, and seatbelt alarm on.   Therapy Documentation Precautions:  Precautions Precautions: Fall, Other (comment) Precaution Comments: watch BP and SpO2;  vertigo Restrictions Weight Bearing Restrictions: Yes RLE Weight Bearing: Weight bearing as tolerated  Therapy/Group: Individual Therapy Martin Majestic PT, DPT  07/05/2022, 7:00 AM

## 2022-07-05 NOTE — Progress Notes (Signed)
Orthopaedic Trauma Progress Note  SUBJECTIVE: Doing well this morning.  Notes some soreness in the right hip from surgery but overall this has been well controlled.  Is about to get up to work with physical therapy. No other complaints  OBJECTIVE:  Vitals:   07/04/22 1331 07/05/22 0414  BP: (!) 131/52 (!) 147/62  Pulse: 88 78  Resp: 20 18  Temp: 98.5 F (36.9 C) 98 F (36.7 C)  SpO2: 97% 97%    General: Sitting up on edge of bed.  No acute distress. Answering questions appropriate. Respiratory: No increased work of breathing.  Right lower extremity: Dressings to left hip and thigh removed, incisions clean, dry, intact with Dermabond in place.  Able to get full knee extension. Ankle DF/PF intact.  Endorses sensation throughout extremity.  Neurovascularly intact.  IMAGING: Stable post op imaging.   LABS:  No results found for this or any previous visit (from the past 24 hour(s)).   ASSESSMENT: Amber Ruiz is a 81 y.o. female s/p INTRAMEDULLARY  NAIL RIGHT INTERTROCHANTERIC FEMUR FRACTURE 07/02/22   CV/Blood loss: Acute blood loss anemia, Hgb 8.1 on 07/04/2022.  Hemodynamically stable  PLAN: Weightbearing: WBAT RLE ROM: Okay for unrestricted hip and knee motion as tolerated Incisional and dressing care: Change dressing PRN. OK to leave incisions open to air Showering: Okay to begin showering/getting incisions wet  Orthopedic device(s): None  Pain management: continue current regimen VTE prophylaxis: Lovenox, SCDs Foley/Lines:  No foley, KVO IVFs Impediments to Fracture Healing: Vitamin D level 19, started on D supplementation. Dispo: Care per CIR. Will plan for repeat imaging right hip in 2 weeks if patient still in rehab. From ortho standpoint, patient ok to transition to DOAC and continue this for a total of 30 days post-op. Recommend continuing Vit D supplementation x8 weeks  Follow - up plan: 2 weeks after discharge for wound check and repeat x-rays   Contact  information:  Truitt Merle MD, Thyra Breed PA-C. After hours and holidays please check [email or web address] for group call information for [facility]   Thompson Caul, PA-C [phone] (office) [email or web address]

## 2022-07-05 NOTE — Progress Notes (Signed)
PROGRESS NOTE   Subjective/Complaints: She is in bed this AM.  She feels sore from surgery, has not had pain medications yet. She had IC this AM but only had 1ml.    Review of Systems  Constitutional:  Negative for chills and fever.  Eyes:  Negative for double vision.  Respiratory:  Negative for cough and shortness of breath.   Cardiovascular:  Negative for chest pain.  Gastrointestinal:  Positive for constipation. Negative for abdominal pain and nausea.  Genitourinary: Negative.   Neurological:  Positive for weakness.    Objective:   No results found. Recent Labs    07/03/22 0215 07/04/22 0541  WBC 12.9* 8.4  HGB 8.8* 8.1*  HCT 28.1* 25.0*  PLT 204 186    Recent Labs    07/03/22 0215 07/04/22 0541  NA 136 140  K 4.5 3.9  CL 104 109  CO2 26 24  GLUCOSE 144* 108*  BUN 14 16  CREATININE 0.89 0.67  CALCIUM 7.9* 8.1*     Intake/Output Summary (Last 24 hours) at 07/05/2022 1030 Last data filed at 07/05/2022 0500 Gross per 24 hour  Intake 236 ml  Output 616 ml  Net -380 ml         Physical Exam: Vital Signs Blood pressure (!) 147/62, pulse 78, temperature 98 F (36.7 C), temperature source Oral, resp. rate 18, height 5' (1.524 m), weight 66.1 kg, SpO2 97 %.   General: Alert and oriented x 3, No apparent distress HEENT: Head is normocephalic, atraumatic, PERRLA, EOMI, sclera anicteric, oral mucosa pink and moist Neck: Supple without JVD or lymphadenopathy Heart: Reg rate and rhythm. No murmurs rubs or gallops Chest: CTA bilaterally without wheezes, rales, or rhonchi; no distress Abdomen: Soft, non-tender, non-distended, bowel sounds positive. Extremities: No clubbing, cyanosis, or edema. Pulses are 2+ Psych: Pt's affect is appropriate. Pleasant  Musculoskeletal:        General: Swelling (Right hip) and tenderness present.     Cervical back: Normal range of motion.  Skin:    General: Skin is  warm.     Comments: Right hip incision  CDI with foam dressing.   Neurological:     Mental Status: She is alert and oriented to person, place, and time.     Cranial Nerves: CN 2-12 grossly I ntact    Sensory: No sensory deficit.     Coordination: Coordination normal.     Comments: Right hip limited by pain. Otherwise motor 5/5 UE, 3-4+/5 LLE, Right ADF/APF 4/5. No sensory findings. DTR's 1+     Assessment/Plan: 1. Functional deficits which require 3+ hours per day of interdisciplinary therapy in a comprehensive inpatient rehab setting. Physiatrist is providing close team supervision and 24 hour management of active medical problems listed below. Physiatrist and rehab team continue to assess barriers to discharge/monitor patient progress toward functional and medical goals  Care Tool:  Bathing    Body parts bathed by patient: Right arm, Left arm, Chest, Abdomen, Right upper leg, Left upper leg, Face   Body parts bathed by helper: Front perineal area, Buttocks, Right lower leg, Left lower leg     Bathing assist Assist Level: Maximal Assistance - Patient  24 - 49%     Upper Body Dressing/Undressing Upper body dressing   What is the patient wearing?: Pull over shirt    Upper body assist Assist Level: Contact Guard/Touching assist    Lower Body Dressing/Undressing Lower body dressing      What is the patient wearing?: Pants     Lower body assist Assist for lower body dressing: Moderate Assistance - Patient 50 - 74%     Toileting Toileting    Toileting assist Assist for toileting: Maximal Assistance - Patient 25 - 49%     Transfers Chair/bed transfer  Transfers assist  Chair/bed transfer activity did not occur: Safety/medical concerns  Chair/bed transfer assist level: Contact Guard/Touching assist     Locomotion Ambulation   Ambulation assist   Ambulation activity did not occur: Safety/medical concerns  Assist level: Minimal Assistance - Patient >  75% Assistive device: Walker-rolling Max distance: 5   Walk 10 feet activity   Assist  Walk 10 feet activity did not occur: Safety/medical concerns        Walk 50 feet activity   Assist Walk 50 feet with 2 turns activity did not occur: Safety/medical concerns         Walk 150 feet activity   Assist Walk 150 feet activity did not occur: Safety/medical concerns         Walk 10 feet on uneven surface  activity   Assist Walk 10 feet on uneven surfaces activity did not occur: Safety/medical concerns         Wheelchair     Assist Is the patient using a wheelchair?: Yes Type of Wheelchair: Manual Wheelchair activity did not occur: Safety/medical concerns         Wheelchair 50 feet with 2 turns activity    Assist    Wheelchair 50 feet with 2 turns activity did not occur: Safety/medical concerns       Wheelchair 150 feet activity     Assist  Wheelchair 150 feet activity did not occur: Safety/medical concerns       Blood pressure (!) 147/62, pulse 78, temperature 98 F (36.7 C), temperature source Oral, resp. rate 18, height 5' (1.524 m), weight 66.1 kg, SpO2 97 %. Medical Problem List and Plan: 1. Functional deficits secondary to a right intertrochanteric hip fx s/p ORIF 07/01/22             -patient may shower starting 8/16 if no drainage from incisions             -ELOS/Goals: 10-14 day, mod I with PT and OT  -SLP eval ordered 2.  Antithrombotics: -DVT/anticoagulation:  Pharmaceutical: Lovenox             -antiplatelet therapy: N/a 3. Pain Management: Hydrocodone prn.  4. Mood/Behavior/Sleep: LCSW to follow for evaluation and support.              --has been sleeping well.              -antipsychotic agents: N/A 5. Neuropsych/cognition: This patient is capable of making decisions on her own behalf. 6. Skin/Wound Care: Routine pressure relief measures.  7. Fluids/Electrolytes/Nutrition: Monitor I/O. Check Lytes in am.   -I/O cath  done on 8/16, appears she did not have void in several hours, although low volume noted on IC, will encourage PO intake 8. Anxiety d/o: Managed with celexa. She reports anxiety is stable 9. ABLA: Recheck CBC in am.   -HGB down to 8.1 8/15, monitor 10. Leucocytosis: wound clean,  chest clear. Likely reactive             -Improved to 8.4 8/15 11.  Constipation: Will add senna to colace.   -8/15 order milk of mg  -8/16 Sorbitol 52ml ordered    LOS: 2 days A FACE TO FACE EVALUATION WAS PERFORMED  Fanny Dance 07/05/2022, 10:30 AM

## 2022-07-06 DIAGNOSIS — F411 Generalized anxiety disorder: Secondary | ICD-10-CM

## 2022-07-06 DIAGNOSIS — K5901 Slow transit constipation: Secondary | ICD-10-CM | POA: Diagnosis not present

## 2022-07-06 DIAGNOSIS — S7291XD Unspecified fracture of right femur, subsequent encounter for closed fracture with routine healing: Secondary | ICD-10-CM | POA: Diagnosis not present

## 2022-07-06 DIAGNOSIS — D62 Acute posthemorrhagic anemia: Secondary | ICD-10-CM | POA: Diagnosis not present

## 2022-07-06 NOTE — Progress Notes (Signed)
Patient is known to [facility] as she has seen Dr. Dion Saucier in the past regarding her right knee pain. She has an MRI from earlier this year showing moderate degenerative changes in the medial compartment as well as a tear of the medial meniscus. We have so far been managing her pain well with meloxicam. Dr. Dion Saucier was contacted by family regarding right knee pain yesterday. I was asked to see patient and assess if she would benefit from a cortisone injection in the right knee. Patient feels like right knee pain is limiting her rehab, pain is mainly located medially. She does have 0-90 deg ROM of the right knee, medial joint line tenderness. Dorsiflexion and plantarflexion intact. Mild edema but no significant effusion. No ecchymosis or erythema. Discussed options with patient, she has elected to move forward with a cortisone shot in the right knee. I discussed plan with Dr. Luvenia Starch team, they agreed to plan.    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 2 ml of Marcaine and 1 ml of Kenalog (40mg ) was injected into the joint space. She tolerated this well without complication and a Band-Aid was placed.   , PA-C  07/06/2022 10:25 AM

## 2022-07-06 NOTE — Progress Notes (Signed)
Physical Therapy Session Note  Patient Details  Name: Amber Ruiz MRN: [id number] Date of Birth: [birth year]  Today's Date: 07/06/2022 PT Individual Time: 1150-1205 PT Individual Time Calculation (min): 15 min   Short Term Goals: Week 1:  PT Short Term Goal 1 (Week 1): Pt will ambulate >25 ft with LRAD PT Short Term Goal 2 (Week 1): Pt will initiate stair training PT Short Term Goal 3 (Week 1): Pt will perform bed mobility with mod A or better  Skilled Therapeutic Interventions/Progress Updates:    Pt seated in w/c on arrival and agreeable to therapy. Pt reports 9/10 pain with mobility, premedicated. Rest and positioning provided as needed. Pt missed 15 minutes d/t therapist's previous pt's care. Will make up as able.  Pt performed Sit to stand to RW with inreased time, and max cueing for hand placement.  Pt then performed mini squats x 10 and standing marches with CGA. Pt demoes slow initiation with possible motor planning deficits vs pain. Pt returned to w/c and was left with all needs in reach and alarm active.    Therapy Documentation Precautions:  Precautions Precautions: Fall, Other (comment) Precaution Comments: watch BP and SpO2; vertigo Restrictions Weight Bearing Restrictions: Yes RLE Weight Bearing: Weight bearing as tolerated General: PT Amount of Missed Time (min): 15 Minutes PT Missed Treatment Reason: Other (Comment) (previous pt care)    Therapy/Group: Individual Therapy  [name] 07/06/2022, 12:19 PM

## 2022-07-06 NOTE — Progress Notes (Signed)
Speech Language Pathology Daily Session Note  Patient Details  Name: Amber Ruiz MRN: [id number] Date of Birth: 1941-08-17  Today's Date: 07/06/2022 SLP Individual Time: 5366-4403 SLP Individual Time Calculation (min): 57 min  Short Term Goals: Week 1: SLP Short Term Goal 1 (Week 1): Patient will recall and describe up to 4 beneficial compensatory memory startegies with sup A verbal cues SLP Short Term Goal 2 (Week 1): Patient will complete mild-to-moderately complex problem solving effectively with min A verbal cues SLP Short Term Goal 3 (Week 1): Pt will complete medicaition and financial management tasks effectively with min A verbal cues  Skilled Therapeutic Interventions: Skilled treatment session focused on cognitive goals. SLP facilitated session by utilizing external aids within her room to maximize orientation. Patient reported feeling disoriented as she did not have any reference as to where she was in the hospital. SLP propelled the patient in her wheelchair and navigated throughout the unit to maximize visual references. Patient was very appreciative and able to recall the floor and unit with Mod question cues. SLP also facilitated session by providing education regarding her current medications and their function with Min verbal cues. Throughout session, patient was mildly verbose but easily redirected. Patient left upright in wheelchair with alarm on and all needs within reach. Continue with current plan of care.      Pain Pain Assessment Pain Scale: 0-10 Pain Score: 9  Pain Type: Acute pain Pain Orientation: Right Pain Descriptors / Indicators: Aching Pain Onset: On-going Pain Intervention(s): Medication (See eMAR)  Therapy/Group: Individual Therapy  Atoya Andrew 07/06/2022, 3:27 PM

## 2022-07-06 NOTE — IPOC Note (Signed)
Overall Plan of Care [facility]) Patient Details Name: Amber Ruiz MRN: [id number] DOB: [birth year]  Admitting Diagnosis: Femur fracture, right [facility])  Hospital Problems: Principal Problem:   Femur fracture, right (HCC)     Functional Problem List: Nursing Nutrition, Bladder, Bowel, Pain, Perception, Edema, Safety, Endurance, Sensory, Medication Management, Skin Integrity  PT Balance, Pain, Safety, Endurance, Motor, Skin Integrity  OT Balance, Motor, Behavior, Skin Integrity, Cognition, Pain, Edema, Endurance, Safety  SLP Cognition  TR         Basic ADL's: OT Grooming, Bathing, Dressing, Toileting     Advanced  ADL's: OT Simple Meal Preparation, Light Housekeeping, Laundry     Transfers: PT Bed Mobility, Bed to Chair, Customer service manager, Research scientist (life sciences): PT Ambulation, Psychologist, prison and probation services, Stairs     Additional Impairments: OT    SLP Social Cognition   Problem Solving, Memory  TR      Anticipated Outcomes Item Anticipated Outcome  Self Feeding Indep  Swallowing      Basic self-care  S/mod I  Toileting  S   Bathroom Transfers S  Bowel/Bladder  to be continent x 2 no BM noted  Transfers  supervision with LRAD  Locomotion  supervision with LRAD  Communication     Cognition  mod I  Pain  less than 3  Safety/Judgment  to remain fall free while in rehab   Therapy Plan: PT Intensity: Minimum of 1-2 x/day ,45 to 90 minutes PT Frequency: 5 out of 7 days PT Duration Estimated Length of Stay: 12-14 days OT Intensity: Minimum of 1-2 x/day, 45 to 90 minutes OT Frequency: 5 out of 7 days OT Duration/Estimated Length of Stay: 12-14 days SLP Intensity: Minumum of 1-2 x/day, 30 to 90 minutes SLP Frequency: 1 to 3 out of 7 days SLP Duration/Estimated Length of Stay: 08/29   Team Interventions: Nursing Interventions Patient/Family Education, Disease Management/Prevention, Skin Care/Wound Management, Discharge Planning, Bladder Management, Pain Management,  Psychosocial Support, Bowel Management, Medication Management  PT interventions Ambulation/gait training, Cognitive remediation/compensation, Discharge planning, DME/adaptive equipment instruction, Functional mobility training, Pain management, Psychosocial support, Splinting/orthotics, Therapeutic Activities, UE/LE Strength taining/ROM, Visual/perceptual remediation/compensation, Wheelchair propulsion/positioning, UE/LE Coordination activities, Stair training, Therapeutic Exercise, Skin care/wound management, Patient/family education, Neuromuscular re-education, Functional electrical stimulation, Disease management/prevention, Firefighter, Warden/ranger  OT Interventions Warden/ranger, Community reintegration, Disease mangement/prevention, Equities trader education, Self Care/advanced ADL retraining, Therapeutic Exercise, Cognitive remediation/compensation, Discharge planning, DME/adaptive equipment instruction, Functional mobility training, Pain management, Psychosocial support, Skin care/wound managment, Therapeutic Activities, UE/LE Strength taining/ROM  SLP Interventions Cognitive remediation/compensation, Internal/external aids, Functional tasks, Patient/family education, Therapeutic Activities  TR Interventions    SW/CM Interventions Discharge Planning, Psychosocial Support, Patient/Family Education   Barriers to Discharge MD  Medical stability  Nursing Inaccessible home environment, Decreased caregiver support, Home environment access/layout, Wound Care, Lack of/limited family support, Weight bearing restrictions    PT Home environment access/layout, Inaccessible home environment, Wound Care    OT Inaccessible home environment, Decreased caregiver support, Home environment access/layout, Lack of/limited family support family may not be able to provide S, steps to enter, 2 story home  SLP      SW Decreased caregiver support, Lack of/limited family  support     Team Discharge Planning: Destination: PT-Home ,OT- Home , SLP-Home Projected Follow-up: PT-Home health PT, OT-  Home health OT, SLP-Other (comment) (TBD) Projected Equipment Needs: PT-Rolling walker with 5" wheels, OT- 3 in 1 bedside comode, Rolling walker with 5" wheels, Tub/shower seat, SLP-None recommended by SLP Equipment  Details: PT- , OT-3 in 1, RW, shower seat Patient/family involved in discharge planning: PT- Patient,  OT-Patient, SLP-Patient  MD ELOS: 12-14 days Medical Rehab Prognosis:  Excellent Assessment: The patient has been admitted for CIR therapies with the diagnosis of right femur fx. The team will be addressing functional mobility, strength, stamina, balance, safety, adaptive techniques and equipment, self-care, bowel and bladder mgt, patient and caregiver education, pain mgt, cognition/memory. Goals have been set at supervision for self-care and mobility and mod I to supervision with cogntion. Anticipated discharge destination is home with family.        See Team Conference Notes for weekly updates to the plan of care

## 2022-07-06 NOTE — Progress Notes (Signed)
PROGRESS NOTE   Subjective/Complaints: No new issues today.   Review of Systems  Constitutional:  Negative for chills and fever.  Eyes:  Negative for double vision.  Respiratory:  Negative for cough and shortness of breath.   Cardiovascular:  Negative for chest pain.  Gastrointestinal:  Positive for constipation. Negative for abdominal pain and nausea.  Genitourinary: Negative.   Musculoskeletal:  Positive for joint pain and myalgias.  Neurological:  Positive for weakness.    Objective:   No results found. Recent Labs    07/04/22 0541  WBC 8.4  HGB 8.1*  HCT 25.0*  PLT 186    Recent Labs    07/04/22 0541  NA 140  K 3.9  CL 109  CO2 24  GLUCOSE 108*  BUN 16  CREATININE 0.67  CALCIUM 8.1*     Intake/Output Summary (Last 24 hours) at 07/06/2022 1151 Last data filed at 07/06/2022 0830 Gross per 24 hour  Intake 473 ml  Output 201 ml  Net 272 ml         Physical Exam: Vital Signs Blood pressure (!) 140/57, pulse 86, temperature 98.3 F (36.8 C), temperature source Oral, resp. rate 16, height 5' (1.524 m), weight 66.1 kg, SpO2 92 %.   Constitutional: No distress . Vital signs reviewed. HEENT: NCAT, EOMI, oral membranes moist Neck: supple Cardiovascular: RRR without murmur. No JVD    Respiratory/Chest: CTA Bilaterally without wheezes or rales. Normal effort    GI/Abdomen: BS +, non-tender, non-distended Ext: no clubbing, cyanosis, or edema Psych: pleasant and cooperative  Musculoskeletal:        General: Swelling (Right hip) and tenderness present.     Cervical back: Normal range of motion.  Skin:    General: Skin is warm.     Comments: Right hip incision  CDI still with foam dressing.   Neurological:     Mental Status: She is alert and oriented to person, place, and time.     Cranial Nerves: CN 2-12 grossly I ntact    Sensory: No sensory deficit.     Coordination: Coordination normal.      Comments: Right hip limited by pain. Otherwise motor 5/5 UE, 3-4+/5 LLE, Right ADF/APF 4/5. No sensory findings. DTR's 1+     Assessment/Plan: 1. Functional deficits which require 3+ hours per day of interdisciplinary therapy in a comprehensive inpatient rehab setting. Physiatrist is providing close team supervision and 24 hour management of active medical problems listed below. Physiatrist and rehab team continue to assess barriers to discharge/monitor patient progress toward functional and medical goals  Care Tool:  Bathing    Body parts bathed by patient: Right arm, Left arm, Chest, Abdomen, Right upper leg, Left upper leg, Face   Body parts bathed by helper: Front perineal area, Buttocks, Right lower leg, Left lower leg     Bathing assist Assist Level: Moderate Assistance - Patient 50 - 74%     Upper Body Dressing/Undressing Upper body dressing   What is the patient wearing?: Pull over shirt    Upper body assist Assist Level: Set up assist    Lower Body Dressing/Undressing Lower body dressing      What is  the patient wearing?: Pants     Lower body assist Assist for lower body dressing: Moderate Assistance - Patient 50 - 74%     Toileting Toileting    Toileting assist Assist for toileting: Maximal Assistance - Patient 25 - 49%     Transfers Chair/bed transfer  Transfers assist  Chair/bed transfer activity did not occur: Safety/medical concerns  Chair/bed transfer assist level: Minimal Assistance - Patient > 75%     Locomotion Ambulation   Ambulation assist   Ambulation activity did not occur: Safety/medical concerns  Assist level: Minimal Assistance - Patient > 75% Assistive device: Walker-rolling Max distance: 6   Walk 10 feet activity   Assist  Walk 10 feet activity did not occur: Safety/medical concerns        Walk 50 feet activity   Assist Walk 50 feet with 2 turns activity did not occur: Safety/medical concerns         Walk 150  feet activity   Assist Walk 150 feet activity did not occur: Safety/medical concerns         Walk 10 feet on uneven surface  activity   Assist Walk 10 feet on uneven surfaces activity did not occur: Safety/medical concerns         Wheelchair     Assist Is the patient using a wheelchair?: Yes Type of Wheelchair: Manual Wheelchair activity did not occur: Safety/medical concerns         Wheelchair 50 feet with 2 turns activity    Assist    Wheelchair 50 feet with 2 turns activity did not occur: Safety/medical concerns       Wheelchair 150 feet activity     Assist  Wheelchair 150 feet activity did not occur: Safety/medical concerns       Blood pressure (!) 140/57, pulse 86, temperature 98.3 F (36.8 C), temperature source Oral, resp. rate 16, height 5' (1.524 m), weight 66.1 kg, SpO2 92 %. Medical Problem List and Plan: 1. Functional deficits secondary to a right intertrochanteric hip fx s/p ORIF 07/01/22             -patient may shower starting 8/16 if no drainage from incisions             -ELOS/Goals: 10-14 day, mod I with PT and OT  -SLP eval ordered for cognitive delay/memory deficits  -Continue CIR therapies including PT, OT, and SLP  2.  Antithrombotics: -DVT/anticoagulation:  Pharmaceutical: Lovenox             -antiplatelet therapy: N/a 3. Pain Management: Hydrocodone prn.  4. Mood/Behavior/Sleep: LCSW to follow for evaluation and support.              --has been sleeping well.              -antipsychotic agents: N/A 5. Neuropsych/cognition: This patient is capable of making decisions on her own behalf. 6. Skin/Wound Care: Routine pressure relief measures.  7. Fluids/Electrolytes/Nutrition: Monitor I/O. Check Lytes in am.   -I/O cath done on 8/16 for minimal amount.  will encourage PO intake  -dc bladder scans 8. Anxiety d/o: Managed with celexa. She reports anxiety is stable 9. ABLA:  .   -HGB down to 8.1 8/15, recheck 8/18 10.  Leucocytosis: wound clean, chest clear. Likely reactive             -Improved to 8.4 8/15 11.  Constipation: Will add senna to colace.   -8/15 order milk of mg  -8/16 Sorbitol 9ml ordered   -  large bm 8/17  LOS: 3 days A FACE TO FACE EVALUATION WAS PERFORMED  Ranelle Oyster 07/06/2022, 11:51 AM

## 2022-07-06 NOTE — Progress Notes (Signed)
Occupational Therapy Session Note  Patient Details  Name: Amber Ruiz MRN: [id number] Date of Birth: 01-May-1941  Today's Date: 07/06/2022 OT Individual Time: 0932-6712 OT Individual Time Calculation (min): 75 min    Short Term Goals: Week 1:  OT Short Term Goal 1 (Week 1): Pt will perform all UB self care with close S OT Short Term Goal 2 (Week 1): Pt will perform LB bathing and dressing with min a with AE/DME OT Short Term Goal 3 (Week 1): Pt wll transfer to shower and toilet with DME with min A OT Short Term Goal 4 (Week 1): Pt will stand for grooming sink side with CGA up to 5 min  Skilled Therapeutic Interventions/Progress Updates:    Patient agreeable to participate in OT session. Reports 9/10 pain level in right hip during movement and mobility. Pt reports that she received pain medication prior to therapy session. Monitored pain during therapy session.    Patient participated in skilled OT session focusing on bed mobility, functional transfers, and ADL re-training. Therapist facilitated session by providing patient education throughout session while completing bathing and dressing seated in w/c at sink level. Patient educated to place right leg in pants/brief first prior to left due to pain and limited ROM. Pt demonstrated difficulty with active right knee extension and therapist provided physical assist to engage quad in order to extend knee. Pt able to complete with Active assist provided. Pt reliant on assistive device and/or countertop for balance initially although able to let go of countertop with both hands when pulls pants over hips. Pt provided with set-up to complete grooming, UB bathing and dressing with VC to remain on task. Mod Assist provided for LB bathing and dressing. Min A provided for toileting using BSC at sink.  Therapy Documentation Precautions:  Precautions Precautions: Fall, Other (comment) Precaution Comments: watch BP and SpO2; vertigo Restrictions Weight  Bearing Restrictions: Yes RLE Weight Bearing: Weight bearing as tolerated    Therapy/Group: Individual Therapy  Limmie Patricia, OTR/L,CBIS  Supplemental OT - [facility]  07/06/2022, 10:01 AM

## 2022-07-06 NOTE — Progress Notes (Signed)
Physical Therapy Session Note  Patient Details  Name: Amber Ruiz MRN: [id number] Date of Birth: 03/08/1941  Today's Date: 07/06/2022 PT Individual Time: 1300-1330 PT Individual Time Calculation (min): 30 min   Short Term Goals: Week 1:  PT Short Term Goal 1 (Week 1): Pt will ambulate >25 ft with LRAD PT Short Term Goal 2 (Week 1): Pt will initiate stair training PT Short Term Goal 3 (Week 1): Pt will perform bed mobility with mod A or better  Skilled Therapeutic Interventions/Progress Updates:     Patient in w/c in the room upon PT arrival. Patient alert and agreeable to PT session. Patient reported 9/10 R hip pain during session, RN made aware. PT provided repositioning, rest breaks, and distraction as pain interventions throughout session.   Reviewed patient's pain medications frequency and purpose. Patient requested Vicodin and Robaxin for pain management following education, RN made aware and provided medications during session.   Patient declined functional mobility at this time due to elevated pain levels. Spend increased time discussing nature of patient's fall PTA and hx of falls. 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 Reports mild bouts of vertigo sing admission and intermittent LOB PTA. Provided education on fall prevention, functional systems impacting balance (visual, sensory, musculoskeletal, vestibular) and importance of safety awareness when one or more of these systems are impacted. Patient very receptive and requested vestibular assessment to determine if this is a recurrent factor in her balance deficits at this time. Plan for vestibular eval on Sunday with this provider.   Patient in w/c in the room at end of session with breaks locked, seat belt alarm set, and all needs within reach.   Therapy Documentation Precautions:  Precautions Precautions: Fall, Other (comment) Precaution Comments: watch BP and SpO2;  vertigo Restrictions Weight Bearing Restrictions: Yes RLE Weight Bearing: Weight bearing as tolerated    Therapy/Group: Individual Therapy  Amber Ruiz L Rejoice Heatwole PT, DPT, NCS, CBIS  07/06/2022, 6:18 PM

## 2022-07-07 ENCOUNTER — Inpatient Hospital Stay (HOSPITAL_COMMUNITY): Payer: Medicare Other

## 2022-07-07 DIAGNOSIS — K5901 Slow transit constipation: Secondary | ICD-10-CM | POA: Diagnosis not present

## 2022-07-07 DIAGNOSIS — M7989 Other specified soft tissue disorders: Secondary | ICD-10-CM

## 2022-07-07 DIAGNOSIS — S7291XD Unspecified fracture of right femur, subsequent encounter for closed fracture with routine healing: Secondary | ICD-10-CM | POA: Diagnosis not present

## 2022-07-07 DIAGNOSIS — D62 Acute posthemorrhagic anemia: Secondary | ICD-10-CM | POA: Diagnosis not present

## 2022-07-07 DIAGNOSIS — F411 Generalized anxiety disorder: Secondary | ICD-10-CM | POA: Diagnosis not present

## 2022-07-07 LAB — CBC
HCT: 27.5 % — ABNORMAL LOW (ref 36.0–46.0)
Hemoglobin: 8.8 g/dL — ABNORMAL LOW (ref 12.0–15.0)
MCH: 27.2 pg (ref 26.0–34.0)
MCHC: 32 g/dL (ref 30.0–36.0)
MCV: 84.9 fL (ref 80.0–100.0)
Platelets: 268 10*3/uL (ref 150–400)
RBC: 3.24 MIL/uL — ABNORMAL LOW (ref 3.87–5.11)
RDW: 14.5 % (ref 11.5–15.5)
WBC: 6.7 10*3/uL (ref 4.0–10.5)
nRBC: 0.3 % — ABNORMAL HIGH (ref 0.0–0.2)

## 2022-07-07 NOTE — Progress Notes (Signed)
Occupational Therapy Session Note  Patient Details  Name: Amber Ruiz MRN: [id number] Date of Birth: 1941-04-18  Today's Date: 07/07/2022 OT Individual Time: 4580-9983 & 3825-0539 OT Individual Time Calculation (min): 75 min & 45 min   Short Term Goals: Week 1:  OT Short Term Goal 1 (Week 1): Pt will perform all UB self care with close S OT Short Term Goal 2 (Week 1): Pt will perform LB bathing and dressing with min a with AE/DME OT Short Term Goal 3 (Week 1): Pt wll transfer to shower and toilet with DME with min A OT Short Term Goal 4 (Week 1): Pt will stand for grooming sink side with CGA up to 5 min  Skilled Therapeutic Interventions/Progress Updates:    Session 1:  Upon OT arrival, pt semi recumbent in bed talking with doctor. Pt mildly anxious and reports pain at a 7/10 in the R LE. Pt agreeable to OT treatment session. OT treatment with a focus on self care retraining. Pt completes full shower ADL at the levels listed below requiring verbal cues to dress weaker side first. Pt requires reassurance during session on her quick progress thus far and appears to decrease her anxiety. Pt performs functional bed mobility with SBA and stand pivot transfer with CGA. Pt making progress towards stated OT goals and continues to benefit from OT services to maximize independence and safety during ADLs. Pt was left in her w/c at end of session with all needs met.    Session 2 Upon OT arrival, pt seated in w/c reporting 4/10 pain and was agreeable to OT treatment. Treatment intervention with a focus on IADLs and UE strengthening. Pt was transported to rehab apartment via w/c and total A for time management. Pt completes sit to stand transfer with RW and CGA and ambulates within the kitchen to retrieve various dishes from countertop one at a time and returns them to their designated location in kitchen. Pt with minimal decreased short term memory and requires verbal cues to remind pt of directions and  locations of items. Pt talkative throughout session which may distract pt. Pt had no LOB throughout task and requires verbal cues for safe walker placement and body positioning with item placement and retrieval. Pt completes task with CGA and returns to her w/c with CGA using RW. Pt was transported to ortho gym via w/c and total A and completes UE exercises using 2lb dumbbell. Pt completes 1x10 reps of shoulder flex/ext, shoulder abd/add, chest press, overhead press, elbow flex/ext, and sup/pron. Pt tolerates well requiring verbal cues for proper form. Pt was transported back to her room via w/c and requests to use the bathroom. NT made aware and presents as this therapist was leaving.   Therapy Documentation Precautions:  Precautions Precautions: Fall, Other (comment) Precaution Comments: watch BP and SpO2; vertigo Restrictions Weight Bearing Restrictions: Yes RLE Weight Bearing: Weight bearing as tolerated  ADL: Grooming: Supervision/safety Where Assessed-Grooming: Sitting at sink Upper Body Bathing: Supervision/safety Where Assessed-Upper Body Bathing: Shower Lower Body Bathing: Minimal assistance Where Assessed-Lower Body Bathing: Shower Upper Body Dressing: Setup Where Assessed-Upper Body Dressing: Sitting at sink, Other (Comment) (shower) Lower Body Dressing: Maximal assistance Where Assessed-Lower Body Dressing: Sitting at sink, Standing at sink [employer] Transfer: Curator Method: Stand pivot Youth worker: Shower seat with back    Therapy/Group: Individual Therapy  Amber Ruiz 07/07/2022, 10:08 AM

## 2022-07-07 NOTE — Progress Notes (Signed)
Physical Therapy Session Note  Patient Details  Name: Amber Ruiz MRN: [id number] Date of Birth: [birth year]  Today's Date: 07/07/2022 PT Individual Time: 1100-1155 PT Individual Time Calculation (min): 55 min   Short Term Goals: Week 1:  PT Short Term Goal 1 (Week 1): Pt will ambulate >25 ft with LRAD PT Short Term Goal 2 (Week 1): Pt will initiate stair training PT Short Term Goal 3 (Week 1): Pt will perform bed mobility with mod A or better  Skilled Therapeutic Interventions/Progress Updates:    Pt seated in w/c on arrival and agreeable to therapy. Pt reports some pain with mobility, but largely controlled with medication at this time. Rest and mobility as tolerated. Pt reports she had a bad morning but was feeling some better. Pt transported to [facility] entrance for benefit of sunshine and outdoor environment on mood. Demoed lifted spirits and brighter affect during and after session. Pt participated in discussion of her home and hobbies (gardening) throughout session. Pt ambulated x 100 ft and x 120 ft with RW and CGA. Demoes slow pace and alternating step to/step through pattern depending on fatigue and stiffness. Pt able to clear RLE but demoes difficulty with hip and knee flexion. Pt also performed ankle pumps for edema management. Provided education on performing throughout the day to help manage edema  and improve mobility. Pt returned to room and remained in w/c, was left with all needs in reach and alarm active.   Therapy Documentation Precautions:  Precautions Precautions: Fall, Other (comment) Precaution Comments: watch BP and SpO2; vertigo Restrictions Weight Bearing Restrictions: Yes RLE Weight Bearing: Weight bearing as tolerated General:       Therapy/Group: Individual Therapy  [name] 07/07/2022, 12:03 PM

## 2022-07-07 NOTE — Progress Notes (Signed)
Speech Language Pathology Daily Session Note  Patient Details  Name: Amber Ruiz MRN: [id number] Date of Birth: Oct 16, 1941  Today's Date: 07/07/2022 SLP Individual Time: 1450-1540 SLP Individual Time Calculation (min): 50 min  Short Term Goals: Week 1: SLP Short Term Goal 1 (Week 1): Patient will recall and describe up to 4 beneficial compensatory memory startegies with sup A verbal cues SLP Short Term Goal 2 (Week 1): Patient will complete mild-to-moderately complex problem solving effectively with min A verbal cues SLP Short Term Goal 3 (Week 1): Pt will complete medicaition and financial management tasks effectively with min A verbal cues  Skilled Therapeutic Interventions: Skilled ST treatment focused on cognitive goals. Pt was oriented to all concepts with mod I, and recalled location of room with sup A verbal cues. Pt expressed gratitude for having the opportunity to navigate around unit with SLP yesterday and stated she feels more at ease with hospital environment. Pt reports having felt "out of it" upon first transferring to CIR, but feels much more herself. SLP facilitated medication management task involving comprehension of medication labels with mod I, and solving hypothetical scenarios attributed to medication errors with mod I. Pt exhibited appropriate problem solving and reasoning skills for this mildly complex task. Patient was left in wheelchair with NT at bedside.     Pain  None/denied  Therapy/Group: Individual Therapy  [name] 07/07/2022, 4:41 PM

## 2022-07-07 NOTE — Progress Notes (Signed)
PROGRESS NOTE   Subjective/Complaints: Pt says she doesn't feel well. Concerned about bowels (had large bm yesterday) and bladder emptying. Also feels that legs are "heavy", particularly her RLE. Right leg still tender from fx    Review of Systems  Constitutional:  Negative for chills and fever.  Eyes:  Negative for double vision.  Respiratory:  Negative for cough and shortness of breath.   Cardiovascular:  Negative for chest pain.  Gastrointestinal:  Negative for abdominal pain, constipation and nausea.  Genitourinary: Negative.   Musculoskeletal:  Positive for joint pain and myalgias.  Neurological:  Positive for weakness.    Objective:   No results found. Recent Labs    07/07/22 0615  WBC 6.7  HGB 8.8*  HCT 27.5*  PLT 268    No results for input(s): "NA", "K", "CL", "CO2", "GLUCOSE", "BUN", "CREATININE", "CALCIUM" in the last 72 hours.   Intake/Output Summary (Last 24 hours) at 07/07/2022 1001 Last data filed at 07/07/2022 0732 Gross per 24 hour  Intake 478 ml  Output 650 ml  Net -172 ml         Physical Exam: Vital Signs Blood pressure (!) 150/60, pulse 68, temperature 98.4 F (36.9 C), temperature source Oral, resp. rate 18, height 5' (1.524 m), weight 66.1 kg, SpO2 96 %.   Constitutional: No distress . Vital signs reviewed. HEENT: NCAT, EOMI, oral membranes moist Neck: supple Cardiovascular: RRR without murmur. No JVD    Respiratory/Chest: CTA Bilaterally without wheezes or rales. Normal effort    GI/Abdomen: BS +, non-tender, non-distended Ext: no clubbing, cyanosis, or edema Psych: pleasant but anxious   Musculoskeletal:        General: swelling RLE especially proximally > LLE    Cervical back: Normal range of motion.  Skin:    General: Skin is warm.     Comments: Right hip incision  CDI. Surrounding bruising evident  Neurological:     Mental Status: She is alert and oriented to person,  place, and time.     Cranial Nerves: CN 2-12 grossly I ntact    Sensory: No sensory deficit.     Coordination: Coordination normal.     Comments: Right hip limited by pain. Otherwise motor 5/5 UE, 3-4+/5 LLE, Right ADF/APF 4/5. No sensory findings. DTR's 1+     Assessment/Plan: 1. Functional deficits which require 3+ hours per day of interdisciplinary therapy in a comprehensive inpatient rehab setting. Physiatrist is providing close team supervision and 24 hour management of active medical problems listed below. Physiatrist and rehab team continue to assess barriers to discharge/monitor patient progress toward functional and medical goals  Care Tool:  Bathing    Body parts bathed by patient: Right arm, Left arm, Chest, Abdomen, Right upper leg, Left upper leg, Face   Body parts bathed by helper: Front perineal area, Buttocks, Right lower leg, Left lower leg     Bathing assist Assist Level: Moderate Assistance - Patient 50 - 74%     Upper Body Dressing/Undressing Upper body dressing   What is the patient wearing?: Pull over shirt    Upper body assist Assist Level: Set up assist    Lower Body Dressing/Undressing Lower body  dressing      What is the patient wearing?: Pants     Lower body assist Assist for lower body dressing: Moderate Assistance - Patient 50 - 74%     Toileting Toileting    Toileting assist Assist for toileting: Maximal Assistance - Patient 25 - 49%     Transfers Chair/bed transfer  Transfers assist  Chair/bed transfer activity did not occur: Safety/medical concerns  Chair/bed transfer assist level: Minimal Assistance - Patient > 75%     Locomotion Ambulation   Ambulation assist   Ambulation activity did not occur: Safety/medical concerns  Assist level: Minimal Assistance - Patient > 75% Assistive device: Walker-rolling Max distance: 6   Walk 10 feet activity   Assist  Walk 10 feet activity did not occur: Safety/medical  concerns        Walk 50 feet activity   Assist Walk 50 feet with 2 turns activity did not occur: Safety/medical concerns         Walk 150 feet activity   Assist Walk 150 feet activity did not occur: Safety/medical concerns         Walk 10 feet on uneven surface  activity   Assist Walk 10 feet on uneven surfaces activity did not occur: Safety/medical concerns         Wheelchair     Assist Is the patient using a wheelchair?: Yes Type of Wheelchair: Manual Wheelchair activity did not occur: Safety/medical concerns         Wheelchair 50 feet with 2 turns activity    Assist    Wheelchair 50 feet with 2 turns activity did not occur: Safety/medical concerns       Wheelchair 150 feet activity     Assist  Wheelchair 150 feet activity did not occur: Safety/medical concerns       Blood pressure (!) 150/60, pulse 68, temperature 98.4 F (36.9 C), temperature source Oral, resp. rate 18, height 5' (1.524 m), weight 66.1 kg, SpO2 96 %. Medical Problem List and Plan: 1. Functional deficits secondary to a right intertrochanteric hip fx s/p ORIF 07/01/22             -patient may shower starting 8/16 if no drainage from incisions             -ELOS/Goals: 10-14 day, mod I with PT and OT, SLP  --Continue CIR therapies including PT, OT, and SLP  2.  Antithrombotics: -DVT/anticoagulation:  Pharmaceutical: Lovenox -check LE dopplers today given ongoing swelling/pain             -antiplatelet therapy: N/a 3. Pain Management: Hydrocodone prn.  4. Mood/Behavior/Sleep: LCSW to follow for evaluation and support.              --has been sleeping well.              -antipsychotic agents: N/A 5. Neuropsych/cognition: This patient is capable of making decisions on her own behalf. 6. Skin/Wound Care: Routine pressure relief measures.  7. Fluids/Electrolytes/Nutrition: Monitor I/O.  Marland Kitchen   -I/O cath done on 8/16 for minimal amount.  will encourage PO intake  -stopped  bladder scans  -encourage voiding out of bed when possible 8. Anxiety d/o: Managed with celexa. Havin anxiety issues currently  -team to provide ego support, tried to reassure pt today  -might benefit from neuropsych visit  -would like to avoid benzos if possible, consider low dose xanax prn 9. ABLA:  .   -HGB down to 8.1 8/15--> 8.8  8/18 10.  Leucocytosis: wound clean, chest clear. Likely reactive             -Improved to 8.4 8/15 11.  Constipation: continue senna-s.   -large BM 8/17  LOS: 4 days A FACE TO FACE EVALUATION WAS PERFORMED  Ranelle Oyster 07/07/2022, 10:01 AM

## 2022-07-08 DIAGNOSIS — D62 Acute posthemorrhagic anemia: Secondary | ICD-10-CM | POA: Diagnosis not present

## 2022-07-08 DIAGNOSIS — F411 Generalized anxiety disorder: Secondary | ICD-10-CM | POA: Diagnosis not present

## 2022-07-08 DIAGNOSIS — S7291XD Unspecified fracture of right femur, subsequent encounter for closed fracture with routine healing: Secondary | ICD-10-CM | POA: Diagnosis not present

## 2022-07-08 DIAGNOSIS — K5901 Slow transit constipation: Secondary | ICD-10-CM | POA: Diagnosis not present

## 2022-07-08 NOTE — Progress Notes (Signed)
Occupational Therapy Session Note  Patient Details  Name: Amber Ruiz MRN: [id number] Date of Birth: 1941/08/20  Today's Date: 07/08/2022 OT Individual Time: 1300-1415 OT Individual Time Calculation (min): 75 min    Short Term Goals: Week 1:  OT Short Term Goal 1 (Week 1): Pt will perform all UB self care with close S OT Short Term Goal 2 (Week 1): Pt will perform LB bathing and dressing with min a with AE/DME OT Short Term Goal 3 (Week 1): Pt wll transfer to shower and toilet with DME with min A OT Short Term Goal 4 (Week 1): Pt will stand for grooming sink side with CGA up to 5 min  Skilled Therapeutic Interventions/Progress Updates:    Upon OT arrival, pt seated in w/c reporting pain at 5/10 in R LE. Pt agreeable to OT treatment session. Treatment intervention with a focus on self care retraining and strengthening. Pt requests to use the bathroom before going to the gym. Pt completes sit to stand transfer with CGA and ambulates to bathroom with RW and CGA and completes toilet transfer with CGA and toileting with CGA. Pt ambulates to w/c with CGA with RW and completes stand to sit transfer with CGA. Pt was transported infront of sink to complete hand hygiene and oral hygiene while seated with Supervision. Pt was transported to main therapy gym via w/c and total A and sits at tabletop to flip over [employer] and sort based on shape using B UE and 1lb wrist weights. Pt demonstrates mod difficulty following directions requiring verbal cues to remind pt of them. Pt requires increased time to sort and rest breaks as needed. Quirkle game pieces were divided to B sides of tabletop and pt crosses midline to retrieve one game piece at a time, completes a rainbow arc motion and returns game pieces to the game bag. Rest breaks required as needed. Pt reports the urge to urinate at end of task and was transported back to her room via w/c and Total A for time and completes sit to stand transfer with  RW and CGA to complete toilet transfer with CGA and verbal cues for safe hand placement and CGA for toileting. Pt ambulates back to her w/c with CGA and completes stand to sit transfer with CGA. Pt was left in w/c at end of session and hands were cleaned. All needs met.  Therapy Documentation Precautions:  Precautions Precautions: Fall, Other (comment) Precaution Comments: watch BP and SpO2; vertigo Restrictions Weight Bearing Restrictions: Yes RLE Weight Bearing: Weight bearing as tolerated     Therapy/Group: Individual Therapy  Amber Ruiz 07/08/2022, 1:59 PM

## 2022-07-08 NOTE — Progress Notes (Addendum)
PROGRESS NOTE   Subjective/Complaints: Still worried a bit about her bowels but admits she had a good day with therapy yesterday! Did have a dizzy spell this morning when turning head. 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   Review of Systems  Constitutional:  Negative for chills and fever.  Eyes:  Negative for double vision.  Respiratory:  Negative for cough and shortness of breath.   Cardiovascular:  Negative for chest pain.  Gastrointestinal:  Negative for abdominal pain, constipation and nausea.  Genitourinary: Negative.   Musculoskeletal:  Positive for joint pain and myalgias.  Neurological:  Positive for weakness.    Objective:   VAS [place] LOWER EXTREMITY VENOUS (DVT)  Result Date: 07/07/2022  Lower Venous DVT Study Patient Name:  Amber Ruiz  Date of Exam:   07/07/2022 Medical Rec #: [id number]        Accession #:    [id number] Date of Birth: August 19, 1941         Patient Gender: F Patient Age:   81 years Exam Location:  [facility] Procedure:      VAS [place] LOWER EXTREMITY VENOUS (DVT) Referring Phys: Faith Rogue --------------------------------------------------------------------------------  Indications: Swelling - rehab patient. Other Indications: Right femur fracture s/p surgical repair (07/02/22). Comparison Study: No previous exams Performing Technologist: Jody Hill RVT, RDMS  Examination Guidelines: A complete evaluation includes B-mode imaging, spectral Doppler, color Doppler, and power Doppler as needed of all accessible portions of each vessel. Bilateral testing is considered an integral part of a complete examination. Limited examinations for reoccurring indications may be performed as noted. The reflux portion of the exam is performed with the patient in reverse Trendelenburg.  +---------+---------------+---------+-----------+----------+--------------+ RIGHT     CompressibilityPhasicitySpontaneityPropertiesThrombus Aging +---------+---------------+---------+-----------+----------+--------------+ CFV      Full           Yes      Yes                                 +---------+---------------+---------+-----------+----------+--------------+ SFJ      Full                                                        +---------+---------------+---------+-----------+----------+--------------+ FV Prox  Full           Yes      Yes                                 +---------+---------------+---------+-----------+----------+--------------+ FV Mid   Full           Yes      Yes                                 +---------+---------------+---------+-----------+----------+--------------+ FV DistalFull           Yes  Yes                                 +---------+---------------+---------+-----------+----------+--------------+ PFV      Full                                                        +---------+---------------+---------+-----------+----------+--------------+ POP      Full           Yes      Yes                                 +---------+---------------+---------+-----------+----------+--------------+ PTV      Full                                                        +---------+---------------+---------+-----------+----------+--------------+ PERO     Full                                                        +---------+---------------+---------+-----------+----------+--------------+   +---------+---------------+---------+-----------+----------+--------------+ LEFT     CompressibilityPhasicitySpontaneityPropertiesThrombus Aging +---------+---------------+---------+-----------+----------+--------------+ CFV      Full           Yes      Yes                                 +---------+---------------+---------+-----------+----------+--------------+ SFJ      Full                                                         +---------+---------------+---------+-----------+----------+--------------+ FV Prox  Full           Yes      Yes                                 +---------+---------------+---------+-----------+----------+--------------+ FV Mid   Full           Yes      Yes                                 +---------+---------------+---------+-----------+----------+--------------+ FV DistalFull           Yes      Yes                                 +---------+---------------+---------+-----------+----------+--------------+ PFV      Full                                                        +---------+---------------+---------+-----------+----------+--------------+  POP      Full           Yes      Yes                                 +---------+---------------+---------+-----------+----------+--------------+ PTV      Full                                                        +---------+---------------+---------+-----------+----------+--------------+ PERO     Full                                                        +---------+---------------+---------+-----------+----------+--------------+    Summary: BILATERAL: - No evidence of deep vein thrombosis seen in the lower extremities, bilaterally. -No evidence of popliteal cyst, bilaterally.   *See table(s) above for measurements and observations.    Preliminary    Recent Labs    07/07/22 0615  WBC 6.7  HGB 8.8*  HCT 27.5*  PLT 268    No results for input(s): "NA", "K", "CL", "CO2", "GLUCOSE", "BUN", "CREATININE", "CALCIUM" in the last 72 hours.   Intake/Output Summary (Last 24 hours) at 07/08/2022 0939 Last data filed at 07/08/2022 0355 Gross per 24 hour  Intake 658 ml  Output 200 ml  Net 458 ml         Physical Exam: Vital Signs Blood pressure (!) 164/75, pulse 67, temperature 98.2 F (36.8 C), temperature source Oral, resp. rate 18, height 5' (1.524 m), weight 66.1 kg, SpO2 98  %.   Constitutional: No distress . Vital signs reviewed. HEENT: NCAT, EOMI, oral membranes moist Neck: supple Cardiovascular: RRR without murmur. No JVD    Respiratory/Chest: CTA Bilaterally without wheezes or rales. Normal effort    GI/Abdomen: BS +, non-tender, non-distended Ext: no clubbing, cyanosis, or edema Psych: pleasant. Less anxious today Musculoskeletal:        General: swelling RLE especially proximally > LLE--no change    Cervical back: Normal range of motion.  Skin:    General: Skin is warm.     Comments: Right hip incision  CDI. Surrounding bruising remains  Neurological:     Mental Status: She is alert and oriented to person, place, and time.     No nystagmus appreciated    Cranial Nerves: CN 2-12 grossly I ntact    Sensory: No sensory deficit.     Coordination: Coordination normal.     Comments: Right hip limited by pain. Otherwise motor 5/5 UE, 3-4+/5 LLE, Right ADF/APF 4/5. No sensory findings. DTR's 1+     Assessment/Plan: 1. Functional deficits which require 3+ hours per day of interdisciplinary therapy in a comprehensive inpatient rehab setting. Physiatrist is providing close team supervision and 24 hour management of active medical problems listed below. Physiatrist and rehab team continue to assess barriers to discharge/monitor patient progress toward functional and medical goals  Care Tool:  Bathing    Body parts bathed by patient: Right arm, Left arm, Chest, Abdomen, Right upper leg, Left upper leg, Face, Buttocks, Front perineal area   Body parts bathed by helper:  Right lower leg, Left lower leg     Bathing assist Assist Level: Minimal Assistance - Patient > 75%     Upper Body Dressing/Undressing Upper body dressing   What is the patient wearing?: Pull over shirt    Upper body assist Assist Level: Set up assist    Lower Body Dressing/Undressing Lower body dressing      What is the patient wearing?: Pants, Incontinence brief      Lower body assist Assist for lower body dressing: Moderate Assistance - Patient 50 - 74%     Toileting Toileting    Toileting assist Assist for toileting: Maximal Assistance - Patient 25 - 49%     Transfers Chair/bed transfer  Transfers assist  Chair/bed transfer activity did not occur: Safety/medical concerns  Chair/bed transfer assist level: Minimal Assistance - Patient > 75%     Locomotion Ambulation   Ambulation assist   Ambulation activity did not occur: Safety/medical concerns  Assist level: Minimal Assistance - Patient > 75% Assistive device: Walker-rolling Max distance: 6   Walk 10 feet activity   Assist  Walk 10 feet activity did not occur: Safety/medical concerns        Walk 50 feet activity   Assist Walk 50 feet with 2 turns activity did not occur: Safety/medical concerns         Walk 150 feet activity   Assist Walk 150 feet activity did not occur: Safety/medical concerns         Walk 10 feet on uneven surface  activity   Assist Walk 10 feet on uneven surfaces activity did not occur: Safety/medical concerns         Wheelchair     Assist Is the patient using a wheelchair?: Yes Type of Wheelchair: Manual Wheelchair activity did not occur: Safety/medical concerns         Wheelchair 50 feet with 2 turns activity    Assist    Wheelchair 50 feet with 2 turns activity did not occur: Safety/medical concerns       Wheelchair 150 feet activity     Assist  Wheelchair 150 feet activity did not occur: Safety/medical concerns       Blood pressure (!) 164/75, pulse 67, temperature 98.2 F (36.8 C), temperature source Oral, resp. rate 18, height 5' (1.524 m), weight 66.1 kg, SpO2 98 %. Medical Problem List and Plan: 1. Functional deficits secondary to a right intertrochanteric hip fx s/p ORIF 07/01/22             -patient may shower starting 8/16 if no drainage from incisions             -ELOS/Goals: 10-14 day,  mod I with PT and OT, SLP  -Continue CIR therapies including PT, OT, and SLP    -will request vestibular evaluation given sx/hx of vertigo 2.  Antithrombotics: -DVT/anticoagulation:  Pharmaceutical: Lovenox - LE dopplers negative--encourage elevation of legs when possible/ TEDS             -antiplatelet therapy: N/a 3. Pain Management: Hydrocodone prn.  4. Mood/Behavior/Sleep: LCSW to follow for evaluation and support.              --has been sleeping well.              -antipsychotic agents: N/A 5. Neuropsych/cognition: This patient is capable of making decisions on her own behalf. 6. Skin/Wound Care: Routine pressure relief measures.  7. Fluids/Electrolytes/Nutrition: Monitor I/O.  .   -emptying without issues currently  -stopped  bladder scans  -encourage voiding out of bed when possible 8. Anxiety d/o: Managed with celexa. Intermittent anxiety attacks  -team providing ego support on a daily basis  -might benefit from neuropsych visit  -would like to avoid benzos if possible, consider low dose xanax prn potentially 9. ABLA:  .   -HGB down to 8.1 8/15--> 8.8  8/18 10. Leucocytosis: wound clean, chest clear. Likely reactive             -Improved to 8.4 8/15 11.  Constipation: improved -continue senna-s.   -large BM again today 8/19  LOS: 5 days A FACE TO FACE EVALUATION WAS PERFORMED  Ranelle OysterZachary T Dominque Marlin 07/08/2022, 9:39 AM

## 2022-07-08 NOTE — Progress Notes (Signed)
Speech Language Pathology Daily Session Note  Patient Details  Name: Amber Ruiz MRN: [id number] Date of Birth: 1940/11/25  Today's Date: 07/08/2022 SLP Individual Time: 1015-1100 SLP Individual Time Calculation (min): 45 min  Short Term Goals: Week 1: SLP Short Term Goal 1 (Week 1): Patient will recall and describe up to 4 beneficial compensatory memory startegies with sup A verbal cues SLP Short Term Goal 2 (Week 1): Patient will complete mild-to-moderately complex problem solving effectively with min A verbal cues SLP Short Term Goal 3 (Week 1): Pt will complete medicaition and financial management tasks effectively with min A verbal cues  Skilled Therapeutic Interventions: Pt seen for skilled ST with focus on cognitive goals, pt in bed and agreeable to therapeutic tasks despite having a poor morning and not feeling well. SLP facilitating mildly complex problem solving task related to home environment by providing overall min A verbal cues for accuracy. Pt has life alert pendant at home she was not wearing at time of fall (resulting in prolonged down time), discussed strategies to increase effectiveness of pendant as well as reducing falls at home. Pt mostly concerned with hx of vertigo and thinking "it's coming back", continued education and training on potential hazard recognition and fall prevention is ongoing. Pt was observed to have occasions of higher level word finding trouble this date, benefits from extra time and min verbal cues. Pt left in bed with all needs met, cont ST POC.   Pain Pain Assessment Pain Scale: 0-10 Pain Score: 0-No pain  Therapy/Group: Individual Therapy  Dewaine Conger 07/08/2022, 1:44 PM

## 2022-07-08 NOTE — Progress Notes (Signed)
Physical Therapy Session Note  Patient Details  Name: Amber Ruiz MRN: [id number] Date of Birth: [birth year]  Today's Date: 07/08/2022 PT Individual Time: 1132-1211 PT Individual Time Calculation (min): 39 min   Short Term Goals: Week 1:  PT Short Term Goal 1 (Week 1): Pt will ambulate >25 ft with LRAD PT Short Term Goal 2 (Week 1): Pt will initiate stair training PT Short Term Goal 3 (Week 1): Pt will perform bed mobility with mod A or better  Skilled Therapeutic Interventions/Progress Updates:    Chart reviewed and pt agreeable to therapy. Pt received semi-reclined in bed with no c/o pain. Session focused on functional transfers and ambulation to promote safe home access and education on expectations of healing process to answer pt concerns. Pt initiated session with transfer to EOB on L side using MinA for roll and side>sit. Pt then transferred to [facility] with CGA. Pt amb 2x 18ft with CGA + RW. Throughout session pt asked significant questions about healing process in regards to leg swelling, kidney/bowel control, and recovery timeline which were answered by PT. At end of session, pt was left seated in [facility] with alarm engaged, nurse call bell and all needs in reach.     Therapy Documentation Precautions:  Precautions Precautions: Fall, Other (comment) Precaution Comments: watch BP and SpO2; vertigo Restrictions Weight Bearing Restrictions: Yes RLE Weight Bearing: Weight bearing as tolerated    Therapy/Group: Individual Therapy  Dionne Milo, PT, DPT 07/08/2022, 12:50 PM

## 2022-07-09 ENCOUNTER — Encounter (HOSPITAL_COMMUNITY): Payer: Self-pay | Admitting: Physical Medicine and Rehabilitation

## 2022-07-09 ENCOUNTER — Other Ambulatory Visit: Payer: Self-pay

## 2022-07-09 DIAGNOSIS — D62 Acute posthemorrhagic anemia: Secondary | ICD-10-CM | POA: Diagnosis not present

## 2022-07-09 DIAGNOSIS — F411 Generalized anxiety disorder: Secondary | ICD-10-CM | POA: Diagnosis not present

## 2022-07-09 DIAGNOSIS — K5901 Slow transit constipation: Secondary | ICD-10-CM | POA: Diagnosis not present

## 2022-07-09 DIAGNOSIS — S7291XD Unspecified fracture of right femur, subsequent encounter for closed fracture with routine healing: Secondary | ICD-10-CM | POA: Diagnosis not present

## 2022-07-09 NOTE — Progress Notes (Signed)
Patient seen and examined.  Pain under control.  Right knee is feeling somewhat better, right leg is getting more mobile.  She feels like the swelling in her leg is better than it was.  Wounds look clean, dressings intact, Band-Aid removed from the right knee.  EHL and FHL are intact.  Positive bruising over the right thigh.   Ultrasound was negative for DVT   Impression: Right hip fracture status post IM nail, with right knee osteoarthritis, status post injection  Plan doing well clinically, continue with rehab, appreciate their excellent care.  Okay to weight-bear as tolerated.  Lovenox for DVT prophylaxis.  Teryl Lucy, MD

## 2022-07-09 NOTE — Progress Notes (Signed)
Physical Therapy Session Note  Patient Details  Name: Amber Ruiz MRN: [id number] Date of Birth: 1941-09-24  Today's Date: 07/09/2022 PT Individual Time: 1047-1200 PT Individual Time Calculation (min): 73 min   Short Term Goals: Week 1:  PT Short Term Goal 1 (Week 1): Pt will ambulate >25 ft with LRAD PT Short Term Goal 2 (Week 1): Pt will initiate stair training PT Short Term Goal 3 (Week 1): Pt will perform bed mobility with mod A or better  Skilled Therapeutic Interventions/Progress Updates: Pt presents supine in bed w/ feet off to L side of bed, trying to "warm them in the sun."  Pt agreeable to therapy.  Pt transfers sup to sit w/ min to CGA and verbal cues.  Pt assisted w/ threading feet through pant legs and pulling over knees.  Pt transferred sit to stand w/ CGA and then able to pull pants over hips w/o UE support w/ CGA for steadying only.  Pt admits to most WB through LLE, but is able to increase WB w/ good tolerance w/ conscious thought.  Pt amb x 15' w/ RW and CGA/min A into BR and transfers to toilet w/ CGA.  Pt continent of bowel and bladder in toilet, NT to chart.  Pt transfers sit to stand and performs pericare w/ CGA only for steadying as needed.  Pt manages clothing/brief w/ only cueing.  Pt amb to w/c w/ CGA.  Pt amb to sink to wash hands, brush teeth and hair w/o UE support and verbal cueing for maintaining WB to RLE.  Pt wheeled to main gym for time and energy conservation.  Pt negotiated 4 steps w/ B rails and mod to min A, step to gait pattern.  Pt amb 25', 50' w/ RW and CGA, verbal cues for foot clearance and improved swing-through phase.  Pt remained sitting in w/c w/ chair alarm on and all needs in reach.  Pt states family concerned about ability to return home at D/C, asking PT about what is better for her.  Counseled that pt will be in the facility to next week and any recommendations will be made for her safety     Therapy Documentation Precautions:   Precautions Precautions: Fall, Other (comment) Precaution Comments: watch BP and SpO2; vertigo Restrictions Weight Bearing Restrictions: Yes RLE Weight Bearing: Weight bearing as tolerated General:   Vital Signs:  Pain:6/10      Therapy/Group: Individual Therapy  [name] 07/09/2022, 12:56 PM

## 2022-07-09 NOTE — Progress Notes (Addendum)
Physical Therapy Session Note  Patient Details  Name: Amber Ruiz MRN: [id number] Date of Birth: [birth year]  Today's Date: 07/09/2022 PT Individual Time: 1415-1520 PT Individual Time Calculation (min): 65 min   Short Term Goals: Week 1:  PT Short Term Goal 1 (Week 1): Pt will ambulate >25 ft with LRAD PT Short Term Goal 2 (Week 1): Pt will initiate stair training PT Short Term Goal 3 (Week 1): Pt will perform bed mobility with mod A or better  Skilled Therapeutic Interventions/Progress Updates:     Patient in w/c with RN in the room upon PT arrival. Patient alert and agreeable to PT session. Patient reported 8/10 R hip pain during session, RN made aware and provided pain medication during session. PT provided repositioning, rest breaks, and distraction as pain interventions throughout session.   Focused session on vestibular evaluation due to patient's hx of BPPV and recent mild bouts of vertigo and LOB, see details below. VBI screen negative and cervical ROM WFL. Patient denied hx of cervical injury or pain prior to assessment.    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, new onset of mild symptoms since admission to hospital    Type of Dizziness  Vertigo;"World moves";Imbalance    Frequency of Dizziness intermittent    Duration of Dizziness <30 sec    Symptom Nature Motion provoked;Intermittent    Aggravating Factors Turning body quickly;Turning head quickly;Forward bending    Relieving Factors Rest;Avoiding busy/distracting areas    Progression of Symptoms No change since onset    History of similar episodes onset at time of fall PTA leading to hip fx      Oculomotor Exam   Oculomotor Alignment Normal    Ocular ROM WFL    Spontaneous Absent    Gaze-induced  Absent    Smooth Pursuits  Intact    Saccades Dysmetria;Undershoots;Comment   moves head with eyes     Oculomotor Exam-Fixation Suppressed    Left Head Impulse negative    Right Head Impulse negative      Vestibulo-Ocular Reflex   VOR 1 Head Only (x 1 viewing) poor coordination, fatigue with task <30 sec      Positional Testing   Dix-Hallpike Dix-Hallpike Right;Dix-Hallpike Left      Dix-Hallpike Right   Dix-Hallpike Right Duration <45 sec    Dix-Hallpike Right Symptoms Upbeat, right rotatory nystagmus   nausea and vertigo present with nystagmus     Dix-Hallpike Left   Dix-Hallpike Left Duration none    Dix-Hallpike Left Symptoms No nystagmus      Cognition   Cognition Orientation Level Oriented x 4            Results and Recommendations:  Patient presents with oculomotor dysmetria and incoordination and hx of avoidance behaviors present with mobility, consistent with vestibular hypofunction, as well R posterior canalithiasis with positive R Dix Hallpike (in trendelenburg) with upbeat R rotatory nystagmus. Initiated treatment with R Epley Maneuver x1 with bed in trendelenburg with increased symptoms of vertigo and nausea, resolved <5 min of rest following. Plan for re-test and treat PRN starting tomorrow afternoon. Recommend incorporating accomodation and habituation exercises for hypofunction with patient POC for improved symptoms and reduced risk of falls.   Therapeutic Activity: Transfers: Patient performed an ambulatory toilet transfer to/from  the bathroom with CGA-close supervision using RW. Provided verbal cues for safety awareness with RW on incline into the bathroom. Patient was continent of bowel and bladder during toileting. Performed peri-care and lower body clothing management with supervision and increased time. She performed stand pivot bed>w/c with supervision for safety using RW.   Patient in w/c in the room at end of session with breaks locked, seat belt alarm set, and all needs within  reach.   Therapy Documentation Precautions:  Precautions Precautions: Fall, Other (comment) Precaution Comments: watch BP and SpO2; vertigo Restrictions Weight Bearing Restrictions: Yes RLE Weight Bearing: Weight bearing as tolerated    Therapy/Group: Individual Therapy  Martena Emanuele L Jimi Giza PT, DPT, NCS, CBIS  07/09/2022, 7:46 PM

## 2022-07-09 NOTE — Progress Notes (Signed)
PROGRESS NOTE   Subjective/Complaints: Woke up feeling a little nauseas today. Otherwise doing fairly well. Admits that her anxiety can cause her to feel nauseas. Frustrated that right leg still hurts and is swollen.    Review of Systems  Constitutional:  Negative for chills and fever.  Eyes:  Negative for double vision.  Respiratory:  Negative for cough and shortness of breath.   Cardiovascular:  Negative for chest pain.  Gastrointestinal:  Negative for abdominal pain, constipation and nausea.  Genitourinary: Negative.   Musculoskeletal:  Positive for joint pain and myalgias.  Neurological:  Positive for weakness.    Objective:   VAS [place] LOWER EXTREMITY VENOUS (DVT)  Result Date: 07/07/2022  Lower Venous DVT Study Patient Name:  Amber Ruiz  Date of Exam:   07/07/2022 Medical Rec #: [id number]        Accession #:    [id number] Date of Birth: 13-Nov-1941         Patient Gender: F Patient Age:   81 years Exam Location:  [facility] Procedure:      VAS [place] LOWER EXTREMITY VENOUS (DVT) Referring Phys: Faith Rogue --------------------------------------------------------------------------------  Indications: Swelling - rehab patient. Other Indications: Right femur fracture s/p surgical repair (07/02/22). Comparison Study: No previous exams Performing Technologist: Jody Hill RVT, RDMS  Examination Guidelines: A complete evaluation includes B-mode imaging, spectral Doppler, color Doppler, and power Doppler as needed of all accessible portions of each vessel. Bilateral testing is considered an integral part of a complete examination. Limited examinations for reoccurring indications may be performed as noted. The reflux portion of the exam is performed with the patient in reverse Trendelenburg.  +---------+---------------+---------+-----------+----------+--------------+ RIGHT    CompressibilityPhasicitySpontaneityPropertiesThrombus  Aging +---------+---------------+---------+-----------+----------+--------------+ CFV      Full           Yes      Yes                                 +---------+---------------+---------+-----------+----------+--------------+ SFJ      Full                                                        +---------+---------------+---------+-----------+----------+--------------+ FV Prox  Full           Yes      Yes                                 +---------+---------------+---------+-----------+----------+--------------+ FV Mid   Full           Yes      Yes                                 +---------+---------------+---------+-----------+----------+--------------+ FV DistalFull           Yes      Yes                                 +---------+---------------+---------+-----------+----------+--------------+  PFV      Full                                                        +---------+---------------+---------+-----------+----------+--------------+ POP      Full           Yes      Yes                                 +---------+---------------+---------+-----------+----------+--------------+ PTV      Full                                                        +---------+---------------+---------+-----------+----------+--------------+ PERO     Full                                                        +---------+---------------+---------+-----------+----------+--------------+   +---------+---------------+---------+-----------+----------+--------------+ LEFT     CompressibilityPhasicitySpontaneityPropertiesThrombus Aging +---------+---------------+---------+-----------+----------+--------------+ CFV      Full           Yes      Yes                                 +---------+---------------+---------+-----------+----------+--------------+ SFJ      Full                                                         +---------+---------------+---------+-----------+----------+--------------+ FV Prox  Full           Yes      Yes                                 +---------+---------------+---------+-----------+----------+--------------+ FV Mid   Full           Yes      Yes                                 +---------+---------------+---------+-----------+----------+--------------+ FV DistalFull           Yes      Yes                                 +---------+---------------+---------+-----------+----------+--------------+ PFV      Full                                                        +---------+---------------+---------+-----------+----------+--------------+  POP      Full           Yes      Yes                                 +---------+---------------+---------+-----------+----------+--------------+ PTV      Full                                                        +---------+---------------+---------+-----------+----------+--------------+ PERO     Full                                                        +---------+---------------+---------+-----------+----------+--------------+    Summary: BILATERAL: - No evidence of deep vein thrombosis seen in the lower extremities, bilaterally. -No evidence of popliteal cyst, bilaterally.   *See table(s) above for measurements and observations.    Preliminary    Recent Labs    07/07/22 0615  WBC 6.7  HGB 8.8*  HCT 27.5*  PLT 268    No results for input(s): "NA", "K", "CL", "CO2", "GLUCOSE", "BUN", "CREATININE", "CALCIUM" in the last 72 hours.   Intake/Output Summary (Last 24 hours) at 07/09/2022 0920 Last data filed at 07/08/2022 1840 Gross per 24 hour  Intake 476 ml  Output --  Net 476 ml         Physical Exam: Vital Signs Blood pressure (!) 156/76, pulse 81, temperature 98.3 F (36.8 C), resp. rate 18, height 5' (1.524 m), weight 66.1 kg, SpO2 95 %.   Constitutional: No distress . Vital signs  reviewed. HEENT: NCAT, EOMI, oral membranes moist Neck: supple Cardiovascular: RRR without murmur. No JVD    Respiratory/Chest: CTA Bilaterally without wheezes or rales. Normal effort    GI/Abdomen: BS +, non-tender, non-distended Ext: no clubbing, cyanosis  Psych: pleasant and cooperative, sl anxious per baseline Musculoskeletal:        General: swelling RLE especially proximally > LLE--stable    Cervical back: Normal range of motion.  Skin:    General: Skin is warm.     Comments: Right hip incision  CDI. Surrounding bruising along thigh  Neurological:     Mental Status: She is alert and oriented to person, place, and time.     No nystagmus appreciated    Cranial Nerves: CN 2-12 grossly I ntact    Sensory: No sensory deficit.     Coordination: Coordination normal.     Comments: Right hip limited by pain. Otherwise motor 5/5 UE, 3-4+/5 LLE, Right ADF/APF 4/5. No sensory findings. DTR's 1+     Assessment/Plan: 1. Functional deficits which require 3+ hours per day of interdisciplinary therapy in a comprehensive inpatient rehab setting. Physiatrist is providing close team supervision and 24 hour management of active medical problems listed below. Physiatrist and rehab team continue to assess barriers to discharge/monitor patient progress toward functional and medical goals  Care Tool:  Bathing    Body parts bathed by patient: Right arm, Left arm, Chest, Abdomen, Right upper leg, Left upper leg, Face, Buttocks, Front perineal area   Body parts bathed by helper: Right lower  leg, Left lower leg     Bathing assist Assist Level: Minimal Assistance - Patient > 75%     Upper Body Dressing/Undressing Upper body dressing   What is the patient wearing?: Pull over shirt    Upper body assist Assist Level: Set up assist    Lower Body Dressing/Undressing Lower body dressing      What is the patient wearing?: Pants, Incontinence brief     Lower body assist Assist for lower body  dressing: Moderate Assistance - Patient 50 - 74%     Toileting Toileting    Toileting assist Assist for toileting: Contact Guard/Touching assist     Transfers Chair/bed transfer  Transfers assist  Chair/bed transfer activity did not occur: Safety/medical concerns  Chair/bed transfer assist level: Minimal Assistance - Patient > 75%     Locomotion Ambulation   Ambulation assist   Ambulation activity did not occur: Safety/medical concerns  Assist level: Minimal Assistance - Patient > 75% Assistive device: Walker-rolling Max distance: 6   Walk 10 feet activity   Assist  Walk 10 feet activity did not occur: Safety/medical concerns        Walk 50 feet activity   Assist Walk 50 feet with 2 turns activity did not occur: Safety/medical concerns         Walk 150 feet activity   Assist Walk 150 feet activity did not occur: Safety/medical concerns         Walk 10 feet on uneven surface  activity   Assist Walk 10 feet on uneven surfaces activity did not occur: Safety/medical concerns         Wheelchair     Assist Is the patient using a wheelchair?: Yes Type of Wheelchair: Manual Wheelchair activity did not occur: Safety/medical concerns         Wheelchair 50 feet with 2 turns activity    Assist    Wheelchair 50 feet with 2 turns activity did not occur: Safety/medical concerns       Wheelchair 150 feet activity     Assist  Wheelchair 150 feet activity did not occur: Safety/medical concerns       Blood pressure (!) 156/76, pulse 81, temperature 98.3 F (36.8 C), resp. rate 18, height 5' (1.524 m), weight 66.1 kg, SpO2 95 %. Medical Problem List and Plan: 1. Functional deficits secondary to a right intertrochanteric hip fx s/p ORIF 07/01/22             -patient may shower starting 8/16 if no drainage from incisions             -ELOS/Goals: 10-14 day, mod I with PT and OT, SLP  -Continue CIR therapies including PT, OT, and SLP     -have requested vestibular evaluation given sx/hx of vertigo 2.  Antithrombotics: -DVT/anticoagulation:  Pharmaceutical: Lovenox - LE dopplers negative--encourage elevation of legs when possible/ TEDS             -antiplatelet therapy: N/a 3. Pain Management: Hydrocodone prn.  4. Mood/Behavior/Sleep: LCSW to follow for evaluation and support.              --has been sleeping well.              -antipsychotic agents: N/A 5. Neuropsych/cognition: This patient is capable of making decisions on her own behalf. 6. Skin/Wound Care: Routine pressure relief measures.  7. Fluids/Electrolytes/Nutrition: Monitor I/O.  .   -emptying without issues currently  -stopped bladder scans  -encourage voiding out of bed when  possible 8. Anxiety d/o: Managed with celexa. Intermittent anxiety attacks  -team providing ego support on a daily basis  -have requested neuropsych visit  -would like to avoid benzos if possible, consider low dose xanax prn potentially 9. ABLA:  .   -HGB down to 8.1 8/15--> 8.8  8/18 10. Leucocytosis: wound clean, chest clear. Likely reactive             -Improved to 8.4 8/15 11.  Constipation: improved -continue senna-s.   -large BM 8/19  LOS: 6 days A FACE TO [place] 07/09/2022, 9:20 AM

## 2022-07-10 LAB — BASIC METABOLIC PANEL
Anion gap: 6 (ref 5–15)
BUN: 17 mg/dL (ref 8–23)
CO2: 26 mmol/L (ref 22–32)
Calcium: 8.7 mg/dL — ABNORMAL LOW (ref 8.9–10.3)
Chloride: 106 mmol/L (ref 98–111)
Creatinine, Ser: 0.68 mg/dL (ref 0.44–1.00)
GFR, Estimated: >60 mL/min (ref >60)
Glucose, Bld: 149 mg/dL — ABNORMAL HIGH (ref 70–99)
Potassium: 4.1 mmol/L (ref 3.5–5.1)
Sodium: 138 mmol/L (ref 135–145)

## 2022-07-10 LAB — CBC
HCT: 32.2 % — ABNORMAL LOW (ref 36.0–46.0)
Hemoglobin: 9.9 g/dL — ABNORMAL LOW (ref 12.0–15.0)
MCH: 27.3 pg (ref 26.0–34.0)
MCHC: 30.7 g/dL (ref 30.0–36.0)
MCV: 88.7 fL (ref 80.0–100.0)
Platelets: 382 10*3/uL (ref 150–400)
RBC: 3.63 MIL/uL — ABNORMAL LOW (ref 3.87–5.11)
RDW: 16.3 % — ABNORMAL HIGH (ref 11.5–15.5)
WBC: 7.1 10*3/uL (ref 4.0–10.5)
nRBC: 0 % (ref 0.0–0.2)

## 2022-07-10 NOTE — Progress Notes (Signed)
Physical Therapy Session Note  Patient Details  Name: Amber Ruiz MRN: [id number] Date of Birth: April 28, 1941  Today's Date: 07/10/2022 PT Individual Time: 1400-1500 PT Individual Time Calculation (min): 60 min   Short Term Goals: Week 1:  PT Short Term Goal 1 (Week 1): Pt will ambulate >25 ft with LRAD PT Short Term Goal 2 (Week 1): Pt will initiate stair training PT Short Term Goal 3 (Week 1): Pt will perform bed mobility with mod A or better  Skilled Therapeutic Interventions/Progress Updates:     Patient in w/c in the room upon PT arrival. Patient alert and agreeable to PT session. Patient reported 6/10 R hip pain during session, RN made aware. PT provided repositioning, rest breaks, and distraction as pain interventions throughout session.   Therapeutic Activity: Transfers: Patient performed stand pivot w/c>bed with supervision for safety using RW.   Followed up vestibular assessment and treatment yesterday. Patient denied symptoms of vertigo, nausea, or imbalance since treatment yesterday. Reports, "I feel so much better."   Performed B Gilberto Better and B Roll Test with negative results and patient denied symptoms throughout. All canals cleared at this time. Plan to reassess prior to d/c or if symptoms return.   Neuromuscular Re-ed: Patient performed the following accomodation and habituation exercises: -seated VOR x1 with popsicle stick with marked target 5x1 min or until symptoms escalated to 4/10 with 2 min rest break between or symptoms resolve, educating on performing 3 times per day outside of therapy sessions, patient reported only eye fatigue with exercise x1 set -cervical rotation 2x10 focused on full ROM and head on body movement with quiet trunk  Recommend progressing VOR to standing and with increased balance or visual challenge as tolerated, and progressing cervical motion with functional mobility in standing and walking for improved habituation with activity. May add  seated saccades and VOR x2 with progression to standing with increased balance challenge as tolerated.   Patient in bed at end of session with breaks locked, bed alarm set, and all needs within reach. Patient very appreciative of results of vestibular assessment and treatment, please re-consult if symptoms recur or to progress exercises as needed.   Therapy Documentation Precautions:  Precautions Precautions: Fall, Other (comment) Precaution Comments: watch BP and SpO2; vertigo Restrictions Weight Bearing Restrictions: Yes RLE Weight Bearing: Weight bearing as tolerated    Therapy/Group: Individual Therapy  Grae Cannata L Hisao Doo PT, DPT, NCS, CBIS  07/10/2022, 7:16 PM

## 2022-07-10 NOTE — Progress Notes (Signed)
Speech Language Pathology Daily Session Note  Patient Details  Name: Amber Ruiz MRN: [id number] Date of Birth: 01/12/1941  Today's Date: 07/10/2022 SLP Individual Time: 0037-0488 SLP Individual Time Calculation (min): 57 min  Short Term Goals: Week 1: SLP Short Term Goal 1 (Week 1): Patient will recall and describe up to 4 beneficial compensatory memory startegies with sup A verbal cues SLP Short Term Goal 2 (Week 1): Patient will complete mild-to-moderately complex problem solving effectively with min A verbal cues SLP Short Term Goal 3 (Week 1): Pt will complete medicaition and financial management tasks effectively with min A verbal cues  Skilled Therapeutic Interventions: Skilled ST treatment focused on cognitive goals. Pt received phone call from son at start of session and stated she had called him earlier this morning, however was unable to recall their conversation. Pt also reported a similar instance with being unable to recall a previous phone conversation with daughter. This was bothersome to patient and she perseverated on this, as well as other topics, throughout session. Pt reported she feels that her memory is getting "worse" since being in the hospital and she acknowledges she does not feel her "normal self" s/p surgery. SLP and pt discussed internal and external memory strategies. SLP facilitated use of written notes while providing pt with memory notebook for use at bedside. Pt appreciative of discussion/education. Pt was able to recall up to 3 beneficial strategies with sup A verbal cues and verbalize uses in detail following education. Will report pt's concerns regarding memory with team. Patient was left in wheelchair with alarm activated and immediate needs within reach at end of session. Continue per current plan of care.      Pain None/denied  Therapy/Group: Individual Therapy  [name] 07/10/2022, 12:13 PM

## 2022-07-10 NOTE — Progress Notes (Signed)
PROGRESS NOTE   Subjective/Complaints:  Pt reports rough this AM- took pain pill last night for last time- pain 67-/10 Also got treatment for vertigo 2 crystals were loose- is doing somewhat better but treatment hard on her.  LBM 2-3 days ago- denies constipation. .  Worried about LE edema  ROS:  Pt denies SOB, abd pain, CP, N/V/C/D, and vision changes   Objective:   No results found. Recent Labs    07/10/22 0714  WBC 7.1  HGB 9.9*  HCT 32.2*  PLT 382    Recent Labs    07/10/22 0714  NA 138  K 4.1  CL 106  CO2 26  GLUCOSE 149*  BUN 17  CREATININE 0.68  CALCIUM 8.7*     Intake/Output Summary (Last 24 hours) at 07/10/2022 1947 Last data filed at 07/10/2022 1845 Gross per 24 hour  Intake 354 ml  Output --  Net 354 ml         Physical Exam: Vital Signs Blood pressure (!) 124/52, pulse 85, temperature 98.8 F (37.1 C), temperature source Oral, resp. rate 18, height 5' (1.524 m), weight 66.1 kg, SpO2 97 %.    General: awake, alert, appropriate, sitting up in bed;  NAD HENT: conjugate gaze; oropharynx moist CV: regular rate; no JVD Pulmonary: CTA B/L; no W/R/R- good air movement GI: soft, NT, ND, (+)BS Psychiatric: appropriate slightly anxious Neurological: Ox3 Skin- a LOT of bruising- purple on legs-  Musculoskeletal:        General: swelling RLE especially proximally > LLE--stable    Cervical back: Normal range of motion.  Skin:    General: Skin is warm.     Comments: Right hip incision  CDI. Surrounding bruising along thigh  Neurological:     Mental Status: She is alert and oriented to person, place, and time.     No nystagmus appreciated    Cranial Nerves: CN 2-12 grossly I ntact    Sensory: No sensory deficit.     Coordination: Coordination normal.     Comments: Right hip limited by pain. Otherwise motor 5/5 UE, 3-4+/5 LLE, Right ADF/APF 4/5. No sensory findings. DTR's 1+      Assessment/Plan: 1. Functional deficits which require 3+ hours per day of interdisciplinary therapy in a comprehensive inpatient rehab setting. Physiatrist is providing close team supervision and 24 hour management of active medical problems listed below. Physiatrist and rehab team continue to assess barriers to discharge/monitor patient progress toward functional and medical goals  Care Tool:  Bathing    Body parts bathed by patient: Right arm, Left arm, Chest, Abdomen, Right upper leg, Left upper leg, Face, Buttocks, Front perineal area, Right lower leg, Left lower leg   Body parts bathed by helper: Right lower leg, Left lower leg     Bathing assist Assist Level: Contact Guard/Touching assist     Upper Body Dressing/Undressing Upper body dressing   What is the patient wearing?: Pull over shirt    Upper body assist Assist Level: Set up assist    Lower Body Dressing/Undressing Lower body dressing      What is the patient wearing?: Pants, Incontinence brief     Lower body  assist Assist for lower body dressing: Minimal Assistance - Patient > 75%     Toileting Toileting    Toileting assist Assist for toileting: Contact Guard/Touching assist     Transfers Chair/bed transfer  Transfers assist  Chair/bed transfer activity did not occur: Safety/medical concerns  Chair/bed transfer assist level: Minimal Assistance - Patient > 75%     Locomotion Ambulation   Ambulation assist   Ambulation activity did not occur: Safety/medical concerns  Assist level: Minimal Assistance - Patient > 75% Assistive device: Walker-rolling Max distance: 50   Walk 10 feet activity   Assist  Walk 10 feet activity did not occur: Safety/medical concerns  Assist level: Minimal Assistance - Patient > 75% Assistive device: Walker-rolling   Walk 50 feet activity   Assist Walk 50 feet with 2 turns activity did not occur: Safety/medical concerns  Assist level: Minimal  Assistance - Patient > 75% Assistive device: Walker-rolling    Walk 150 feet activity   Assist Walk 150 feet activity did not occur: Safety/medical concerns         Walk 10 feet on uneven surface  activity   Assist Walk 10 feet on uneven surfaces activity did not occur: Safety/medical concerns         Wheelchair     Assist Is the patient using a wheelchair?: Yes Type of Wheelchair: Manual Wheelchair activity did not occur: Safety/medical concerns         Wheelchair 50 feet with 2 turns activity    Assist    Wheelchair 50 feet with 2 turns activity did not occur: Safety/medical concerns       Wheelchair 150 feet activity     Assist  Wheelchair 150 feet activity did not occur: Safety/medical concerns       Blood pressure (!) 124/52, pulse 85, temperature 98.8 F (37.1 C), temperature source Oral, resp. rate 18, height 5' (1.524 m), weight 66.1 kg, SpO2 97 %. Medical Problem List and Plan: 1. Functional deficits secondary to a right intertrochanteric hip fx s/p ORIF 07/01/22             -patient may shower starting 8/16 if no drainage from incisions             -ELOS/Goals: 10-14 day, mod I with PT and OT, SLP   -Con't CIR- PT and OT- vertigo tx extrememly helpful per pt 2.  Antithrombotics: -DVT/anticoagulation:  Pharmaceutical: Lovenox - LE dopplers negative--encourage elevation of legs when possible/ TEDS             -antiplatelet therapy: N/a 3. Pain Management: Hydrocodone prn.  4. Mood/Behavior/Sleep: LCSW to follow for evaluation and support.              --has been sleeping well.              -antipsychotic agents: N/A 5. Neuropsych/cognition: This patient is capable of making decisions on her own behalf. 6. Skin/Wound Care: Routine pressure relief measures.  7. Fluids/Electrolytes/Nutrition: Monitor I/O.  .   -emptying without issues currently  -stopped bladder scans  -encourage voiding out of bed when possible 8. Anxiety d/o:  Managed with celexa. Intermittent anxiety attacks  -team providing ego support on a daily basis  -have requested neuropsych visit  -would like to avoid benzos if possible, consider low dose xanax prn potentially  8/21- pt doing ok so far- con't reigmen 9. ABLA:  .   -HGB down to 8.1 8/15--> 8.8  8/18 10. Leucocytosis: wound clean, chest clear. Likely  reactive             -Improved to 8.4 8/15  8/21- WBC 7.1k- doing well 11.  Constipation: improved -continue senna-s.   -large BM 8/19  8/21- LBM 2 days ago- if no BM by tomorrow, will intervene   I spent a total of  38   minutes on total care today- >50% coordination of care- due to prolonged time with pt hearing her concerns about medical issues and her entire medical hx since fall.    LOS: 7 days A FACE TO FACE EVALUATION WAS PERFORMED  Amber Ruiz 07/10/2022, 7:47 PM

## 2022-07-10 NOTE — Progress Notes (Signed)
Physical Therapy Session Note  Patient Details  Name: Amber Ruiz MRN: [id number] Date of Birth: Apr 20, 1941  Today's Date: 07/10/2022 PT Individual Time: 1100-1155 PT Individual Time Calculation (min): 55 min   Short Term Goals: Week 1:  PT Short Term Goal 1 (Week 1): Pt will ambulate >25 ft with LRAD PT Short Term Goal 2 (Week 1): Pt will initiate stair training PT Short Term Goal 3 (Week 1): Pt will perform bed mobility with mod A or better  Skilled Therapeutic Interventions/Progress Updates:    Pt seated in w/c on arrival and agreeable to therapy. Pt reports pain well-controlled on medication at this time, other than some discomfort with mobility. Pt transported to therapy gym for time management and energy conservation. Pt navigated 6" steps with 2 hand rails and min A for balance and cueing. Pt then performed with R handrail and lateral technique with min A. Pt reports this feels more difficult but she is able to d/t having wide hand rails on stairs at home. Pt expressed concern with going to her son's house, stating she would rather return to her own home with a friend staying with her who would only need to leave for 2-4 hours at a time. Pt then transferred to nustep with CGA and performed on level 4 x 12 min for ROM and BLE strength. With repetition, pt able to achieve greater hip ROM without pain. Discussed pt's home set up with shower up stairs and options for needing to navigate stairs less or using shower at her son's home which is on the first floor. Pt returned to w/c in the same manner. Pt propelled w/c with BUE x 120 ft for endurance and functional mobility. Required cues for efficiency and technique. Pt with difficulty navigating in tight space of room, especially while distracted by talking. Pt remained in w/c and was left with all needs in reach and alarm active.   Therapy Documentation Precautions:  Precautions Precautions: Fall, Other (comment) Precaution Comments: watch  BP and SpO2; vertigo Restrictions Weight Bearing Restrictions: Yes RLE Weight Bearing: Weight bearing as tolerated General:      Therapy/Group: Individual Therapy  [name] 07/10/2022, 11:36 AM

## 2022-07-10 NOTE — Progress Notes (Signed)
Occupational Therapy Session Note  Patient Details  Name: Amber Ruiz MRN: [id number] Date of Birth: May 26, 1941  Today's Date: 07/10/2022 OT Individual Time: 2080-2233 OT Individual Time Calculation (min): 45 min    Short Term Goals: Week 1:  OT Short Term Goal 1 (Week 1): Pt will perform all UB self care with close S OT Short Term Goal 2 (Week 1): Pt will perform LB bathing and dressing with min a with AE/DME OT Short Term Goal 3 (Week 1): Pt wll transfer to shower and toilet with DME with min A OT Short Term Goal 4 (Week 1): Pt will stand for grooming sink side with CGA up to 5 min  Skilled Therapeutic Interventions/Progress Updates:    Upon OT arrival, pt semi recumbent in bed reporting "sleeping much harder last night". Pt agreeable to OT treatment. Treatment intervention with a focus on self care retraining. Pt requires verbal cues for sequencing secondary to mild anxiety and possible cognitive deficits. Pt completes Min A for supine to sit transfer and sit to stand transfer with a RW and CGA. Pt ambulates to the bathroom with a RW and CGA and completes shower ADL at the levels below. Pt had no LOB during session and is making progress towards stated OT goals. Pt was left in her w/c with all needs met. Pt reports pain at end of session 7/10 in the R LE.   Therapy Documentation Precautions:  Precautions Precautions: Fall, Other (comment) Precaution Comments: watch BP and SpO2; vertigo Restrictions Weight Bearing Restrictions: Yes RLE Weight Bearing: Weight bearing as tolerated    ADL: Where Assessed-Grooming: Sitting at sink Upper Body Bathing: Supervision/safety Where Assessed-Upper Body Bathing: Shower Lower Body Bathing: Contact guard Where Assessed-Lower Body Bathing: Shower Upper Body Dressing: Setup Where Assessed-Upper Body Dressing: Wheelchair Lower Body Dressing: Moderate assistance Where Assessed-Lower Body Dressing: Teaching laboratory technician: Agricultural engineer Method: Heritage manager: Shower seat with back  Therapy/Group: Individual Therapy  Marvetta Gibbons 07/10/2022, 12:02 PM

## 2022-07-11 MED ORDER — SORBITOL 70 % SOLN
30.0000 mL | Freq: Once | Status: AC
  Administered 2022-07-11: 30 mL via ORAL
  Filled 2022-07-11: qty 30

## 2022-07-11 NOTE — Progress Notes (Signed)
Physical Therapy Session Note  Patient Details  Name: Amber Ruiz MRN: [id number] Date of Birth: 1941/07/31  Today's Date: 07/11/2022 PT Individual Time: 1033-1057 PT Individual Time Calculation (min): 24 min   Short Term Goals: Week 1:  PT Short Term Goal 1 (Week 1): Pt will ambulate >25 ft with LRAD PT Short Term Goal 2 (Week 1): Pt will initiate stair training PT Short Term Goal 3 (Week 1): Pt will perform bed mobility with mod A or better   Skilled Therapeutic Interventions/Progress Updates:      Pt seen sitting in w/c to start - denies any pain while sitting in chair.   Transported in w/c to ortho rehab gym in w/c for time management and energy conservation.  Sit<>stand to RW with CGA. Ambulatory transfer (~41ft) with CGA and RW to Nustep. Cues for safety awareness and safety approach to Nustep to make sure her legs are touching chair prior to sitting.   SetupA required for Nustep. Completed 8 minutes at L4 resistance, using BLE/BUE, encouraging full ROM on R. She maintained cadence of ~40-50 steps/minute, totaling 334 steps.  Returned to w/c in similar manner as above, increased cues for R weight shift and R hip/knee flexion in swing phase of gait cycle.  Pt requesting to remain seated in w/c at end of session. Made comfortable, all needs met, call bell in reach.   Therapy Documentation Precautions:  Precautions Precautions: Fall, Other (comment) Precaution Comments: watch BP and SpO2; vertigo Restrictions Weight Bearing Restrictions: (P) Yes RLE Weight Bearing: (P) Weight bearing as tolerated General:     Therapy/Group: Individual Therapy  Alger Simons 07/11/2022, 7:49 AM

## 2022-07-11 NOTE — Progress Notes (Signed)
Physical Therapy Session Note  Patient Details  Name: Amber Ruiz MRN: [id number] Date of Birth: [birth year]  Today's Date: 07/11/2022 PT Individual Time: 4944-9675 PT Individual Time Calculation (min): 44 min   Short Term Goals: Week 2:  PT Short Term Goal 1 (Week 2): =LTGs d/t ELOS  Skilled Therapeutic Interventions/Progress Updates:    pt received in w/c and agreeable to therapy. Pt reports 8/10 pain, reports she didn't have pain medication recently. Rest and positioning as needed. Pt ambulates x200 ft with close supervision and w/c follow. Demoes decr hip/knee flexion and antalgic gait. Pt reports fatigue and required standing rest breaks after ~80 ft but was able to continue with encouragement. Pt performed Stand pivot transfer with RW to nustep. Cues for safety as pt abandoned RW early and pivot to sit instead of turning all the way to seat. Discussed safety awareness and being sure to feel seat against legs. Pt then utilized nustep x 14 min at level 6 for global strength and endurance and improved ROM in BLE. Pt was transported back to room and remained in w/c, was left with all needs in reach and alarm active.   Therapy Documentation Precautions:  Precautions Precautions: Fall, Other (comment) Precaution Comments: watch BP and SpO2; vertigo Restrictions Weight Bearing Restrictions: (P) Yes RLE Weight Bearing: Weight bearing as tolerated General:       Therapy/Group: Individual Therapy  [name] 07/11/2022, 3:13 PM

## 2022-07-11 NOTE — Progress Notes (Signed)
Patient ID: Amber Ruiz, female   DOB: 03/20/1941, 81 y.o.   MRN: [id number]  SW met with pt in room to provide updates from team conference, and d/c date remains 8/29. When discussing d/c plan. She is amenable to going to her son's home, and open to any suggestions her son has.   1532-SW spoke with pt son Tommy to provide updates from team conference. He is not able to provide 24/7 care due to work and other obligations. SW informed will ask medical team about intermittent supervision if this were appropriate.  Discussed alternate options such as ALF/independent living in the long term. However, pt is currently to d/c to home with supervision needs and HH. SW also discussed options such as using family friend to help with supervision or hiring private aide.  He intends to speak with his mother about d/c plan and location.   [name], MSW, LCSWA Office: [phone] Cell: [phone] Fax: [phone]    

## 2022-07-11 NOTE — Patient Care Conference (Signed)
Inpatient RehabilitationTeam Conference and Plan of Care Update Date: 07/11/2022   Time: 11:32 AM   Patient Name: Amber Ruiz      Medical Record Number: [id number]  Date of Birth: 1941-03-23 Sex: Female         Room/Bed: 4M11C/4M11C-01 Payor Info: Payor: MEDICARE / Plan: MEDICARE PART A AND B / Product Type: *No Product type* /    Admit Date/Time:  07/03/2022  5:49 PM  Primary Diagnosis:  Femur fracture, right [facility])  Hospital Problems: Principal Problem:   Femur fracture, right [facility])    Expected Discharge Date: Expected Discharge Date: 07/18/22  Team Members Present: Physician leading conference: Dr. Genice Rouge Social Worker Present: Cecile Sheerer, LCSWA Nurse Present: Other (comment) Vedia Pereyra, RN) PT Present: Bernie Covey, PT OT Present: Valetta Fuller, OT SLP Present: Colin Benton, SLP PPS Coordinator present : Edson Snowball, PT     Current Status/Progress Goal Weekly Team Focus  Bowel/Bladder   Conintent of b/b; LBM: 8/20  remain continent of b/b  assist with toileting needs prn   Swallow/Nutrition/ Hydration             ADL's   CGA for self care UB/LB, CGA shower and toilet transfers with RW  S  progressing to S/mod I BADL's and S IADL's   Mobility   CGA-close supervision, limited by pain and intitiation. Stairs with min A. CG performed vestibular assessment and treated, minimal symptoms since and will continue to monitor.  supervision  Pain, d/c plan, gait, stairs   Communication             Safety/Cognition/ Behavioral Observations  min A - pt presenting with cognitive-communication deficits of unknown etiology, particularly in the areas of attention and short-term memory. Pt feels her memory has worsened since being in the hospital.  mod I  memory strategies, functional problem solving, medicatoin/financial management   Pain   c/o pain to surgical site (LLE); prn norco  pain level <4/10  assess pain QS and prn   Skin   surgical incision  LLE-OTA  remain free of new skin breakdown/infection  assess skin QS and prn     Discharge Planning:  D/c location pending level of care needs. Pt likely to d/c to home with intermittent support from children. Fam edu scheduled for 8/22 1pm-4pm with her son. SW will confirm there are no barriers to discharge.   Team Discussion: Right femur fx. Continent of B/B LBM 08/20. Pain improving. Incision CDI. Family education today. Patient on target to meet rehab goals: Yes, disposition pending level of care.  *See Care Plan and progress notes for long and short-term goals.   Revisions to Treatment Plan:  None at this time  Teaching Needs: Medications, safety, gait/transfer training, etc.  Current Barriers to Discharge: Decreased caregiver support and Home enviroment access/layout  Possible Resolutions to Barriers: Family education, medication education, order recommended DME     Medical Summary Current Status: LBM 8/20; sorbitol today; no sleep issues; incision skin glue; continent  Barriers to Discharge: Decreased family/caregiver support;Behavior;Home enviroment access/layout;Medical stability;Weight bearing restrictions;Wound care  Barriers to Discharge Comments: family training was supposed to be today- didn'tcome for training/Son; didn't arrange with family/friends at all Possible Resolutions to [employer] Focus: limiters anxiety- no plan for dispo; needs family training- vertigo which has finally improved some; goals supervision; mainlyCGA right now; cognition min A; goals mod I- d/c- 8/29   Continued Need for Acute Rehabilitation Level of Care: The patient requires daily medical management by a physician  with specialized training in physical medicine and rehabilitation for the following reasons: Direction of a multidisciplinary physical rehabilitation program to maximize functional independence : Yes Medical management of patient stability for increased activity during  participation in an intensive rehabilitation regime.: Yes Analysis of laboratory values and/or radiology reports with any subsequent need for medication adjustment and/or medical intervention. : Yes   I attest that I was present, lead the team conference, and concur with the assessment and plan of the team.   Jearld Adjutant 07/11/2022, 5:05 PM

## 2022-07-11 NOTE — Progress Notes (Signed)
Physical Therapy Weekly Progress Note  Patient Details  Name: Amber Ruiz MRN: [id number] Date of Birth: 12/22/1940  Beginning of progress report period: July 04, 2022 End of progress report period: July 11, 2022  Today's Date: 07/11/2022 PT Individual Time: 0915-1000 PT Individual Time Calculation (min): 45 min   Patient has met 3 of 3 short term goals.  Pt is progressing toward LTGs. Pt performs bed mobility with Min A for RLE management. Pt is able to stand with CGA, but requires min A at times d/t pain/fatigue. Pt is able to navigate up to 4 stairs with min A, but will need to navigate 6-8 at d/c. Pt continues to be limited by pain and cognitive/memory deficits. D/t cognition, pt will require supervision at discharge for safety.   Patient continues to demonstrate the following deficits muscle weakness, decreased cardiorespiratoy endurance, decreased initiation, decreased attention, decreased problem solving, decreased safety awareness, and decreased memory, and decreased standing balance and decreased balance strategies and therefore will continue to benefit from skilled PT intervention to increase functional independence with mobility.  Patient progressing toward long term goals..  Continue plan of care.  PT Short Term Goals Week 1:  PT Short Term Goal 1 (Week 1): Pt will ambulate >25 ft with LRAD PT Short Term Goal 1 - Progress (Week 1): Met PT Short Term Goal 2 (Week 1): Pt will initiate stair training PT Short Term Goal 2 - Progress (Week 1): Met PT Short Term Goal 3 (Week 1): Pt will perform bed mobility with mod A or better PT Short Term Goal 3 - Progress (Week 1): Met Week 2:  PT Short Term Goal 1 (Week 2): =LTGs d/t ELOS  Skilled Therapeutic Interventions/Progress Updates:    pt received in bed and agreeable to therapy. Pt reports feeling sleepy this AM. Opened blinds and turned on lights to assist with arousal, pt demoed improving wakefulness with time and activity.  Reports some pain with initial mobility, unrated, but reports great improvement with increased mobility.   Supine>sit from flat bed without rails with min A. Attempted using gait belt to move RLE, but pt reports this is too painful at this time. Sit to stand with min A from bed to RW with cuing.  Pt ambulated to bathroom with CGA and RW, very slow speed and demoing limited hip/knee flexion. Pt had continent bladder void, supervision for 3/3 toileting tasks. Pt then ambulated to sink in the same manner. Required cues x 2 to remember where she was going and then x 1 questioning cue at sink to recall task at hand. Pt then stood at sink with supervision for oral hygiene and while combing hair, no cues for these tasks.   Pt then doffed shirt and donned clean shirt at w/c level with supervision. Min a to thread pants in sitting, CGA while pulling over hips. Pt remained in w/c at end of session and was left with all needs in reach and alarm active.   Therapy Documentation Precautions:  Precautions Precautions: Fall, Other (comment) Precaution Comments: watch BP and SpO2; vertigo Restrictions Weight Bearing Restrictions: (P) Yes RLE Weight Bearing: (P) Weight bearing as tolerated General:      Therapy/Group: Individual Therapy  Mickel Fuchs 07/11/2022, 10:36 AM

## 2022-07-11 NOTE — Progress Notes (Signed)
Occupational Therapy Weekly Progress Note  Patient Details  Name: Amber Ruiz MRN: [id number] Date of Birth: [birth year]  Beginning of progress report period: July 04, 2022 End of progress report period: July 11, 2022  Today's Date: 07/11/2022 OT Individual Time: 1302-1400 OT Individual Time Calculation (min): 58 min    Patient has met 4 of 4 short term goals.  Patient with improved pain, improved stand tolerance, stand balance, and improved awareness of adaptive equipment/ DME needs.  Patient continues to demonstrate the following deficits: muscle weakness and muscle joint tightness, peripheral, and decreased standing balance and decreased balance strategies and therefore will continue to benefit from skilled OT intervention to enhance overall performance with BADL.  Patient progressing toward long term goals..  Continue plan of care.  OT Short Term Goals Week 1:  OT Short Term Goal 1 (Week 1): Pt will perform all UB self care with close S OT Short Term Goal 1 - Progress (Week 1): Met OT Short Term Goal 2 (Week 1): Pt will perform LB bathing and dressing with min a with AE/DME OT Short Term Goal 2 - Progress (Week 1): Met OT Short Term Goal 3 (Week 1): Pt wll transfer to shower and toilet with DME with min A OT Short Term Goal 3 - Progress (Week 1): Met OT Short Term Goal 4 (Week 1): Pt will stand for grooming sink side with CGA up to 5 min OT Short Term Goal 4 - Progress (Week 1): Met  Week 2:  Working toward [facility]  Skilled Therapeutic Interventions/Progress Updates:    Patient received seated in wheelchair visiting with NT.  Patient agreeable to OT session.  Patient declined shower, fully dressed in clean clothing.  Patient indicates that she will discharge to her son's house.  Son not present for family education this session - perhaps miscommunication. Patient very familiar with son's home and has stayed there.  Transported patient to ADL apartment to practice furniture  transfers - regular bed and also shower stall transfer options.  Patient able to clear 3 inch lip without difficulty with increased time.   Patient transported back to room and stayed up in wheelchair for SLP session immediately following.  Personal items and call bell within reach.    Therapy Documentation Precautions:  Precautions Precautions: Fall, Other (comment) Precaution Comments: watch BP and SpO2; vertigo Restrictions Weight Bearing Restrictions: (P) Yes RLE Weight Bearing: Weight bearing as tolerated  Pain: Pain Assessment Pain Scale: 0-10 Pain Score: 0-No pain    Therapy/Group: Individual Therapy  Mariah Milling 07/11/2022, 2:53 PM

## 2022-07-11 NOTE — Progress Notes (Signed)
Speech Language Pathology Daily Session Note  Patient Details  Name: Amber Ruiz MRN: [id number] Date of Birth: Jan 30, 1941  Today's Date: 07/11/2022 SLP Individual Time: 7169-6789 SLP Individual Time Calculation (min): 41 min  Short Term Goals: Week 1: SLP Short Term Goal 1 (Week 1): Patient will recall and describe up to 4 beneficial compensatory memory startegies with sup A verbal cues SLP Short Term Goal 2 (Week 1): Patient will complete mild-to-moderately complex problem solving effectively with min A verbal cues SLP Short Term Goal 3 (Week 1): Pt will complete medicaition and financial management tasks effectively with min A verbal cues  Skilled Therapeutic Interventions: Skilled ST treatment focused on cognitive goals.Pt received upright in wheelchair on arrival and appeared in good spirits today. Pt was scheduled for family education however no family present. Pt showed therapist where she had written a few notes in memory notebook between yesterday and today's session. Pt continues to perseverate on confusion experienced when transferred to CIR. This has been discussed in detail each session and patient does not appear to have full awareness that this has been previously discussed during several occasions. Pt was mildly verbose and benefited from intermittent verbal redirection throughout session. Pt was known to occasionally lose her train of thought mid-conversation. Pt supported that memory issues have been ongoing at home and have interfered with efficiency and procedural steps involved with some ADLs/iADLs (e.g., remembering to add laundry detergent to washing machine, adding coffee grounds w/ coffee maker).   SLP provided further education on strategies to maximize attention and memory. SLP facilitated use of strategies during conversational working memory task in which pt recalled 9/14 (64%) novel details at independent level for immediate recall, progressing to 12/14 (85%) with  sup-to-min A verbal cues.   Patient was left in bed with alarm activated and immediate needs within reach at end of session. Continue per current plan of care.       Pain Pain Assessment Pain Scale: 0-10 Pain Score: 0-No pain  Therapy/Group: Individual Therapy  [name] 07/11/2022, 2:12 PM

## 2022-07-11 NOTE — Progress Notes (Signed)
PROGRESS NOTE   Subjective/Complaints:  Pt reports feeling rough again this AM- slept real well, so didn't wake up for pain meds.  Took pain meds ~ 9-10 pm last night and was seen at 7:30am- no meds during that time.   Doesn't remember LBM, but yesterday said was 2-3 days.   ROS:  Pt denies SOB, abd pain, CP, N/V/(+) C/D, and vision changes   Objective:   No results found.  Recent Labs    07/10/22 0714  WBC 7.1  HGB 9.9*  HCT 32.2*  PLT 382   Recent Labs    07/10/22 0714  NA 138  K 4.1  CL 106  CO2 26  GLUCOSE 149*  BUN 17  CREATININE 0.68  CALCIUM 8.7*    Intake/Output Summary (Last 24 hours) at 07/11/2022 0827 Last data filed at 07/11/2022 0814 Gross per 24 hour  Intake 356 ml  Output --  Net 356 ml        Physical Exam: Vital Signs Blood pressure (!) 143/59, pulse 72, temperature 98 F (36.7 C), resp. rate 16, height 5' (1.524 m), weight 66.1 kg, SpO2 94 %.     General: awake, alert, appropriate, sitting up in bed; NAD HENT: conjugate gaze; oropharynx moist CV: regular rate; no JVD Pulmonary: CTA B/L; no W/R/R- good air movement GI: soft, NT, ND, (+)BS Psychiatric: appropriate but still somewhat anxious Neurological: Ox3 but sleepy Musculoskeletal:        General: swelling RLE especially proximally > LLE--stable    Cervical back: Normal range of motion.  Skin:    General: Skin is warm.     Comments: Right hip incision  CDI. Surrounding bruising along thigh R>L- outer and inner thigh Neurological:     Mental Status: She is alert and oriented to person, place, and time.     No nystagmus appreciated    Cranial Nerves: CN 2-12 grossly I ntact    Sensory: No sensory deficit.     Coordination: Coordination normal.     Comments: Right hip limited by pain. Otherwise motor 5/5 UE, 3-4+/5 LLE, Right ADF/APF 4/5. No sensory findings. DTR's 1+     Assessment/Plan: 1. Functional deficits  which require 3+ hours per day of interdisciplinary therapy in a comprehensive inpatient rehab setting. Physiatrist is providing close team supervision and 24 hour management of active medical problems listed below. Physiatrist and rehab team continue to assess barriers to discharge/monitor patient progress toward functional and medical goals  Care Tool:  Bathing    Body parts bathed by patient: Right arm, Left arm, Chest, Abdomen, Right upper leg, Left upper leg, Face, Buttocks, Front perineal area, Right lower leg, Left lower leg   Body parts bathed by helper: Right lower leg, Left lower leg     Bathing assist Assist Level: Contact Guard/Touching assist     Upper Body Dressing/Undressing Upper body dressing   What is the patient wearing?: Pull over shirt    Upper body assist Assist Level: Set up assist    Lower Body Dressing/Undressing Lower body dressing      What is the patient wearing?: Pants, Incontinence brief     Lower body assist Assist for  lower body dressing: Minimal Assistance - Patient > 75%     Toileting Toileting    Toileting assist Assist for toileting: Contact Guard/Touching assist     Transfers Chair/bed transfer  Transfers assist  Chair/bed transfer activity did not occur: Safety/medical concerns  Chair/bed transfer assist level: Minimal Assistance - Patient > 75%     Locomotion Ambulation   Ambulation assist   Ambulation activity did not occur: Safety/medical concerns  Assist level: Minimal Assistance - Patient > 75% Assistive device: Walker-rolling Max distance: 50   Walk 10 feet activity   Assist  Walk 10 feet activity did not occur: Safety/medical concerns  Assist level: Minimal Assistance - Patient > 75% Assistive device: Walker-rolling   Walk 50 feet activity   Assist Walk 50 feet with 2 turns activity did not occur: Safety/medical concerns  Assist level: Minimal Assistance - Patient > 75% Assistive device:  Walker-rolling    Walk 150 feet activity   Assist Walk 150 feet activity did not occur: Safety/medical concerns         Walk 10 feet on uneven surface  activity   Assist Walk 10 feet on uneven surfaces activity did not occur: Safety/medical concerns         Wheelchair     Assist Is the patient using a wheelchair?: Yes Type of Wheelchair: Manual Wheelchair activity did not occur: Safety/medical concerns         Wheelchair 50 feet with 2 turns activity    Assist    Wheelchair 50 feet with 2 turns activity did not occur: Safety/medical concerns       Wheelchair 150 feet activity     Assist  Wheelchair 150 feet activity did not occur: Safety/medical concerns       Blood pressure (!) 143/59, pulse 72, temperature 98 F (36.7 C), resp. rate 16, height 5' (1.524 m), weight 66.1 kg, SpO2 94 %. Medical Problem List and Plan: 1. Functional deficits secondary to a right intertrochanteric hip fx s/p ORIF 07/01/22             -patient may shower starting 8/16 if no drainage from incisions             -ELOS/Goals: 10-14 day, mod I with PT and OT  Con't CIR- PT and OT- team conference today to determine length of stay/f/u on progress 2.  Antithrombotics: -DVT/anticoagulation:  Pharmaceutical: Lovenox - LE dopplers negative--encourage elevation of legs when possible/ TEDS             -antiplatelet therapy: N/a 3. Pain Management: Hydrocodone prn.  4. Mood/Behavior/Sleep: LCSW to follow for evaluation and support.              -8/22- sleeping well- con't regimen              -antipsychotic agents: N/A 5. Neuropsych/cognition: This patient is capable of making decisions on her own behalf. 6. Skin/Wound Care: Routine pressure relief measures.  7. Fluids/Electrolytes/Nutrition: Monitor I/O.  .   -emptying without issues currently  -stopped bladder scans  -encourage voiding out of bed when possible 8. Anxiety d/o: Managed with celexa. Intermittent anxiety  attacks  -team providing ego support on a daily basis  -have requested neuropsych visit  -would like to avoid benzos if possible, consider low dose xanax prn potentially  8/21- pt doing ok so far- con't reigmen 9. ABLA:  .   -HGB down to 8.1 8/15--> 8.8  8/18 10. Leucocytosis: wound clean, chest clear. Likely reactive             -  Improved to 8.4 8/15  8/21- WBC 7.1k- doing well 11.  Constipation: improved -continue senna-s.   -large BM 8/19  8/21- LBM 2 days ago- if no BM by tomorrow, will intervene  8/22- will give Sorbitol after therapy 30cc at 3pm- still hasn't gone  I spent a total of  39  minutes on total care today- >50% coordination of care- due to team conference and discussion of bowels with pt and staff   LOS: 8 days A FACE TO FACE EVALUATION WAS PERFORMED  Amber Ruiz 07/11/2022, 8:27 AM

## 2022-07-12 DIAGNOSIS — S72001S Fracture of unspecified part of neck of right femur, sequela: Secondary | ICD-10-CM

## 2022-07-12 DIAGNOSIS — F411 Generalized anxiety disorder: Secondary | ICD-10-CM

## 2022-07-12 LAB — URINALYSIS, ROUTINE W REFLEX MICROSCOPIC
Bilirubin Urine: NEGATIVE
Glucose, UA: NEGATIVE mg/dL
Hgb urine dipstick: NEGATIVE
Ketones, ur: NEGATIVE mg/dL
Nitrite: NEGATIVE
Protein, ur: NEGATIVE mg/dL
Specific Gravity, Urine: 1.008 (ref 1.005–1.030)
pH: 6 (ref 5.0–8.0)

## 2022-07-12 MED ORDER — SORBITOL 70 % SOLN
45.0000 mL | Freq: Once | Status: AC
  Administered 2022-07-12: 45 mL via ORAL
  Filled 2022-07-12: qty 60

## 2022-07-12 NOTE — Progress Notes (Signed)
Physical Therapy Session Note  Patient Details  Name: Margurete Guaman MRN: [id number] Date of Birth: 07/17/1941  Today's Date: 07/12/2022 PT Individual Time: 1105-1210 PT Individual Time Calculation (min): 65 min   Short Term Goals: Week 2:  PT Short Term Goal 1 (Week 2): =LTGs d/t ELOS  Skilled Therapeutic Interventions/Progress Updates: Pt presented in recliner agreeable to therapy. Pt denies pain at rest, increased with movement with rest breaks provided as needed throughout session. Pt hyperverbal throughout session but was able to be redirected easily. Performed Sit to stand with supervision and ambulated to nsg station ~153f with RW and light CGA fading to supervision. Pt encouraged to decrease weight bearing through BUE as able during all bouts of ambulation. Pt transported remaining distance to rehab gym for energy conservation. Participated in standing activities including hip abd/add, standing marches, toe taps to 2in bloc performed x 10 bilaterally. Pt also performed step ups to 4in step x 5 for sequencing in preparation for stairs. Pt transported back to room and agreeable to attempt to use toilet prior to end of session for UA sample.  Performed ambulatory transfer to toilet with RW and CGA, pt able to perform toilet transfers and clothing management with CGA (+ urinary void). Once completed pt able to perform peri-care and clothing management with supervision and ambulated to reliner with RW and CGA. Pt left in recliner at end of session resting comfortably with lunch tray set up and current needs met.      Therapy Documentation Precautions:  Precautions Precautions: Fall, Other (comment) Precaution Comments: watch BP and SpO2; vertigo Restrictions Weight Bearing Restrictions: Yes RLE Weight Bearing: Weight bearing as tolerated General:   Vital Signs: Therapy Vitals Temp: 98.3 F (36.8 C) Temp Source: Oral Pulse Rate: 92 Resp: 16 BP: (!) 130/55 Patient Position (if  appropriate): Lying Oxygen Therapy SpO2: 97 % O2 Device: Room Air Pain:   Mobility:   Locomotion :    Trunk/Postural Assessment :    Balance:   Exercises:   Other Treatments:      Therapy/Group: Individual Therapy  [facility] 07/12/2022, 2:59 PM

## 2022-07-12 NOTE — Progress Notes (Signed)
Physical Therapy Session Note  Patient Details  Name: Amber Ruiz MRN: [id number] Date of Birth: [birth year]  Today's Date: 07/12/2022 PT Individual Time: 1322-1408 PT Individual Time Calculation (min): 46 min   Short Term Goals: Week 2:  PT Short Term Goal 1 (Week 2): =LTGs d/t ELOS  Skilled Therapeutic Interventions/Progress Updates:    Pt received in recliner and agreeable to therapy.  Pt reports pain with mobility that relieves with rest, no intervention required. Pt performed ambulatory transfer to w/c with CGA and RW. Pt with stiffness related slow speed that improves with mobility. Pt transported to therapy gym for time management and energy conservation. Pt navigated stairs 2 x 4 with seated rest break. Pt uses lateral technique with R hand rail to mimic home environment. During seated rest break, reviewed VOR habituation exercises. Pt did not recall, so wrote them down on returning to pt's room. On second trial, pt reported light headedness at top of stairs. Pt sat to rest for several minutes and then felt better. Unsure if symptoms related to vertigo, as pt did not have orthostatic symptoms. Pt descended stairs without incident and then returned to room. Pt returned to recliner with CGA ambulatory transfer and was left with all needs in reach and alarm active.   Therapy Documentation Precautions:  Precautions Precautions: Fall, Other (comment) Precaution Comments: watch BP and SpO2; vertigo Restrictions Weight Bearing Restrictions: Yes RLE Weight Bearing: Weight bearing as tolerated General:       Therapy/Group: Individual Therapy  [name] 07/12/2022, 3:22 PM

## 2022-07-12 NOTE — Progress Notes (Signed)
Speech Language Pathology Weekly Progress and Session Note  Patient Details  Name: Amber Ruiz MRN: [id number] Date of Birth: [birth year]  Beginning of progress report period: July 05, 2022 End of progress report period: July 12, 2022  Today's Date: 07/12/2022 SLP Individual Time: 0830-0930 SLP Individual Time Calculation (min): 60 min  Short Term Goals: Week 1: SLP Short Term Goal 1 (Week 1): Patient will recall and describe up to 4 beneficial compensatory memory startegies with sup A verbal cues SLP Short Term Goal 1 - Progress (Week 1): Not met SLP Short Term Goal 2 (Week 1): Patient will complete mild-to-moderately complex problem solving effectively with min A verbal cues SLP Short Term Goal 2 - Progress (Week 1): Met SLP Short Term Goal 3 (Week 1): Pt will complete medicaition and financial management tasks effectively with min A verbal cues SLP Short Term Goal 3 - Progress (Week 1): Met    New Short Term Goals: Week 2: SLP Short Term Goal 1 (Week 2): STGs=LTGs due to ELOS  Weekly Progress Updates: Patient has made functional gains and has met 2 of 3 LTGs this admission. Currently, patient requires overall supervision level verbal cues to Mod I to complete functional and mildly complex tasks safely in regards to problem solving. However, patient continues to demonstrate overall mild-moderate deficits in short-term recall with decreased recall and carryover of compensatory strategies. Patient's function can also be impacted by verbosity but patient can be easily redirected. Patient and family education ongoing. Patient would benefit from continued skilled SLP intervention to maximize her cognitive functioning and overall functional independence prior to discharge.     Intensity: Minumum of 1-2 x/day, 30 to 90 minutes Frequency: 1 to 3 out of 7 days Duration/Length of Stay: 08/29 Treatment/Interventions: Cognitive remediation/compensation;Internal/external aids;Functional  tasks;Patient/family education;Therapeutic Activities   Daily Session  Skilled Therapeutic Interventions:  Skilled treatment session focused on cognitive goals. Upon arrival, patient was awake in bed but appeared lethargic. Patient reported "sleeping hard" last night and did not recall events from the morning (multiple attempts to wake her to eat breakfast). Patient was incontinent of urine which had soaked through the bed. Patient transferred to the EOB with extra time and assistance with managing her RLE. TEDs, socks, brief and pants were donned prior to standing. Patient stood with Min A and prei care was performed. Patient transferred to the recliner to maximize arousal. Patient had already taken her morning medication and reported nausea. Encouraged patient to consume her breakfast tray. With extra time and Max encouragement, patient agreeable to consuming her yogurt. Mod verbal cues were needed for alternating attention between self-feeding and functional conversation that focused on anticipatory awareness. SLP provided overall Mod question cues for appropriate planning and awareness. Patient was tangential and verbose throughout requiring Mod verbal cues for redirection. Patient left upright in recliner with alarm on and all needs within reach. Continue with current plan of care.     Pain No/Denies Pain   Therapy/Group: Individual Therapy  Amber Ruiz 07/12/2022, 6:33 AM

## 2022-07-12 NOTE — Progress Notes (Signed)
Patient ID: Amber Ruiz, female   DOB: 03-Aug-1941, 81 y.o.   MRN: [id number]  SW spoke with pt son Amber Ruiz to provide updates from therapy team in which it would be appropriate for intermittent supervision with short bouts in between/frequent check-ins related to cognition. Pt son intends to speak with his sister to decide on the best plan of care. Likely pt will d/c to home with a friend that can be with her majority of the day and stay in her home at night. SW asked for follow-up tomorrow on decision, if not, Monday in efforts to plan for best level of care.   Cecile Sheerer, MSW, LCSWA Office: [phone] Cell: [phone] Fax: [phone]

## 2022-07-12 NOTE — Progress Notes (Signed)
Occupational Therapy Session Note  Patient Details  Name: Amber Ruiz MRN: [id number] Date of Birth: 1941-01-19  Today's Date: 07/12/2022 OT Individual Time: 0935-1030 OT Individual Time Calculation (min): 55 min    Short Term Goals: Week 1:  OT Short Term Goal 1 (Week 1): Pt will perform all UB self care with close S OT Short Term Goal 1 - Progress (Week 1): Met OT Short Term Goal 2 (Week 1): Pt will perform LB bathing and dressing with min a with AE/DME OT Short Term Goal 2 - Progress (Week 1): Met OT Short Term Goal 3 (Week 1): Pt wll transfer to shower and toilet with DME with min A OT Short Term Goal 3 - Progress (Week 1): Met OT Short Term Goal 4 (Week 1): Pt will stand for grooming sink side with CGA up to 5 min OT Short Term Goal 4 - Progress (Week 1): Met  Skilled Therapeutic Interventions/Progress Updates:  Skilled OT intervention completed with focus on functional endurance within a shower context. Pt received seated in recliner, no c/o pain just "stiffness in RLE" however improved with warmth of shower. Of note- (per SLP also) pt with slight confusion this morning, requiring repeated cues and instruction during ADLs. Did notify nurse of cloudy urine, pt report of burning sensation when voiding during session, with recommendation of completing UA to rule out UTI.  Completed all transfers with CGA using RW at the ambulatory level however completed stand pivots towards later session with increased fatigue with CGA-min A.  Ambulated to [facility] over toilet with cues needed for hip positioning. Ambulated to shower seat in shower with use of grab bars with min A for RLE advancement. Completed all bathing at the sit > stand level with min A for BLE; would benefit from [facility] sponge to maximize independence. Min A stand pivot to w/c. UB dressing with set up A, min A for LB dressing. Combed hair at sink with mod I. Stand pivot with CGA to recliner, with pt left upright in recliner, with items on  table for oral care, and all immediate needs met at end of session.   Therapy Documentation Precautions:  Precautions Precautions: Fall, Other (comment) Precaution Comments: watch BP and SpO2; vertigo Restrictions Weight Bearing Restrictions: Yes RLE Weight Bearing: Weight bearing as tolerated    Therapy/Group: Individual Therapy  Blase Mess, MS, OTR/L  07/12/2022, 10:35 AM

## 2022-07-12 NOTE — Consult Note (Signed)
Neuropsychological Consultation   Patient:   Amber Ruiz   DOB:   [birth year]  MR Number:  [id number]  Location:  [facility] [address] [facility] [place] [id number] [employer] [place] [address] Dept: [place]: [phone]           Date of Service:   07/12/2022  Start Time:   3 PM End Time:   4 PM  Provider/Observer:  Ilean Skill, Psy.D.       Clinical Neuropsychologist       Billing Code/Service: [phone]  Chief Complaint:    Amber Ruiz is an 81 year old female who has a past history of anxiety and OCD symptoms, anemia, vertigo followed by [facility].  Patient was admitted after a fall onto her right hip at night prior and she stayed on the floor between 10 PM and 11 AM until f her son found her.  Patient was admitted the next day on 07/01/2022 for work-up.  Patient was found to have a right IT femur fracture and underwent ORIF on 8/13.  Patient with ongoing issues with pain and decrease in activity tolerance as well as difficulty advancing right lower extremity.  CIR was recommended due to functional decline.  Reason for Service:  Patient was referred for neuropsychological consultation.  She has a long history of anxiety which has become exacerbated with the recent right femoral fracture post fall.  Patient was on the ground in her house and unable to get up from 10 PM to 11 AM the next morning.  Below is the HPI for the current admission.  HPI:  Amber Ruiz is an 81 year old female with history of OCD, anxiety, anemia, vertigo in the past; who was admitted after reports of fall onto her right hip the night PTA, she stayed on the floor from 10 pm to 11 am till family found her. She was admitted next day on 07/01/22 for work up. She was found to have right IT femur fracture and underwent ORIF on 08/13 by Dr. Doreatha Martin. Post op WBAT and on Lovenox for DVT prophylaxis. Follow up labs show ABLA which  is being monitored. Leucocytosis with rise in WBC to 12.9 being monitored.  PT evaluations done revealing right nystagmus which was treated with modified L Dix Hallpike maneuvers.  OT evaluation completed today revealing pain, decrease in activity tolerance as well as difficulty advancing RLE. CIR recommended due to functional declein.   Current Status:  Patient was awake and alert and quite talkative.  Her mental status was good with good cognition and good memory for the most part.  Patient was readily able to provide in-depth discussions of past events and interactions with family and life experiences.  Patient displayed good expressive and receptive language.  Patient acknowledges some anxiety but for the most part she feels like she is coping well given the circumstances.  Patient has some worry about what her long-term situation is as far as living now that she has fallen and broken her hip.  Patient remembers the fall quite vividly even though she had struck her head before falling to the ground as she lost her balance.  Patient equates this to the vertigo that she has been dealing with that at one point had improved but had been progressively worsening more recently.  Patient had gone through lots of attempted therapeutic interventions including vestibular therapies and PT.  Patient reports that vertigo has been relatively well managed well on the  inpatient unit.  Behavioral Observation: Amber Ruiz  presents as a 81 y.o.-year-old Right handed Caucasian Female who appeared her stated age. her dress was Appropriate and she was Well Groomed and her manners were Appropriate to the situation.  her participation was indicative of Redirectable behaviors.  There were physical disabilities noted.  she displayed an appropriate level of cooperation and motivation.     Interactions:    Active Redirectable  Attention:   abnormal and patient tended to get sidetracked on various descriptions and stories of  her life.  Memory:   within normal limits; recent and remote memory intact  Visuo-spatial:  not examined  Speech (Volume):  normal  Speech:   normal; normal  Thought Process:  Coherent and Tangential  Though Content:  WNL; not suicidal and not homicidal  Orientation:   person, place, time/date, and situation  Judgment:   Fair  Planning:   Fair  Affect:    Anxious  Mood:    Anxious  Insight:   Good  Intelligence:   normal  Marital Status/Living: Patient's husband passed away 5 years ago and she has been a life With regular contact with her son and daughter.   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  Patient continues to take her medication for anxiety and depressive symptoms that were in place prior to admission and include citalopram.  Family Med/Psych History:  Family History  Problem Relation Age of Onset   Heart attack Father     Impression/DX:  Amber Ruiz is an 81 year old female who has a past history of anxiety and OCD symptoms, anemia, vertigo followed by [facility].  Patient was admitted after a fall onto her right hip at night prior and she stayed on the floor between 10 PM and 11 AM until f her son found her.  Patient was admitted the next day on 07/01/2022 for work-up.   Patient was found to have a right IT femur fracture and underwent ORIF on 8/13.  Patient with ongoing issues with pain and decrease in activity tolerance as well as difficulty advancing right lower extremity.  CIR was recommended due to functional decline.  Patient was awake and alert and quite talkative.  Her mental status was good with good cognition and good memory for the most part.  Patient was readily able to provide in-depth discussions of past events and interactions with family and life experiences.  Patient displayed good expressive and receptive language.  Patient acknowledges some anxiety but for the most part she feels like she is coping well given the circumstances.  Patient has some worry about what her long-term situation is as far as living now that she has fallen and broken her hip.  Patient remembers the fall quite vividly even though she  had struck her head before falling to the ground as she lost her balance.  Patient equates this to the vertigo that she has been dealing with that at one point had improved but had been progressively worsening more recently.  Patient had gone through lots of attempted therapeutic interventions including vestibular therapies and PT.  Patient reports that vertigo has been relatively well managed well on the inpatient unit.  Disposition/Plan:  Today we worked on adjustment and coping skills around her anxiety and helping her work on how to appropriately plan for immediate changes right after discharge and long-term issues that she and her family have to decide as far as where she lives.  Diagnosis:    Anxiety         Electronically Signed   _______________________ Arley Phenix, Psy.D. Clinical Neuropsychologist

## 2022-07-12 NOTE — Progress Notes (Signed)
PROGRESS NOTE   Subjective/Complaints:  Pt reports still no BM- denies constipation, however admits it's "been awhile".  Just woke up- pain moderate-   Per nursing request, ordered U/A and Cx Also since no BM, ordered Sorbitol and SSE.   ROS:   Pt denies SOB, abd pain, CP, N/V/(+)C/D, and vision changes    Objective:   No results found.  Recent Labs    07/10/22 0714  WBC 7.1  HGB 9.9*  HCT 32.2*  PLT 382   Recent Labs    07/10/22 0714  NA 138  K 4.1  CL 106  CO2 26  GLUCOSE 149*  BUN 17  CREATININE 0.68  CALCIUM 8.7*    Intake/Output Summary (Last 24 hours) at 07/12/2022 1741 Last data filed at 07/12/2022 1312 Gross per 24 hour  Intake 388 ml  Output --  Net 388 ml        Physical Exam: Vital Signs Blood pressure (!) 130/55, pulse 92, temperature 98.3 F (36.8 C), temperature source Oral, resp. rate 16, height 5' (1.524 m), weight 66.1 kg, SpO2 97 %.      General: awake, alert, appropriate, just woke up; drowsy;  NAD HENT: conjugate gaze; oropharynx moist CV: regular rate; no JVD Pulmonary: CTA B/L; no W/R/R- good air movement GI: soft, NT, ND, (+)BS- hypoactive Psychiatric: appropriate- slightly anxious Neurological: Ox3; sleepy- sedated Musculoskeletal:        General: swelling RLE especially proximally > LLE--stable    Cervical back: Normal range of motion.  Skin:    General: Skin is warm.     Comments: Right hip incision  CDI. Surrounding bruising along thigh R>L- outer and inner thigh Neurological:     Mental Status: She is alert and oriented to person, place, and time.     No nystagmus appreciated    Cranial Nerves: CN 2-12 grossly I ntact    Sensory: No sensory deficit.     Coordination: Coordination normal.     Comments: Right hip limited by pain. Otherwise motor 5/5 UE, 3-4+/5 LLE, Right ADF/APF 4/5. No sensory findings. DTR's 1+     Assessment/Plan: 1. Functional  deficits which require 3+ hours per day of interdisciplinary therapy in a comprehensive inpatient rehab setting. Physiatrist is providing close team supervision and 24 hour management of active medical problems listed below. Physiatrist and rehab team continue to assess barriers to discharge/monitor patient progress toward functional and medical goals  Care Tool:  Bathing    Body parts bathed by patient: Right arm, Left arm, Chest, Abdomen, Right upper leg, Left upper leg, Face, Buttocks, Front perineal area   Body parts bathed by helper: Right lower leg, Left lower leg     Bathing assist Assist Level: Minimal Assistance - Patient > 75%     Upper Body Dressing/Undressing Upper body dressing   What is the patient wearing?: Pull over shirt    Upper body assist Assist Level: Set up assist    Lower Body Dressing/Undressing Lower body dressing      What is the patient wearing?: Pants, Incontinence brief     Lower body assist Assist for lower body dressing: Minimal Assistance - Patient > 75%  Toileting Toileting    Toileting assist Assist for toileting: Contact Guard/Touching assist     Transfers Chair/bed transfer  Transfers assist  Chair/bed transfer activity did not occur: Safety/medical concerns  Chair/bed transfer assist level: Minimal Assistance - Patient > 75%     Locomotion Ambulation   Ambulation assist   Ambulation activity did not occur: Safety/medical concerns  Assist level: Minimal Assistance - Patient > 75% Assistive device: Walker-rolling Max distance: 50   Walk 10 feet activity   Assist  Walk 10 feet activity did not occur: Safety/medical concerns  Assist level: Minimal Assistance - Patient > 75% Assistive device: Walker-rolling   Walk 50 feet activity   Assist Walk 50 feet with 2 turns activity did not occur: Safety/medical concerns  Assist level: Minimal Assistance - Patient > 75% Assistive device: Walker-rolling    Walk 150  feet activity   Assist Walk 150 feet activity did not occur: Safety/medical concerns         Walk 10 feet on uneven surface  activity   Assist Walk 10 feet on uneven surfaces activity did not occur: Safety/medical concerns         Wheelchair     Assist Is the patient using a wheelchair?: Yes Type of Wheelchair: Manual Wheelchair activity did not occur: Safety/medical concerns         Wheelchair 50 feet with 2 turns activity    Assist    Wheelchair 50 feet with 2 turns activity did not occur: Safety/medical concerns       Wheelchair 150 feet activity     Assist  Wheelchair 150 feet activity did not occur: Safety/medical concerns       Blood pressure (!) 130/55, pulse 92, temperature 98.3 F (36.8 C), temperature source Oral, resp. rate 16, height 5' (1.524 m), weight 66.1 kg, SpO2 97 %. Medical Problem List and Plan: 1. Functional deficits secondary to a right intertrochanteric hip fx s/p ORIF 07/01/22             -patient may shower starting 8/16 if no drainage from incisions             -ELOS/Goals: 10-14 day, mod I with PT and OT  Con't CIR- PT and OT- team conference today to determine length of stay/f/u on progress 2.  Antithrombotics: -DVT/anticoagulation:  Pharmaceutical: Lovenox - LE dopplers negative--encourage elevation of legs when possible/ TEDS             -antiplatelet therapy: N/a 3. Pain Management: Hydrocodone prn.  4. Mood/Behavior/Sleep: LCSW to follow for evaluation and support.              -8/22- sleeping well- con't regimen              -antipsychotic agents: N/A 5. Neuropsych/cognition: This patient is capable of making decisions on her own behalf. 6. Skin/Wound Care: Routine pressure relief measures.  7. Fluids/Electrolytes/Nutrition: Monitor I/O.  .   -emptying without issues currently  -stopped bladder scans  -encourage voiding out of bed when possible 8. Anxiety d/o: Managed with celexa. Intermittent anxiety  attacks  -team providing ego support on a daily basis  -have requested neuropsych visit  -would like to avoid benzos if possible, consider low dose xanax prn potentially  8/21- pt doing ok so far- con't reigmen 9. ABLA:  .   -HGB down to 8.1 8/15--> 8.8  8/18 10. Leucocytosis: wound clean, chest clear. Likely reactive             -  Improved to 8.4 8/15  8/21- WBC 7.1k- doing well 11.  Constipation: improved -continue senna-s.   -large BM 8/19  8/21- LBM 2 days ago- if no BM by tomorrow, will intervene  8/22- will give Sorbitol after therapy 30cc at 3pm- still hasn't gone  8/23- no BM with sorbitol- ordered more this afternoon no BM still- will give SSE this evening if no large BM 12. Confusion/sedation  8/23- checked U/A and Cx- U/A equivocal for UTI- will wait for U Cx.    I spent a total of  39  minutes on total care today- >50% coordination of care- due to d/w with nursing about possible confusion/sedation- U/A done- has small leuks, no nitrites, many bacteria, but 6-10 WBCs- will monitor Urine Cx   LOS: 9 days A FACE TO FACE EVALUATION WAS PERFORMED  Amber Ruiz 07/12/2022, 5:41 PM

## 2022-07-13 MED ORDER — TRAZODONE HCL 50 MG PO TABS
75.0000 mg | ORAL_TABLET | Freq: Every evening | ORAL | Status: DC | PRN
  Administered 2022-07-13 – 2022-07-17 (×4): 75 mg via ORAL
  Filled 2022-07-13 (×5): qty 2

## 2022-07-13 NOTE — Progress Notes (Signed)
Orthopaedic Trauma Progress Note  Doing well this morning.  Progressing well with therapies.  Notes she had a tough day yesterday with pain in the leg and mobility but is feeling better this morning.  Currently sitting on the edge of the bed, about to work with physical therapy.  No other specific complaints.  Anticipated discharge 07/18/2022.  IMAGING: Stable post op imaging.  Plan for repeat x-rays right femur 07/17/2022   ASSESSMENT: Gary Bultman is a 81 y.o. female s/p INTRAMEDULLARY  NAIL RIGHT INTERTROCHANTERIC FEMUR FRACTURE 07/02/22   PLAN: Weightbearing: WBAT RLE ROM: Okay for unrestricted hip and knee motion as tolerated Incisional and dressing care: OK to leave incisions open to air Showering: Okay to shower and get incisions wet  Orthopedic device(s): None  Pain management: continue current regimen per CIR VTE prophylaxis: Lovenox, SCDs Foley/Lines:  No foley, KVO IVFs Impediments to Fracture Healing: Vitamin D level 19, started on D supplementation. Dispo: Care per CIR. Will plan for repeat imaging right hip 07/17/2022.  From ortho standpoint, patient ok to transition to DOAC and continue this for a total of 30 days post-op. Recommend continuing Vit D supplementation x8 weeks  Follow - up plan: 2 weeks after discharge for wound check and repeat x-rays   Contact information:  Truitt Merle MD, Thyra Breed PA-C. After hours and holidays please check [email or web address] for group call information for [facility]   Thompson Caul, PA-C [phone] (office) [email or web address]

## 2022-07-13 NOTE — Progress Notes (Signed)
Speech Language Pathology Daily Session Note  Patient Details  Name: Amber Ruiz MRN: [id number] Date of Birth: Mar 21, 1941  Today's Date: 07/13/2022 SLP Individual Time: 1405-1501 SLP Individual Time Calculation (min): 56 min  Short Term Goals: Week 2: SLP Short Term Goal 1 (Week 2): STGs=LTGs due to ELOS  Skilled Therapeutic Interventions: Skilled ST treatment focused on cognitive goals. Patient received semi reclined in recliner on arrival. Pt was in good spirits and reported she was having a much better day than yesterday. Patient was tangential and verbose throughout session but easily redirectable and appeared aware of talkative nature. Pt engaged in functional discussion with sup A verbal cues for planning and anticipating needs at discharge. Pt reported having just spoken to her son on the phone regarding discharge plans. Pt effectively verbalized use and used external memory aid as written by PT in session this a.m. to identify when it would be appropriate to request specific medications with sup A verbal cues for effectiveness. Patient was left in recliner with alarm activated and immediate needs within reach at end of session. Continue per current plan of care.       Pain    Therapy/Group: Individual Therapy  [name] 07/13/2022, 4:12 PM

## 2022-07-13 NOTE — Progress Notes (Signed)
Occupational Therapy Session Note  Patient Details  Name: Amber Ruiz MRN: [id number] Date of Birth: [birth year]  Today's Date: 07/13/2022 OT Individual Time: 7289-7915 OT Individual Time Calculation (min): 47 min    Short Term Goals: Week 1:  OT Short Term Goal 1 (Week 1): Pt will perform all UB self care with close S OT Short Term Goal 1 - Progress (Week 1): Met OT Short Term Goal 2 (Week 1): Pt will perform LB bathing and dressing with min a with AE/DME OT Short Term Goal 2 - Progress (Week 1): Met OT Short Term Goal 3 (Week 1): Pt wll transfer to shower and toilet with DME with min A OT Short Term Goal 3 - Progress (Week 1): Met OT Short Term Goal 4 (Week 1): Pt will stand for grooming sink side with CGA up to 5 min OT Short Term Goal 4 - Progress (Week 1): Met  Skilled Therapeutic Interventions/Progress Updates:  Pt greeted seated on toilet with PTA present, pt  agreeable to OT intervention. Session focus on BADL reeducation, functional mobility, dynamic standing balance and decreasing overall caregiver burden.       Pt completed pericare via sit>stand with CGA, pt ambulated to shower with Rw and CGA. Pt did have moment of nausea from vertigo when entering the shower, however with seated rest break and deep breathing the feeling passes.   Incision covered for shower, pt completed bathing from sitting in shower seat with overall CGA, pt exited shower with RW and MINA.  Pt completed dressing from w/c with set- up assist fro UB dressing, and MIN A for LB dressing, needing most assist to thread pants on R side but able to stand and pull pants up to waist line with ony CGA. Pt donned socks with MOD A needing assist to don R sock. Pt completed seated grooming tasks at sink MODI.         Pt left seated in recliner with safety belt activated all needs within reach  Therapy Documentation Precautions:  Precautions Precautions: Fall, Other (comment) Precaution Comments: watch BP and SpO2;  vertigo Restrictions Weight Bearing Restrictions: Yes RLE Weight Bearing: Weight bearing as tolerated  Pain: no pain reported during session     Therapy/Group: Individual Therapy  Corinne Ports [facility] 07/13/2022, 12:05 PM

## 2022-07-13 NOTE — Progress Notes (Signed)
Physical Therapy Session Note  Patient Details  Name: Amber Ruiz MRN: [id number] Date of Birth: [birth year]  Today's Date: 07/13/2022 PT Individual Time: 0805-0900 and 1110 - 1208  PT Individual Time Calculation (min): 55 min and 58 min  Short Term Goals: Week 2:  PT Short Term Goal 1 (Week 2): =LTGs d/t ELOS  Skilled Therapeutic Interventions/Progress Updates: Pt presented in bed agreeable to therapy. Pt sates increased pain and overall general malaise this am. Pt verbalized significant depression that has been building over a significant period of time. PTA provided active listening and encouragement throughout session with PTA also providing small encouragement to find friend/family/church person to talk to. Pt also explained frustrated that she has not been in this position (hospitalized) before. Pt also expressed that she has had more difficulty sleeping over past 2 days and has increased pain and muscle tightness particularly in evening. PTA checked pt med list and advised that pt has muscle relaxant and sedative as needed with PTA writing those specific meds (robaxin and trazadone) and what they can be used for on paper with note indicating these can be taken as needed. Pt thankful for information. Pt agreeable to ambulate to toilet with pt performing supine to sit with HOB elevated and PTA providing gait belt to use as leg lifter with CGA and increased time with verbal cues for sequencing. Pt noted to be incontinent of bladder with pt unaware that was incontinent with pt stating that she had not had problems with incontinence previously. Pt was able to perform Sit to stand and ambulate to toilet with supervision overall. PTA removed brief due to being soiled and pt was able to empty bladder while sitting on toilet. Pt then handed off to COTA for following session with current needs met.   Tx2: Pt presented in recliner agreeable to therapy. Pt states feels physically better than this am, but  did not rate pain. Pt performed Sit to standn and ambulated to rehab gym with supervision overall but with significantly decreased gait speed. Pt hyper verbal throughout session and would stop intermittently to speak with people in hallway. PTA noted that pt would occasionally take hands of RW when speaking with people however no unsteadiness noted. Pt then ascended/descended x 8 3in steps using B rails. Pt was able to perform x 1 step through however demonstrated improved safety performing step to pattern. Pt also performed hip flexion to 3 in step with attempt to step to second step however was limited by pain. Pt transported back to room at end of session and returned to recliner in same manner as prior. Pt left in recliner at end of session with call bell within reach and needs met.      Therapy Documentation Precautions:  Precautions Precautions: Fall, Other (comment) Precaution Comments: watch BP and SpO2; vertigo Restrictions Weight Bearing Restrictions: Yes RLE Weight Bearing: Weight bearing as tolerated General:   Vital Signs: Therapy Vitals Pulse Rate: 76 Resp: 16 BP: 119/81 Patient Position (if appropriate): Sitting Oxygen Therapy SpO2: 100 % O2 Device: Room Air Pain:   Mobility:   Locomotion :    Trunk/Postural Assessment :    Balance:   Exercises:   Other Treatments:      Therapy/Group: Individual Therapy  Amber Ruiz 07/13/2022, 4:02 PM

## 2022-07-13 NOTE — Progress Notes (Addendum)
Speech Language Pathology Discharge Summary  Patient Details  Name: Amber Ruiz MRN: [id number] Date of Birth: 13-May-1941  Date of Discharge from SLP service:July 17, 2022  Today's Date: 07/20/2022 SLP Individual Time: 1020-1100 SLP Individual Time Calculation (min): 40 min  Skilled Therapeutic Interventions: Skilled ST treatment focused on cognitive goals. SLP facilitated session by providing min A verbal cues in regards to mildly complex problem solving tasks and verbal reasoning with home safety scenarios. Pt required sup A verbal redirection cues due to tendency to share many different stories throughout session which distracted pt from topic at hand, most of which related to topic of discussion however. Pt feels prepared to discharge tomorrow with family support and hired help. Patient was left in wheelchair with alarm activated and immediate needs within reach at end of session.   Patient has met 2 of 2 long term goals.  Patient to discharge at overall Supervision level.   Reasons goals not met:   NA  Clinical Impression/Discharge Summary: Patient has made functional gains and has met 2 of 2 long-term goals this admission. Patient is currently completing functional and mildly complex cognitive tasks with supervision level verbal cues to mod I in regards to problem solving. Pt continues to require at least supervision A verbal cues for carry over of compensatory memory strategies and demonstrates overall mild-to-moderate deficits in short-term recall and attention. Pt supports difficulty with memory at baseline in which family confirmed and feel pt is at/near cognitive baseline. Patient education is complete and patient to discharge at overall supervision level. Patient's care partner is independent to provide the necessary physical and cognitive assistance at discharge along with hired help. It is recommended pt have support with iADLs such as medication management. Son manages finances.  Patient would benefit from continued SLP services in home health setting to maximize cognitive function and functional independence. Recommend at at least intermittent supervision at discharge. It is not recommended pt be left alone for multiple hours at a time.   Care Partner:  Caregiver Able to Provide Assistance: Yes  Type of Caregiver Assistance: Physical;Cognitive  Recommendation:  24 hour supervision/assistance;Home Health SLP  Rationale for SLP Follow Up: Maximize cognitive function and independence;Reduce caregiver burden   Equipment: NA   Reasons for discharge: Treatment goals met;Discharged from hospital   Patient/Family Agrees with Progress Made and Goals Achieved: Yes    Zane Pellecchia T Anacaren Kohan 07/13/2022, 5:05 PM

## 2022-07-13 NOTE — Plan of Care (Signed)
  Problem: RH Problem Solving Goal: LTG Patient will demonstrate problem solving for (SLP) Description: LTG:  Patient will demonstrate problem solving for basic/complex daily situations with cues  (SLP) Flowsheets (Taken 07/13/2022 1703) LTG Patient will demonstrate problem solving for: Supervision Note: Goals downgraded due to slower than anticipated progress and suspected baseline deficits that may impact degree of carry over    Problem: RH Memory Goal: LTG Patient will use memory compensatory aids to (SLP) Description: LTG:  Patient will use memory compensatory aids to recall biographical/new, daily complex information with cues (SLP) Flowsheets (Taken 07/13/2022 1703) LTG: Patient will use memory compensatory aids to (SLP): Supervision Note: Goals downgraded due to slower than anticipated progress and suspected baseline deficits that may impact degree of carry over

## 2022-07-13 NOTE — Progress Notes (Signed)
PROGRESS NOTE   Subjective/Complaints:  Pt reports LBM last night.  Trouble sleeping- couldn't get to sleep until after 2am  Did receive a dose last night- Of trazodone of 25 mg. Prn.   Anxious about needing to have staff get her to bathroom, so not drinking well- asked her to drink more to try and avoid UTI.    ROS:  Pt denies SOB, abd pain, CP, N/V/C/D, and vision changes   Objective:   No results found.  No results for input(s): "WBC", "HGB", "HCT", "PLT" in the last 72 hours.  No results for input(s): "NA", "K", "CL", "CO2", "GLUCOSE", "BUN", "CREATININE", "CALCIUM" in the last 72 hours.   Intake/Output Summary (Last 24 hours) at 07/13/2022 1015 Last data filed at 07/12/2022 1823 Gross per 24 hour  Intake 300 ml  Output --  Net 300 ml        Physical Exam: Vital Signs Blood pressure (!) 144/65, pulse 84, temperature 98.8 F (37.1 C), temperature source Oral, resp. rate 16, height 5' (1.524 m), weight 66.1 kg, SpO2 95 %.       General: awake, alert, appropriate, supine in bed; with therapy monkey stuffy around neck; NAD HENT: conjugate gaze; oropharynx moist CV: regular rate; no JVD Pulmonary: CTA B/L; no W/R/R- good air movement GI: soft, NT, ND, (+)BS Psychiatric: appropriate but anxious Neurological: sleepy- but alert Musculoskeletal:        General: swelling RLE especially proximally > LLE--stable    Cervical back: Normal range of motion.  Skin:    General: Skin is warm.     Comments: Right hip incision  CDI. Surrounding bruising along thigh R>L- outer and inner thigh Neurological:     Mental Status: She is alert and oriented to person, place, and time.     No nystagmus appreciated    Cranial Nerves: CN 2-12 grossly I ntact    Sensory: No sensory deficit.     Coordination: Coordination normal.     Comments: Right hip limited by pain. Otherwise motor 5/5 UE, 3-4+/5 LLE, Right ADF/APF 4/5. No  sensory findings. DTR's 1+     Assessment/Plan: 1. Functional deficits which require 3+ hours per day of interdisciplinary therapy in a comprehensive inpatient rehab setting. Physiatrist is providing close team supervision and 24 hour management of active medical problems listed below. Physiatrist and rehab team continue to assess barriers to discharge/monitor patient progress toward functional and medical goals  Care Tool:  Bathing    Body parts bathed by patient: Right arm, Left arm, Chest, Abdomen, Right upper leg, Left upper leg, Face, Buttocks, Front perineal area   Body parts bathed by helper: Right lower leg, Left lower leg     Bathing assist Assist Level: Minimal Assistance - Patient > 75%     Upper Body Dressing/Undressing Upper body dressing   What is the patient wearing?: Pull over shirt    Upper body assist Assist Level: Set up assist    Lower Body Dressing/Undressing Lower body dressing      What is the patient wearing?: Pants, Incontinence brief     Lower body assist Assist for lower body dressing: Minimal Assistance - Patient > 75%  Toileting Toileting    Toileting assist Assist for toileting: Contact Guard/Touching assist     Transfers Chair/bed transfer  Transfers assist  Chair/bed transfer activity did not occur: Safety/medical concerns  Chair/bed transfer assist level: Minimal Assistance - Patient > 75%     Locomotion Ambulation   Ambulation assist   Ambulation activity did not occur: Safety/medical concerns  Assist level: Minimal Assistance - Patient > 75% Assistive device: Walker-rolling Max distance: 50   Walk 10 feet activity   Assist  Walk 10 feet activity did not occur: Safety/medical concerns  Assist level: Minimal Assistance - Patient > 75% Assistive device: Walker-rolling   Walk 50 feet activity   Assist Walk 50 feet with 2 turns activity did not occur: Safety/medical concerns  Assist level: Minimal  Assistance - Patient > 75% Assistive device: Walker-rolling    Walk 150 feet activity   Assist Walk 150 feet activity did not occur: Safety/medical concerns         Walk 10 feet on uneven surface  activity   Assist Walk 10 feet on uneven surfaces activity did not occur: Safety/medical concerns         Wheelchair     Assist Is the patient using a wheelchair?: Yes Type of Wheelchair: Manual Wheelchair activity did not occur: Safety/medical concerns         Wheelchair 50 feet with 2 turns activity    Assist    Wheelchair 50 feet with 2 turns activity did not occur: Safety/medical concerns       Wheelchair 150 feet activity     Assist  Wheelchair 150 feet activity did not occur: Safety/medical concerns       Blood pressure (!) 144/65, pulse 84, temperature 98.8 F (37.1 C), temperature source Oral, resp. rate 16, height 5' (1.524 m), weight 66.1 kg, SpO2 95 %. Medical Problem List and Plan: 1. Functional deficits secondary to a right intertrochanteric hip fx s/p ORIF 07/01/22             -patient may shower starting 8/16 if no drainage from incisions             -ELOS/Goals: 10-14 day, mod I with PT and OT  D/c 8/29  Con't CIR- PT and OT 2.  Antithrombotics: -DVT/anticoagulation:  Pharmaceutical: Lovenox - LE dopplers negative--encourage elevation of legs when possible/ TEDS             -antiplatelet therapy: N/a 3. Pain Management: Hydrocodone prn.  4. Mood/Behavior/Sleep: LCSW to follow for evaluation and support. 8/24- not sleeping- per #13              -antipsychotic agents: N/A 5. Neuropsych/cognition: This patient is capable of making decisions on her own behalf. 6. Skin/Wound Care: Routine pressure relief measures.  7. Fluids/Electrolytes/Nutrition: Monitor I/O.  .   -emptying without issues currently  -stopped bladder scans  -encourage voiding out of bed when possible 8. Anxiety d/o: Managed with celexa. Intermittent anxiety  attacks  -team providing ego support on a daily basis  -have requested neuropsych visit  -would like to avoid benzos if possible, consider low dose xanax prn potentially  8/21- pt doing ok so far- con't reigmen 9. ABLA:  .   -HGB down to 8.1 8/15--> 8.8  8/18  8/24- Hb 9.9- con't to monitor 10. Leucocytosis: wound clean, chest clear. Likely reactive             -Improved to 8.4 8/15  8/21- WBC 7.1k- doing well 11.  Constipation:  improved -continue senna-s.   8/23- no BM with sorbitol- ordered more this afternoon no BM still- will give SSE this evening if no large BM  8/24- LBM last evening large 12. Confusion/sedation  8/23- checked U/A and Cx- U/A equivocal for UTI- will wait for U Cx.   8/24- d/w pt need to drink more to push fluids so can avid a UTI. Not drinking enough 13. Insomnia  8/24- will increase trazodone to 75 mg QHS for sleep and change to scheduled since pt not sleeping well   I spent a total of  35  minutes on total care today- >50% coordination of care- due to d/w staff about drinking and to d/w pt the insomnia.    LOS: 10 days A FACE TO FACE EVALUATION WAS PERFORMED  Amber Ruiz 07/13/2022, 10:15 AM

## 2022-07-14 LAB — URINE CULTURE: Culture: >=100000 — AB

## 2022-07-14 MED ORDER — AMOXICILLIN 250 MG PO CAPS
500.0000 mg | ORAL_CAPSULE | Freq: Three times a day (TID) | ORAL | Status: AC
  Administered 2022-07-14 – 2022-07-19 (×15): 500 mg via ORAL
  Filled 2022-07-14 (×15): qty 2

## 2022-07-14 NOTE — Progress Notes (Signed)
Occupational Therapy Session Note  Patient Details  Name: Amber Ruiz MRN: [id number] Date of Birth: [birth year]  Today's Date: 07/14/2022 OT Individual Time: 0800-0900 OT Individual Time Calculation (min): 60 min    Short Term Goals: Week 2: Week 2= LTG's   Skilled Therapeutic Interventions/Progress Updates:  Pt in bed upon OT arrival. Open to all presented treatment for facilitation of increased indep with AE for full AM self care training including reacher training for LB dressing and standing tolerance for mouth care. Pt transferred supine to sit with S then EOB with S. Amb with RW from EOB to regular chair set in front of sink with increased time and CGA/close S. Pt able to perform UB bathing with set up only, seated grooming and UB dressing with set up. Stood for mouth care 4 minutes with close S. LB bathing with CGA. Training with use of reacher to don pants over feet and rec for home use with note left on white board. Needed CGA only. Amb to recliner and performed sock donning and new tennis shoes donning. Unable to tie. Could try elastic laces next session. Discussed at length strategies for task simplification once home with a cg or at son's depending on dispo. Left pt with chair alarm active, needs and call button in reach.   Therapy Documentation Precautions:  Precautions Precautions: Fall, Other (comment) Precaution Comments: watch BP and SpO2; vertigo Restrictions Weight Bearing Restrictions: Yes RLE Weight Bearing: Weight bearing as tolerated    Therapy/Group: Individual Therapy  Vicenta Dunning 07/14/2022, 8:21 AM

## 2022-07-14 NOTE — Progress Notes (Signed)
UA was ordered due to sedation/confusion and UCS grew out >100,000 enterococcus. She continues to have delay in processing/memory issues. Will start patient on amoxicillin 500 mg tid X 5 days.

## 2022-07-14 NOTE — Progress Notes (Signed)
PROGRESS NOTE   Subjective/Complaints:  Pt reports slept MUCH better last night- slept through night- sore this AM, and feels sleepy still; but loved how well she slept.  ROS:  Pt denies SOB, abd pain, CP, N/V/C/D, and vision changes   Objective:   No results found.  No results for input(s): "WBC", "HGB", "HCT", "PLT" in the last 72 hours.  No results for input(s): "NA", "K", "CL", "CO2", "GLUCOSE", "BUN", "CREATININE", "CALCIUM" in the last 72 hours.   Intake/Output Summary (Last 24 hours) at 07/14/2022 0817 Last data filed at 07/14/2022 0656 Gross per 24 hour  Intake 600 ml  Output 325 ml  Net 275 ml        Physical Exam: Vital Signs Blood pressure 123/63, pulse 70, temperature 98.3 F (36.8 C), temperature source Oral, resp. rate 16, height 5' (1.524 m), weight 66.1 kg, SpO2 94 %.        General: woke from sleep- supine in bed;  NAD HENT: conjugate gaze; oropharynx moist CV: regular rate; no JVD Pulmonary: CTA B/L; no W/R/R- good air movement GI: soft, NT, ND, (+)BS Psychiatric: appropriate; sleepy Neurological: Ox3 More sleepy this AM- hard to keep her awake- saw at 6:30am Musculoskeletal:        General: swelling RLE especially proximally > LLE--stable    Cervical back: Normal range of motion.  Skin:    General: Skin is warm.     Comments: Right hip incision  CDI. Surrounding bruising along thigh R>L- outer and inner thigh Neurological:     Mental Status: She is alert and oriented to person, place, and time.     No nystagmus appreciated    Cranial Nerves: CN 2-12 grossly I ntact    Sensory: No sensory deficit.     Coordination: Coordination normal.     Comments: Right hip limited by pain. Otherwise motor 5/5 UE, 3-4+/5 LLE, Right ADF/APF 4/5. No sensory findings. DTR's 1+     Assessment/Plan: 1. Functional deficits which require 3+ hours per day of interdisciplinary therapy in a comprehensive  inpatient rehab setting. Physiatrist is providing close team supervision and 24 hour management of active medical problems listed below. Physiatrist and rehab team continue to assess barriers to discharge/monitor patient progress toward functional and medical goals  Care Tool:  Bathing    Body parts bathed by patient: Right arm, Left arm, Chest, Abdomen, Right upper leg, Left upper leg, Face, Buttocks, Front perineal area, Right lower leg, Left lower leg   Body parts bathed by helper: Right lower leg, Left lower leg     Bathing assist Assist Level: Contact Guard/Touching assist     Upper Body Dressing/Undressing Upper body dressing   What is the patient wearing?: Pull over shirt    Upper body assist Assist Level: Set up assist    Lower Body Dressing/Undressing Lower body dressing      What is the patient wearing?: Pants, Incontinence brief     Lower body assist Assist for lower body dressing: Minimal Assistance - Patient > 75%     Toileting Toileting    Toileting assist Assist for toileting: Contact Guard/Touching assist     Transfers Chair/bed transfer  Transfers  assist  Chair/bed transfer activity did not occur: Safety/medical concerns  Chair/bed transfer assist level: Minimal Assistance - Patient > 75%     Locomotion Ambulation   Ambulation assist   Ambulation activity did not occur: Safety/medical concerns  Assist level: Minimal Assistance - Patient > 75% Assistive device: Walker-rolling Max distance: 50   Walk 10 feet activity   Assist  Walk 10 feet activity did not occur: Safety/medical concerns  Assist level: Minimal Assistance - Patient > 75% Assistive device: Walker-rolling   Walk 50 feet activity   Assist Walk 50 feet with 2 turns activity did not occur: Safety/medical concerns  Assist level: Minimal Assistance - Patient > 75% Assistive device: Walker-rolling    Walk 150 feet activity   Assist Walk 150 feet activity did not  occur: Safety/medical concerns         Walk 10 feet on uneven surface  activity   Assist Walk 10 feet on uneven surfaces activity did not occur: Safety/medical concerns         Wheelchair     Assist Is the patient using a wheelchair?: Yes Type of Wheelchair: Manual Wheelchair activity did not occur: Safety/medical concerns         Wheelchair 50 feet with 2 turns activity    Assist    Wheelchair 50 feet with 2 turns activity did not occur: Safety/medical concerns       Wheelchair 150 feet activity     Assist  Wheelchair 150 feet activity did not occur: Safety/medical concerns       Blood pressure 123/63, pulse 70, temperature 98.3 F (36.8 C), temperature source Oral, resp. rate 16, height 5' (1.524 m), weight 66.1 kg, SpO2 94 %. Medical Problem List and Plan: 1. Functional deficits secondary to a right intertrochanteric hip fx s/p ORIF 07/01/22             -patient may shower starting 8/16 if no drainage from incisions             -ELOS/Goals: 10-14 day, mod I with PT and OT  D/c 8/29  Con't CIR- PT and OT 2.  Antithrombotics: -DVT/anticoagulation:  Pharmaceutical: Lovenox - LE dopplers negative--encourage elevation of legs when possible/ TEDS             -antiplatelet therapy: N/a 3. Pain Management: Hydrocodone prn.  4. Mood/Behavior/Sleep: LCSW to follow for evaluation and support. 8/24- not sleeping- per #13   8/25- slept better- per #13             -antipsychotic agents: N/A 5. Neuropsych/cognition: This patient is capable of making decisions on her own behalf. 6. Skin/Wound Care: Routine pressure relief measures.  7. Fluids/Electrolytes/Nutrition: Monitor I/O.  .   -emptying without issues currently  -stopped bladder scans  -encourage voiding out of bed when possible 8. Anxiety d/o: Managed with celexa. Intermittent anxiety attacks  -team providing ego support on a daily basis  -have requested neuropsych visit  -would like to avoid  benzos if possible, consider low dose xanax prn potentially  8/25- doing ok without benzo's- will con't OFF benzo's 9. ABLA:  .   -HGB down to 8.1 8/15--> 8.8  8/18  8/24- Hb 9.9- con't to monitor 10. Leucocytosis: wound clean, chest clear. Likely reactive             -Improved to 8.4 8/15  8/21- WBC 7.1k- doing well 11.  Constipation: improved -continue senna-s.   8/23- no BM with sorbitol- ordered more this afternoon no BM  still- will give SSE this evening if no large BM  8/24- LBM last evening large 12. Confusion/sedation  8/23- checked U/A and Cx- U/A equivocal for UTI- will wait for U Cx.   8/24- d/w pt need to drink more to push fluids so can avid a UTI. Not drinking enough 13. Insomnia  8/24- will increase trazodone to 75 mg QHS for sleep and change to scheduled since pt not sleeping well  8/25- slept much better, however still sleepy at 6:30am- explained many people need 8 hours of sleep- she was fine with this- will go back to sleep for now    LOS: 11 days A FACE TO FACE EVALUATION WAS PERFORMED  Whittley Carandang 07/14/2022, 8:17 AM

## 2022-07-14 NOTE — Progress Notes (Signed)
Physical Therapy Session Note  Patient Details  Name: Amber Ruiz MRN: [id number] Date of Birth: [birth year]  Today's Date: 07/14/2022 PT Individual Time: 0935-1030 and 1118-1200 PT Individual Time Calculation (min): 55 min and 42 min  Short Term Goals: Week 2:  PT Short Term Goal 1 (Week 2): =LTGs d/t ELOS  Skilled Therapeutic Interventions/Progress Updates: Tx1: Pt presented in recliner agreeable to therapy. Pt states feels better today mentally and was able to get more sleep. Pt c/o mild discomfort in R hip but did not rate. Pt ambulated to rehab gym with supervision and increased time due to significantly slow cadence. After seated rest pt participated in ascending/descending x 12 steps with B rails with supervision and verbal cues for correct sequencing. Pt was able to correctly recall sequencing ~80% of time but was able to self correct errors before acting on it. Pt then transported to parallel bars and participated in therex including LAQ 2 x 10, standing hip abd/add 2 x 10 bilaterally, mini-squats 2 x 10 and heel raises 2 x 10. Pt then transported to hallway and participated in side stepping ~42ft L/R. Pt required min tactile cues to maintain hips forward. Pt transported back to room and requested to use bathroom. Pt ambulated to toilet with supervision and was able to perform clothing management with supervision. Pt with continent urinary void but did c/o painful urination did not feel that bladder was empty. Pt left at toilet with call bell within reach and nsg notified of pt's disposition as well as that pt's c/o painful urination.   Tx2: Pt presented in recliner agreeable to therapy. Pt pleased with earlier therapies and agreeable to participate in ambulation in [facility]/gift shop. Performed ambulatory transfer to w/c and pt transported pt to gift shop for time management and energy conservation. Pt ambulated throughout gift shop with supervision and demonstrating good safety awareness. Pt  was able to reach for objects off shelf and clothing racks without LOB nor demonstrating instability. Pt ambulated throughout store approx 20 min and then ambulated in [facility] around tables/chairs. Pt was able to sit at standard chair with PTA providing education on community integration while at seated rest. Pt explains does not go "shopping" but does go out to eat with family. Pt stating feeling more confident about mobility. Pt ambulated back to w/c and transported back to room in same manner as prior. Pt returned to recliner via ambulatory transfer. Pt left in recliner at end of session with belt alarm on, call bell within reach and needs met.      Therapy Documentation Precautions:  Precautions Precautions: Fall, Other (comment) Precaution Comments: watch BP and SpO2; vertigo Restrictions Weight Bearing Restrictions: Yes RLE Weight Bearing: Weight bearing as tolerated General:   Vital Signs: Therapy Vitals Temp: 98 F (36.7 C) Temp Source: Oral Pulse Rate: 73 Resp: 16 BP: 123/81 Patient Position (if appropriate): Sitting Oxygen Therapy SpO2: 99 % O2 Device: Room Air Pain:   Mobility:   Locomotion :    Trunk/Postural Assessment :    Balance:   Exercises:   Other Treatments:      Therapy/Group: Individual Therapy  Amber Ruiz 07/14/2022, 3:35 PM

## 2022-07-14 NOTE — Progress Notes (Signed)
Patient ID: Amber Ruiz, female   DOB: 09-21-1941, 81 y.o.   MRN: [id number]  SW received message from pt son Amber Ruiz reporting that the d/c plan is for her to d/c to home with her friend Inez Catalina who they will pay to stay with her overnight and during the day for a few nights per week. Encouraged follow-up if needed.   SW met with pt in room to discuss above. She confirms this is the plan. She does not have a [facility] preference. SW sent HHPT/OT/SLP/aide to [facility] and waiting on follow-up.   Loralee Pacas, MSW, [place] Office: [phone] Cell: [phone] Fax: [phone]

## 2022-07-14 NOTE — Progress Notes (Signed)
Physical Therapy Session Note  Patient Details  Name: Amber Ruiz MRN: [id number] Date of Birth: June 20, 1941  Today's Date: 07/14/2022 PT Individual Time: 4098-1191 PT Individual Time Calculation (min): 29 min  and Today's Date: 07/14/2022 PT Missed Time: 15 Minutes Missed Time Reason: Other (Comment) (previous pt care)  Short Term Goals: Week 2:  PT Short Term Goal 1 (Week 2): =LTGs d/t ELOS  Skilled Therapeutic Interventions/Progress Updates:    Therapist arrived late to session d/t previous pt care, pt missed x 15 min scheduled therapy. Pt received in recliner and agreeable to therapy.  Pt reports some unrated pain with mobility, premedicated. Rest and positioning provided as needed. Pt requesting to use bathroom before leaving room. ambulatory transfer  to bathroom with close supervision and RW, supervision for 3/3 toileting tasks. Continent bladder void noted in flow sheet. Pt ambulated to sink with supervision, requiring cues to locate soap before washing hands.  Pt then ambulated x 120 ft with RW and supervision. Demoes antalgic gait pattern but improving hip and knee flexion. Pt then utilized nustep x 6 min for ROM improvements and global strength. Pt returned to room after session and to recliner in same manner as above, was left with all needs in reach and alarm active.   Therapy Documentation Precautions:  Precautions Precautions: Fall, Other (comment) Precaution Comments: watch BP and SpO2; vertigo Restrictions Weight Bearing Restrictions: Yes RLE Weight Bearing: Weight bearing as tolerated General: PT Amount of Missed Time (min): 15 Minutes PT Missed Treatment Reason: Other (Comment) (previous pt care)     Therapy/Group: Individual Therapy  [name] 07/14/2022, 4:00 PM

## 2022-07-15 DIAGNOSIS — G4700 Insomnia, unspecified: Secondary | ICD-10-CM

## 2022-07-15 DIAGNOSIS — N390 Urinary tract infection, site not specified: Secondary | ICD-10-CM

## 2022-07-15 MED ORDER — POLYETHYLENE GLYCOL 3350 17 G PO PACK
17.0000 g | PACK | Freq: Every day | ORAL | Status: DC
  Administered 2022-07-16 – 2022-07-20 (×5): 17 g via ORAL
  Filled 2022-07-15 (×5): qty 1

## 2022-07-15 NOTE — Progress Notes (Signed)
Physical Therapy Session Note  Patient Details  Name: Amber Ruiz MRN: [id number] Date of Birth: 07-08-1941  Today's Date: 07/15/2022 PT Individual Time: 1300-1415 PT Individual Time Calculation (min): 75 min   Short Term Goals: Week 1:  PT Short Term Goal 1 (Week 1): Pt will ambulate >25 ft with LRAD PT Short Term Goal 1 - Progress (Week 1): Met PT Short Term Goal 2 (Week 1): Pt will initiate stair training PT Short Term Goal 2 - Progress (Week 1): Met PT Short Term Goal 3 (Week 1): Pt will perform bed mobility with mod A or better PT Short Term Goal 3 - Progress (Week 1): Met Week 2:  PT Short Term Goal 1 (Week 2): =LTGs d/t ELOS  Skilled Therapeutic Interventions/Progress Updates:  Pt received in recliner.  She denied pain.  Pt denied having vertigo or nausea today.  Pt very verbose, and excited about feeling better.   VOR exs in sitting, with max cues for full cervical rotations, and slower speed.  No c/o of vertigo or nausea, x 2 minutes. Therapeutic exercises performed with LEs to increase strength for functional mobility: 30 x 1 alternating ankle pumps, 10 x 1 L straight leg raises, R active assistive straight leg raises.  Standing with bil UE support, 12 x 1 mini squats, calf raises. No increase in pain.   Pt stated that she needed to use toilet.  Sit> stand to RW, CGA, slowly. Gait training in room with RW to toilet, CGA.  Pt continent of bladder.  She stated that she had a little feeling of burning; PT informed Nsg.  Hand washing at sink in standing, CGA. with visual cue for finding soap.    Gait training with RW, level tile x 200' including 2 turns, CGA.  Pt had varying velocity, depending upon her internal distraction of conversation.   Pt able to lock/unlock wc brakes with cues.  Bil leg rests adjusted for fit and function.  At end of session, pt seated in  wc with seat belt alarm set and needs at hand.      Therapy Documentation Precautions:  Precautions Precautions:  Fall, Other (comment) Precaution Comments: watch BP and SpO2; vertigo Restrictions Weight Bearing Restrictions: Yes RLE Weight Bearing: Weight bearing as tolerated        Therapy/Group: Individual Therapy  Nour Scalise 07/15/2022, 5:07 PM

## 2022-07-15 NOTE — Progress Notes (Signed)
Occupational Therapy Session Note  Patient Details  Name: Malayah Demuro MRN: [id number] Date of Birth: 1941-03-07  Today's Date: 07/15/2022 OT Individual Time: 4580-9983 OT Individual Time Calculation (min): 85 min    Short Term Goals: Week 1:  OT Short Term Goal 1 (Week 1): Pt will perform all UB self care with close S OT Short Term Goal 1 - Progress (Week 1): Met OT Short Term Goal 2 (Week 1): Pt will perform LB bathing and dressing with min a with AE/DME OT Short Term Goal 2 - Progress (Week 1): Met OT Short Term Goal 3 (Week 1): Pt wll transfer to shower and toilet with DME with min A OT Short Term Goal 3 - Progress (Week 1): Met OT Short Term Goal 4 (Week 1): Pt will stand for grooming sink side with CGA up to 5 min OT Short Term Goal 4 - Progress (Week 1): Met Week 2:     Skilled Therapeutic Interventions/Progress Updates:    The pt was in bed upon arrival indicating that she rested well, she went on to say she was not in pain, however, nursing arrived at the onset of treatment  and administered morning meds.  I arrived early so the  pt ate breakfast  with s/u assist, she required MinA for transferring from supine to E0B secondary to numbness associated with the RLE. The pt was able to transition from sit to stand using the RW and CGA for ambulating to the restroom.  The 3 in 1 was in place for greater ease and the pt was instructed to extend her RLE out when lowering onto the commode, she was able to complete toileting with close S. The pt was able to return to standing using the grab bars and RW for static, dynamic standing balance.  The pt returned to her living quarters and was positioned at the sink for a BADL related task in bathing and dressing.  The pt requires additional time and redirecting secondary to being very social.  The pt was able to wash her face with s/u assit,and her UB with s/u assist.  The pt presents with some challenges in relation to memory for day of the week  qne often perseverates during task performance requiring redirecting. The pt was able to bathe her LB, inclusive of private area and ULE with CGA, she was MaxA for her BLE and feet.  The pt was educated in [place] for long hand sponge to improve her compliance during BLE task performance.   The pt was able to dress her UB with s/u and ModA for LB.  The pt was Dependent with ted hose and shoe.  The pt was able to brush her teeth and comb her hair with s/u assist, she was able to ambulate to the recliner using the RW for additional balance with her bedside table and call light in place.  At the end of treatment chair alarm was activated with all additional needs addressed.   Therapy Documentation Precautions:  Precautions Precautions: Fall, Other (comment) Precaution Comments: watch BP and SpO2; vertigo Restrictions Weight Bearing Restrictions: Yes RLE Weight Bearing: Weight bearing as tolerated  Therapy/Group: Individual Therapy  Yvonne Kendall 07/15/2022, 3:36 PM

## 2022-07-15 NOTE — Progress Notes (Signed)
PROGRESS NOTE   Subjective/Complaints:  Reports she had a "marvelous day". No new complaints.   ROS:  Pt denies SOB, abd pain, CP, N/V/C/D, cough, and vision changes   Objective:   No results found.  No results for input(s): "WBC", "HGB", "HCT", "PLT" in the last 72 hours.  No results for input(s): "NA", "K", "CL", "CO2", "GLUCOSE", "BUN", "CREATININE", "CALCIUM" in the last 72 hours.   Intake/Output Summary (Last 24 hours) at 07/15/2022 2103 Last data filed at 07/15/2022 1820 Gross per 24 hour  Intake 600 ml  Output 550 ml  Net 50 ml         Physical Exam: Vital Signs Blood pressure 120/63, pulse 81, temperature 98.7 F (37.1 C), temperature source Oral, resp. rate 16, height 5' (1.524 m), weight 66.1 kg, SpO2 98 %.        General: alert, in chair,  NAD HENT: conjugate gaze; oropharynx moist CV: regular rate; no JVD Pulmonary: CTA B/L; no W/R/R- good air movement GI: soft, NT, ND, (+)BS Psychiatric: appropriate; sleepy Neurological: Ox3 Alert and awake Musculoskeletal:        General: swelling RLE especially proximally > LLE--stable    Cervical back: Normal range of motion.  Skin:    General: Skin is warm and dry    Comments: Right hip incision  CDI. Surrounding bruising along thigh R>L- outer and inner thigh Neurological:     Mental Status: She is alert and oriented to person, place, and time.     No nystagmus appreciated    Cranial Nerves: CN 2-12 grossly I ntact    Sensory: No sensory deficit.     Coordination: Coordination normal.     Comments: Right hip limited by pain. Otherwise motor 5/5 UE, 3-4+/5 LLE, Right ADF/APF 4/5. No sensory findings. DTR's 1+     Assessment/Plan: 1. Functional deficits which require 3+ hours per day of interdisciplinary therapy in a comprehensive inpatient rehab setting. Physiatrist is providing close team supervision and 24 hour management of active medical  problems listed below. Physiatrist and rehab team continue to assess barriers to discharge/monitor patient progress toward functional and medical goals  Care Tool:  Bathing    Body parts bathed by patient: Right arm, Left arm, Chest, Abdomen, Right upper leg, Left upper leg, Face, Buttocks, Front perineal area, Right lower leg, Left lower leg   Body parts bathed by helper: Right lower leg, Left lower leg     Bathing assist Assist Level: Contact Guard/Touching assist     Upper Body Dressing/Undressing Upper body dressing   What is the patient wearing?: Pull over shirt    Upper body assist Assist Level: Set up assist    Lower Body Dressing/Undressing Lower body dressing      What is the patient wearing?: Pants, Incontinence brief     Lower body assist Assist for lower body dressing: Contact Guard/Touching assist     Toileting Toileting    Toileting assist Assist for toileting: Contact Guard/Touching assist     Transfers Chair/bed transfer  Transfers assist  Chair/bed transfer activity did not occur: Safety/medical concerns  Chair/bed transfer assist level: Minimal Assistance - Patient > 75%  Locomotion Ambulation   Ambulation assist   Ambulation activity did not occur: Safety/medical concerns  Assist level: Contact Guard/Touching assist Assistive device: Walker-rolling Max distance: 200   Walk 10 feet activity   Assist  Walk 10 feet activity did not occur: Safety/medical concerns  Assist level: Contact Guard/Touching assist Assistive device: Walker-rolling   Walk 50 feet activity   Assist Walk 50 feet with 2 turns activity did not occur: Safety/medical concerns  Assist level: Contact Guard/Touching assist Assistive device: Walker-rolling    Walk 150 feet activity   Assist Walk 150 feet activity did not occur: Safety/medical concerns  Assist level: Contact Guard/Touching assist Assistive device: Walker-rolling    Walk 10 feet on  uneven surface  activity   Assist Walk 10 feet on uneven surfaces activity did not occur: Safety/medical concerns         Wheelchair     Assist Is the patient using a wheelchair?: Yes Type of Wheelchair: Manual Wheelchair activity did not occur: Safety/medical concerns         Wheelchair 50 feet with 2 turns activity    Assist    Wheelchair 50 feet with 2 turns activity did not occur: Safety/medical concerns       Wheelchair 150 feet activity     Assist  Wheelchair 150 feet activity did not occur: Safety/medical concerns       Blood pressure 120/63, pulse 81, temperature 98.7 F (37.1 C), temperature source Oral, resp. rate 16, height 5' (1.524 m), weight 66.1 kg, SpO2 98 %. Medical Problem List and Plan: 1. Functional deficits secondary to a right intertrochanteric hip fx s/p ORIF 07/01/22             -patient may shower starting 8/16 if no drainage from incisions             -ELOS/Goals: 10-14 day, mod I with PT and OT  D/c 8/29  Con't CIR- PT and OT 2.  Antithrombotics: -DVT/anticoagulation:  Pharmaceutical: Lovenox - LE dopplers negative--encourage elevation of legs when possible/ TEDS             -antiplatelet therapy: N/a 3. Pain Management: Hydrocodone prn.  4. Mood/Behavior/Sleep: LCSW to follow for evaluation and support. 8/24- not sleeping- per #13   8/25- slept better- per #13             -antipsychotic agents: N/A 5. Neuropsych/cognition: This patient is capable of making decisions on her own behalf. 6. Skin/Wound Care: Routine pressure relief measures.  7. Fluids/Electrolytes/Nutrition: Monitor I/O.  .   -emptying without issues currently  -stopped bladder scans  -encourage voiding out of bed when possible 8. Anxiety d/o: Managed with celexa. Intermittent anxiety attacks  -team providing ego support on a daily basis  -have requested neuropsych visit  -would like to avoid benzos if possible, consider low dose xanax prn  potentially  8/25- doing ok without benzo's- will con't OFF benzo's 9. ABLA:  .   -HGB down to 8.1 8/15--> 8.8  8/18  8/24- Hb 9.9- con't to monitor 10. Leucocytosis: wound clean, chest clear. Likely reactive             -Improved to 8.4 8/15  8/21- WBC 7.1k- doing well 11.  Constipation: improved -continue senna-s.   8/23- no BM with sorbitol- ordered more this afternoon no BM still- will give SSE this evening if no large BM  8/24- LBM last evening large  8/26- Schedule miralax for now 12. Confusion/sedation  8/23- checked U/A and Cx-  U/A equivocal for UTI- will wait for U Cx.   8/24- d/w pt need to drink more to push fluids so can avid a UTI. Not drinking enough  8/26 Amoxicillin for 5 days started for [facility] UTI yesterday 13. Insomnia  8/24- will increase trazodone to 75 mg QHS for sleep and change to scheduled since pt not sleeping well  8/25- slept much better, however still sleepy at 6:30am- explained many people need 8 hours of sleep- she was fine with this- will go back to sleep for now  -Reports sleeping better   LOS: 12 days A FACE TO FACE EVALUATION WAS PERFORMED  Fanny Dance 07/15/2022, 9:03 PM

## 2022-07-16 DIAGNOSIS — D649 Anemia, unspecified: Secondary | ICD-10-CM

## 2022-07-16 MED ORDER — SORBITOL 70 % SOLN
45.0000 mL | Freq: Once | Status: AC
  Administered 2022-07-16: 45 mL via ORAL
  Filled 2022-07-16: qty 60

## 2022-07-16 NOTE — Progress Notes (Signed)
Physical Therapy Session Note  Patient Details  Name: Amber Ruiz MRN: [id number] Date of Birth: Mar 02, 1941  Today's Date: 07/16/2022 PT Individual Time: 1435-1515 PT Individual Time Calculation (min): 40 min   Short Term Goals: Week 1:  PT Short Term Goal 1 (Week 1): Pt will ambulate >25 ft with LRAD PT Short Term Goal 1 - Progress (Week 1): Met PT Short Term Goal 2 (Week 1): Pt will initiate stair training PT Short Term Goal 2 - Progress (Week 1): Met PT Short Term Goal 3 (Week 1): Pt will perform bed mobility with mod A or better PT Short Term Goal 3 - Progress (Week 1): Met Week 2:  PT Short Term Goal 1 (Week 2): =LTGs d/t ELOS  Skilled Therapeutic Interventions/Progress Updates:  Pt received sitting in recliner.  She denied pain at rest.    Therapeutic exercises performed with LEs to increase strength for functional mobility: in long sitting in recliner: 12 x 1 active assistive R heel slide, 15 x 1 L straight leg raises, 30 x 1 bil ankle pumps.  In sitting: 15 x 1 bil ankle eversion.  Pt reported that she needed to use toilet .  Toilet transfer with CGA, and cues for safe hand placement.  Pt continent of urine; information passed to NT. Pt managed clothes with close supervision.   Gait training with RW on level tile x 220' with multiple turns , close supervision/CGA.  Pt demonstrates heavy reliance on bil UEs.  Pt demonstrates mild R ankle inversion during swing phase, but clears foot q step.   At end of session, pt resting in recliner, feet on floor, with needs at hand and seat belt alarm set.  Pt able to demonstrate how to use lever to raise and lower legrests herself, safely with LEs held off of legrest when lowering.  .     Therapy Documentation Precautions:  Precautions Precautions: Fall, Other (comment) Precaution Comments: watch BP and SpO2; vertigo Restrictions Weight Bearing Restrictions: Yes RLE Weight Bearing: Weight bearing as tolerated       Therapy/Group: Individual Therapy  Valynn Schamberger 07/16/2022, 5:03 PM

## 2022-07-16 NOTE — Progress Notes (Signed)
PROGRESS NOTE   Subjective/Complaints:   She has not had BM in a few days. Reports she is doing very well otherwise.  ROS:  Pt denies SOB, abd pain, CP, N/V/D, cough, and vision changes + constipation  Objective:   No results found.  No results for input(s): "WBC", "HGB", "HCT", "PLT" in the last 72 hours.  No results for input(s): "NA", "K", "CL", "CO2", "GLUCOSE", "BUN", "CREATININE", "CALCIUM" in the last 72 hours.   Intake/Output Summary (Last 24 hours) at 07/16/2022 1637 Last data filed at 07/16/2022 0806 Gross per 24 hour  Intake 600 ml  Output --  Net 600 ml         Physical Exam: Vital Signs Blood pressure (!) 120/59, pulse 88, temperature 98.6 F (37 C), resp. rate 15, height 5' (1.524 m), weight 66.1 kg, SpO2 98 %.        General: alert, in chair,  NAD HENT: conjugate gaze; oropharynx moist CV: regular rate; no JVD Pulmonary: CTA B/L; no W/R/R- good air movement GI: soft, NT, ND, (+)BS Psychiatric: appropriate; alert, very pleasant Neurological: Ox3 Alert and awake Musculoskeletal:        General: swelling RLE especially proximally > LLE--stable    Cervical back: Normal range of motion.  Skin:    General: Skin is warm and dry    Comments: Right hip incision  CDI. Surrounding bruising along thigh R>L- outer and inner thigh Neurological:     Mental Status: She is alert and oriented to person, place, and time.     No nystagmus appreciated    Cranial Nerves: CN 2-12 grossly I ntact    Sensory: No sensory deficit.     Coordination: Coordination normal.     Comments: Right hip limited by pain. Otherwise motor 5/5 UE, 3-4+/5 LLE, Right ADF/APF 4/5. No sensory findings. DTR's 1+     Assessment/Plan: 1. Functional deficits which require 3+ hours per day of interdisciplinary therapy in a comprehensive inpatient rehab setting. Physiatrist is providing close team supervision and 24 hour  management of active medical problems listed below. Physiatrist and rehab team continue to assess barriers to discharge/monitor patient progress toward functional and medical goals  Care Tool:  Bathing    Body parts bathed by patient: Right arm, Left arm, Chest, Abdomen, Right upper leg, Left upper leg, Face, Buttocks, Front perineal area, Right lower leg, Left lower leg   Body parts bathed by helper: Right lower leg, Left lower leg     Bathing assist Assist Level: Contact Guard/Touching assist     Upper Body Dressing/Undressing Upper body dressing   What is the patient wearing?: Pull over shirt    Upper body assist Assist Level: Set up assist    Lower Body Dressing/Undressing Lower body dressing      What is the patient wearing?: Pants, Incontinence brief     Lower body assist Assist for lower body dressing: Contact Guard/Touching assist     Toileting Toileting    Toileting assist Assist for toileting: Contact Guard/Touching assist     Transfers Chair/bed transfer  Transfers assist  Chair/bed transfer activity did not occur: Safety/medical concerns  Chair/bed transfer assist level: Minimal Assistance -  Patient > 75%     Locomotion Ambulation   Ambulation assist   Ambulation activity did not occur: Safety/medical concerns  Assist level: Contact Guard/Touching assist Assistive device: Walker-rolling Max distance: 200   Walk 10 feet activity   Assist  Walk 10 feet activity did not occur: Safety/medical concerns  Assist level: Contact Guard/Touching assist Assistive device: Walker-rolling   Walk 50 feet activity   Assist Walk 50 feet with 2 turns activity did not occur: Safety/medical concerns  Assist level: Contact Guard/Touching assist Assistive device: Walker-rolling    Walk 150 feet activity   Assist Walk 150 feet activity did not occur: Safety/medical concerns  Assist level: Contact Guard/Touching assist Assistive device:  Walker-rolling    Walk 10 feet on uneven surface  activity   Assist Walk 10 feet on uneven surfaces activity did not occur: Safety/medical concerns         Wheelchair     Assist Is the patient using a wheelchair?: Yes Type of Wheelchair: Manual Wheelchair activity did not occur: Safety/medical concerns         Wheelchair 50 feet with 2 turns activity    Assist    Wheelchair 50 feet with 2 turns activity did not occur: Safety/medical concerns       Wheelchair 150 feet activity     Assist  Wheelchair 150 feet activity did not occur: Safety/medical concerns       Blood pressure (!) 120/59, pulse 88, temperature 98.6 F (37 C), resp. rate 15, height 5' (1.524 m), weight 66.1 kg, SpO2 98 %. Medical Problem List and Plan: 1. Functional deficits secondary to a right intertrochanteric hip fx s/p ORIF 07/01/22             -patient may shower starting 8/16 if no drainage from incisions             -ELOS/Goals: 10-14 day, mod I with PT and OT  -D/c 8/29  -Con't CIR- PT and OT 2.  Antithrombotics: -DVT/anticoagulation:  Pharmaceutical: Lovenox - LE dopplers negative--encourage elevation of legs when possible/ TEDS             -antiplatelet therapy: N/a 3. Pain Management: Hydrocodone prn.  4. Mood/Behavior/Sleep: LCSW to follow for evaluation and support. 8/24- not sleeping- per #13   8/25- slept better- per #13             -antipsychotic agents: N/A 5. Neuropsych/cognition: This patient is capable of making decisions on her own behalf. 6. Skin/Wound Care: Routine pressure relief measures.  7. Fluids/Electrolytes/Nutrition: Monitor I/O.  .   -emptying without issues currently  -stopped bladder scans  -encourage voiding out of bed when possible 8. Anxiety d/o: Managed with celexa. Intermittent anxiety attacks  -team providing ego support on a daily basis  -have requested neuropsych visit  -would like to avoid benzos if possible, consider low dose xanax prn  potentially  8/25- doing ok without benzo's- will con't OFF benzo's 9. ABLA:  .   -HGB down to 8.1 8/15--> 8.8  8/18  8/24- Hb 9.9- con't to monitor  -Check with CBC tomorrow 10. Leucocytosis: wound clean, chest clear. Likely reactive             -Improved to 8.4 8/15  8/21- WBC 7.1k- doing well  -CBC tomorrow 11.  Constipation: improved -continue senna-s.   8/23- no BM with sorbitol- ordered more this afternoon no BM still- will give SSE this evening if no large BM  8/24- LBM last evening large  8/26- Scheduled miralax  8/27 Sorbitol ordered today   12. Confusion/sedation  8/23- checked U/A and Cx- U/A equivocal for UTI- will wait for U Cx.   8/24- d/w pt need to drink more to push fluids so can avid a UTI. Not drinking enough  8/26 Amoxicillin for 5 days started for [facility] UTI started yesterday 13. Insomnia  8/24- will increase trazodone to 75 mg QHS for sleep and change to scheduled since pt not sleeping well  8/25- slept much better, however still sleepy at 6:30am- explained many people need 8 hours of sleep- she was fine with this- will go back to sleep for now  -Reports sleeping better   LOS: 13 days A FACE TO FACE EVALUATION WAS PERFORMED  Fanny Dance 07/16/2022, 4:37 PM

## 2022-07-16 NOTE — Progress Notes (Signed)
Physical Therapy Session Note  Patient Details  Name: Amber Ruiz MRN: [id number] Date of Birth: 1941-02-03  Today's Date: 07/16/2022 PT Individual Time: 0915-1000 PT Individual Time Calculation (min): 45 min   Short Term Goals: Week 2:  PT Short Term Goal 1 (Week 2): =LTGs d/t ELOS  Skilled Therapeutic Interventions/Progress Updates:    pt received in bed and agreeable to therapy. Pt reports pain in ankles, discussed discomfort from swelling and inflammation, then donned ted hose max A with pt able to pull up once up to knees. Supine>sit with supervision and bed features. Sit to stand with supervision throughout session to RW. Gait in room distances with supervision throughout session with RW and antalgic gait pattern. Pt requests to go to bathroom and does so with supervision for 3/3 toileting tasks. Pt noted to have soiled brief with urine, therapist assisted with donning fresh brief and changing pants for time. Pt then ambulated to sink and performed morning grooming tasks at sink in standing. Cueing at times for sequencing and recalling tasks. Pt also doffed/donned clean shirt in standing with supervision for balance, no LOB noted. Pt ambulates to recliner and reports minor bout of nausea that improved with rest. Pt was left with all needs in reach and alarm active.   Therapy Documentation Precautions:  Precautions Precautions: Fall, Other (comment) Precaution Comments: watch BP and SpO2; vertigo Restrictions Weight Bearing Restrictions: Yes RLE Weight Bearing: Weight bearing as tolerated General:       Therapy/Group: Individual Therapy  [name] 07/16/2022, 9:45 AM

## 2022-07-17 ENCOUNTER — Inpatient Hospital Stay (HOSPITAL_COMMUNITY): Payer: Medicare Other

## 2022-07-17 LAB — CBC
HCT: 36.8 % (ref 36.0–46.0)
Hemoglobin: 11.4 g/dL — ABNORMAL LOW (ref 12.0–15.0)
MCH: 27.7 pg (ref 26.0–34.0)
MCHC: 31 g/dL (ref 30.0–36.0)
MCV: 89.5 fL (ref 80.0–100.0)
Platelets: 446 10*3/uL — ABNORMAL HIGH (ref 150–400)
RBC: 4.11 MIL/uL (ref 3.87–5.11)
RDW: 17.8 % — ABNORMAL HIGH (ref 11.5–15.5)
WBC: 4.3 10*3/uL (ref 4.0–10.5)
nRBC: 0 % (ref 0.0–0.2)

## 2022-07-17 LAB — BASIC METABOLIC PANEL
Anion gap: 8 (ref 5–15)
BUN: 10 mg/dL (ref 8–23)
CO2: 27 mmol/L (ref 22–32)
Calcium: 9.1 mg/dL (ref 8.9–10.3)
Chloride: 107 mmol/L (ref 98–111)
Creatinine, Ser: 0.68 mg/dL (ref 0.44–1.00)
GFR, Estimated: >60 mL/min (ref >60)
Glucose, Bld: 112 mg/dL — ABNORMAL HIGH (ref 70–99)
Potassium: 3.6 mmol/L (ref 3.5–5.1)
Sodium: 142 mmol/L (ref 135–145)

## 2022-07-17 MED ORDER — SORBITOL 70 % SOLN
30.0000 mL | Freq: Once | Status: AC
  Administered 2022-07-17: 30 mL via ORAL
  Filled 2022-07-17: qty 30

## 2022-07-17 NOTE — Progress Notes (Signed)
Occupational Therapy Discharge Summary  Patient Details  Name: Amber Ruiz MRN: [id number] Date of Birth: 1940-12-19  Date of Discharge from OT service:{Time; dates multiple:304500300}  {CHL IP REHAB OT TIME CALCULATIONS:304400400}   Patient has met {NUMBERS 0-12:18577} of {NUMBERS 0-12:18577} long term goals due to {due TX:5217471}.  Patient to discharge at overall {LOA:3049010} level.  Patient's care partner {care partner:3041650} to provide the necessary {assistance:3041652} assistance at discharge.    Reasons goals not met: ***  Recommendation:  Patient will benefit from ongoing skilled OT services in {setting:3041680} to continue to advance functional skills in the area of {ADL/iADL:3041649}.  Equipment: {equipment:3041657}  Reasons for discharge: {Reason for discharge:3049018}  Patient/family agrees with progress made and goals achieved: {Pt/Family agree with progress/goals:3049020}  OT Discharge Precautions/Restrictions    General   Vital Signs Therapy Vitals Temp: 98.4 F (36.9 C) Pulse Rate: 70 Resp: 18 BP: 138/66 Patient Position (if appropriate): Lying Oxygen Therapy SpO2: 95 % O2 Device: Room Air Pain   ADL ADL Eating: Set up Where Assessed-Eating: Bed level Grooming: Supervision/safety Where Assessed-Grooming: Sitting at sink Upper Body Bathing: Supervision/safety Where Assessed-Upper Body Bathing: Shower Lower Body Bathing: Contact guard Where Assessed-Lower Body Bathing: Shower Upper Body Dressing: Setup Where Assessed-Upper Body Dressing: Wheelchair Lower Body Dressing: Moderate assistance Where Assessed-Lower Body Dressing: Wheelchair Toileting: Moderate assistance Where Assessed-Toileting: Bedside Commode Toilet Transfer: Moderate assistance Toilet Transfer Method: Stand pivot Toilet Transfer Equipment: Radiographer, therapeutic: Moderate assistance Tub/Shower Transfer Method: Stand pivot Tub/Shower Equipment: Leisure centre manager: Curator Method: Print production planner with back ADL Comments: very slow to transition, pain and Proofreader   Sensation   Motor    Mobility     Trunk/Postural Assessment     Balance   Extremity/Trunk Assessment       Precious Haws 07/17/2022, 7:55 AM

## 2022-07-17 NOTE — Progress Notes (Signed)
Speech Language Pathology Daily Session Note  Patient Details  Name: Amber Ruiz MRN: [id number] Date of Birth: 06/23/1941  Today's Date: 07/17/2022 SLP Individual Time: 8527-7824 SLP Individual Time Calculation (min): 59 min  Short Term Goals: Week 2: SLP Short Term Goal 1 (Week 2): STGs=LTGs due to ELOS  Skilled Therapeutic Interventions: Skilled ST services focused on education and cognitive skills. Pt expressed change in d/c plan to 9/1 and hopes to return home with hired help. Pt was verbose, but provided detailed information pertaining to anticipatory awareness and safety at discharge given various functional scenarios. SLP administered reassessment of cognitive skills given SLUMS, pt scored 20/30 (n=>27) indicating improved (18/30 on evaluation date) but continued deficits in immediate and short term recall. Pt supports using written aids at baseline and completes medication/time management independently. SLP recommends focusing on these functional tasks in upcoming treatments. Pt was left in room with call bell within reach and chair alarm set. SLP recommends to continue skilled services.     Pain Pain Assessment Pain Score: 0-No pain  Therapy/Group: Individual Therapy  Severa Jeremiah 07/17/2022, 1:50 PM

## 2022-07-17 NOTE — Progress Notes (Addendum)
PROGRESS NOTE   Subjective/Complaints:  Pt reports still no BM since TH or Friday.   Very rough this AM= needs pain meds.   LE sore, but not pounding.   Asking if can stay longer since doesn't feel prepared to leave tomorrow, since not going to Son's house- going home with help from friend/Daughter?   ROS:  Pt denies SOB, abd pain, CP, N/V/(+) C/D, and vision changes   Objective:   No results found.  Recent Labs    07/17/22 0729  WBC 4.3  HGB 11.4*  HCT 36.8  PLT 446*    Recent Labs    07/17/22 0729  NA 142  K 3.6  CL 107  CO2 27  GLUCOSE 112*  BUN 10  CREATININE 0.68  CALCIUM 9.1     Intake/Output Summary (Last 24 hours) at 07/17/2022 0914 Last data filed at 07/17/2022 9211 Gross per 24 hour  Intake 0 ml  Output 300 ml  Net -300 ml        Physical Exam: Vital Signs Blood pressure 138/66, pulse 70, temperature 98.4 F (36.9 C), resp. rate 18, height 5' (1.524 m), weight 66.1 kg, SpO2 95 %.         General: awake, alert, appropriate, sitting up in bed; finished breakfast- 75% of tray; NAD HENT: conjugate gaze; oropharynx moist CV: regular rate; no JVD Pulmonary: CTA B/L; no W/R/R- good air movement GI: soft, NT, ND, (+)BS Psychiatric: appropriate- concerned about d/c plans Neurological: Ox3- much more awake, alert Musculoskeletal:        General: swelling RLE especially proximally > LLE--stable    Cervical back: Normal range of motion.  Skin:    General: Skin is warm and dry    Comments: Right hip incision  CDI. Surrounding bruising along thigh R>L- outer and inner thigh Neurological:     Mental Status: She is alert and oriented to person, place, and time.     No nystagmus appreciated    Cranial Nerves: CN 2-12 grossly I ntact    Sensory: No sensory deficit.     Coordination: Coordination normal.     Comments: Right hip limited by pain. Otherwise motor 5/5 UE, 3-4+/5 LLE, Right  ADF/APF 4/5. No sensory findings. DTR's 1+     Assessment/Plan: 1. Functional deficits which require 3+ hours per day of interdisciplinary therapy in a comprehensive inpatient rehab setting. Physiatrist is providing close team supervision and 24 hour management of active medical problems listed below. Physiatrist and rehab team continue to assess barriers to discharge/monitor patient progress toward functional and medical goals  Care Tool:  Bathing    Body parts bathed by patient: Right arm, Left arm, Chest, Abdomen, Right upper leg, Left upper leg, Face, Buttocks, Front perineal area, Right lower leg, Left lower leg   Body parts bathed by helper: Right lower leg, Left lower leg     Bathing assist Assist Level: Contact Guard/Touching assist     Upper Body Dressing/Undressing Upper body dressing   What is the patient wearing?: Pull over shirt    Upper body assist Assist Level: Set up assist    Lower Body Dressing/Undressing Lower body dressing  What is the patient wearing?: Pants, Incontinence brief     Lower body assist Assist for lower body dressing: Contact Guard/Touching assist     Toileting Toileting    Toileting assist Assist for toileting: Contact Guard/Touching assist     Transfers Chair/bed transfer  Transfers assist  Chair/bed transfer activity did not occur: Safety/medical concerns  Chair/bed transfer assist level: Contact Guard/Touching assist     Locomotion Ambulation   Ambulation assist   Ambulation activity did not occur: Safety/medical concerns  Assist level: Contact Guard/Touching assist Assistive device: Walker-rolling Max distance: 220   Walk 10 feet activity   Assist  Walk 10 feet activity did not occur: Safety/medical concerns  Assist level: Contact Guard/Touching assist Assistive device: Walker-rolling   Walk 50 feet activity   Assist Walk 50 feet with 2 turns activity did not occur: Safety/medical concerns  Assist  level: Contact Guard/Touching assist Assistive device: Walker-rolling    Walk 150 feet activity   Assist Walk 150 feet activity did not occur: Safety/medical concerns  Assist level: Contact Guard/Touching assist Assistive device: Walker-rolling    Walk 10 feet on uneven surface  activity   Assist Walk 10 feet on uneven surfaces activity did not occur: Safety/medical concerns         Wheelchair     Assist Is the patient using a wheelchair?: Yes Type of Wheelchair: Manual Wheelchair activity did not occur: Safety/medical concerns         Wheelchair 50 feet with 2 turns activity    Assist    Wheelchair 50 feet with 2 turns activity did not occur: Safety/medical concerns       Wheelchair 150 feet activity     Assist  Wheelchair 150 feet activity did not occur: Safety/medical concerns       Blood pressure 138/66, pulse 70, temperature 98.4 F (36.9 C), resp. rate 18, height 5' (1.524 m), weight 66.1 kg, SpO2 95 %. Medical Problem List and Plan: 1. Functional deficits secondary to a right intertrochanteric hip fx s/p ORIF 07/01/22             -patient may shower starting 8/16 if no drainage from incisions             -ELOS/Goals: 10-14 day, mod I with PT and OT  -D/c- will see if can move to Friday  Con't CIR- PT and OT 2.  Antithrombotics: -DVT/anticoagulation:  Pharmaceutical: Lovenox - LE dopplers negative--encourage elevation of legs when possible/ TEDS             -antiplatelet therapy: N/a 3. Pain Management: Hydrocodone prn.   8/28- pain doing better- taking less pain meds 4. Mood/Behavior/Sleep: LCSW to follow for evaluation and support. 8/24- not sleeping- per #13   8/25- slept better- per #13             -antipsychotic agents: N/A 5. Neuropsych/cognition: This patient is capable of making decisions on her own behalf. 6. Skin/Wound Care: Routine pressure relief measures.  7. Fluids/Electrolytes/Nutrition: Monitor I/O.  .   -emptying  without issues currently  -stopped bladder scans  -encourage voiding out of bed when possible 8. Anxiety d/o: Managed with celexa. Intermittent anxiety attacks  -team providing ego support on a daily basis  -have requested neuropsych visit  -would like to avoid benzos if possible, consider low dose xanax prn potentially  8/25- doing ok without benzo's- will con't OFF benzo's 9. ABLA:  .   -HGB down to 8.1 8/15--> 8.8  8/18  8/24- Hb  9.9- con't to monitor  8/28- Hb 11.4- doing better 10. Leucocytosis: wound clean, chest clear. Likely reactive             -Improved to 8.4 8/15  8/21- WBC 7.1k- doing well  8/28- WBC 4.3k 11.  Constipation: improved -continue senna-s.   8/23- no BM with sorbitol- ordered more this afternoon no BM still- will give SSE this evening if no large BM  8/24- LBM last evening large  8/26- Scheduled miralax  8/28- added sorbitol again and SSE 12. Confusion/sedation  8/23- checked U/A and Cx- U/A equivocal for UTI- will wait for U Cx.   8/24- d/w pt need to drink more to push fluids so can avid a UTI. Not drinking enough  8/26 Amoxicillin for 5 days started for Ecoli UTI started yesterday  8/28- cognitively, doing much better 13. Insomnia  8/24- will increase trazodone to 75 mg QHS for sleep and change to scheduled since pt not sleeping well  8/25- slept much better, however still sleepy at 6:30am- explained many people need 8 hours of sleep- she was fine with this- will go back to sleep for now  -Reports sleeping better  8/28- sleeping better   I spent a total of  41   minutes on total care today- >50% coordination of care- due to d/w team about d/c plans/moving d/c date out.    Based on pt's decreased level of function, needs hospital bed for sitting up and down; as well as for transfers to w/c.   LOS: 14 days A FACE TO FACE EVALUATION WAS PERFORMED  Armani Brar 07/17/2022, 9:14 AM

## 2022-07-17 NOTE — Progress Notes (Signed)
Physical Therapy Session Note  Patient Details  Name: Amber Ruiz MRN: [id number] Date of Birth: [birth year]  Today's Date: 07/17/2022 PT Individual Time: 1330-1415 PT Individual Time Calculation (min): 45 min   Short Term Goals: Week 2:  PT Short Term Goal 1 (Week 2): =LTGs d/t ELOS  Skilled Therapeutic Interventions/Progress Updates:    Pt seated in w/c on arrival and agreeable to therapy. Pt reports increased pain this session, unrated. RN made aware and stated would provide medication at end of session. Pt transported to therapy gym for time management and energy conservation. Pt then navigated 6" steps, 2 x 8 with extended seated rest break. First attempt pt used BIL hand rails and required cues for which foot to lead with. Pt then attempted with R hand rail only per home environment, but required BUE support using lateral technique. Pt required CGA but min A for cueing and safety awareness. Pt then ambulated back to room, ~250 ft with supervision and RW. Pt returned to recliner after session and was left with all needs in reach and alarm active.   Therapy Documentation Precautions:  Precautions Precautions: Fall, Other (comment) Precaution Comments: watch BP and SpO2; vertigo Restrictions Weight Bearing Restrictions: Yes RLE Weight Bearing: Weight bearing as tolerated General:       Therapy/Group: Individual Therapy  [name] 07/17/2022, 3:51 PM

## 2022-07-17 NOTE — Progress Notes (Signed)
Physical Therapy Session Note  Patient Details  Name: [name] MRN: [id number] Date of Birth: [birth year]  Today's Date: 07/17/2022 PT Individual Time: 0900-0956 PT Individual Time Calculation (min): 56 min   Short Term Goals: Week 2:  PT Short Term Goal 1 (Week 2): =LTGs d/t ELOS  Skilled Therapeutic Interventions/Progress Updates:    pt received in bed and agreeable to therapy. No complaint of pain at rest, some discomfort with mobility, premedicated. Rest and positioning provided as needed. Pt states she needs to urinate and is very concerned that she has not had a BM and is feeling very poorly because of it. Pt agreeable to attempting toileting. Donned R ted hose with max A to pull over foot, pt able to pull over knee. Reports pants are damp and she needs clean pants. Therapist retrieved scrub pants. Supine>sit with supervision and increased time with bed features. Sit to standn ad ambulatory transfer with RW and close supervision to toilet. Continent B+B void. Documented in flow sheet. Supervision for 3/3 toileting tasks. Noted small streak of blood on tissue when wiping, pt reports she had been straining to have BM. Donned new pants with mod A to thread over feet for time. Pt then performed hand hygiene and morning grooming tasks in standing with supervision for balance. Pt then ambulated x 250 ft to therapy gym with close supervision. Pt demoes improving antalgic gait pattern. Pt noted to have difficulty recalling instructions given while ambulating (turn in the second door on the left), and required max cueing to navigate to gym. Pt then navigated 3" stairs x 8. Cued pt to use R hand like she would at home for front steps. Pt recalls technique with min-mod cueing. Pt required min A for cueing overall. Pt returned to room and remained in w/c, was left with all needs in reach and alarm active.   Therapy Documentation Precautions:  Precautions Precautions: Fall, Other (comment) Precaution  Comments: watch BP and SpO2; vertigo Restrictions Weight Bearing Restrictions: Yes RLE Weight Bearing: Weight bearing as tolerated General:       Therapy/Group: Individual Therapy  [name] 07/17/2022, 9:15 AM

## 2022-07-17 NOTE — Progress Notes (Signed)
Patient ID: Amber Ruiz, female   DOB: 1941/01/23, 81 y.o.   MRN: [id number]  07/15/2022-[facility] accepted referral for HHPT/OT/SLP/aide.   Per medical team, pt will now d/c on 9/1 to allow further gains to be made in rehab. Medical team confirms a hospital bed would be appropriate.   SW returned phone call to pt dtr Amber Ruiz who was inquiring about if hospital bed due to pt bedroom on second floor and havign to navigate stairs. SW shared about about change in d/c date and will order hospital bed. She will follow-up to confirm family edu.   *SW returned phone call and spoke with pt dtr Amber Ruiz to confirm family edu on Wednesday (8/30) 2pm-4pm.   Cecile Sheerer, MSW, LCSWA Office: [phone] Cell: [phone] Fax: [phone]

## 2022-07-17 NOTE — Progress Notes (Signed)
Inpatient Rehabilitation Discharge Medication Review by a Pharmacist  A complete drug regimen review was completed for this patient to identify any potential clinically significant medication issues.  High Risk Drug Classes Is patient taking? Indication by Medication  Antipsychotic No   Anticoagulant No   Antibiotic Yes Amoxicillin po - UTI to complete course  Opioid Yes Vicodin - prn pain  Antiplatelet No   Hypoglycemics/insulin No   Vasoactive Medication No   Chemotherapy No   Other Yes Alendronate - osteoporosis Cetirizine - allergies Citalopram - Mood Iron - Anemia Pantoprazole - GERD Vitamin D - osteoporosis      Type of Medication Issue Identified Description of Issue Recommendation(s)  Drug Interaction(s) (clinically significant)     Duplicate Therapy     Allergy     No Medication Administration End Date     Incorrect Dose     Additional Drug Therapy Needed     Significant med changes from prior encounter (inform family/care partners about these prior to discharge).    Other       Clinically significant medication issues were identified that warrant physician communication and completion of prescribed/recommended actions by midnight of the next day:  No  Pharmacist comments: None  Time spent performing this drug regimen review (minutes):  30 minutes   Elwin Sleight 07/17/2022 8:41 AM

## 2022-07-17 NOTE — Progress Notes (Signed)
Occupational Therapy Session Note  Patient Details  Name: Amber Ruiz MRN: [id number] Date of Birth: 24-Aug-1941  Today's Date: 07/17/2022 OT Individual Time: 7425-9563 OT Individual Time Calculation (min): 60 min    Short Term Goals: Week 1:  OT Short Term Goal 1 (Week 1): Pt will perform all UB self care with close S OT Short Term Goal 1 - Progress (Week 1): Met OT Short Term Goal 2 (Week 1): Pt will perform LB bathing and dressing with min a with AE/DME OT Short Term Goal 2 - Progress (Week 1): Met OT Short Term Goal 3 (Week 1): Pt wll transfer to shower and toilet with DME with min A OT Short Term Goal 3 - Progress (Week 1): Met OT Short Term Goal 4 (Week 1): Pt will stand for grooming sink side with CGA up to 5 min OT Short Term Goal 4 - Progress (Week 1): Met  Skilled Therapeutic Interventions/Progress Updates:  Pt greeted seated in w/c, pt  agreeable to OT intervention. Session focus on BADL reeducation, functional mobility, dynamic standing balance and decreasing overall caregiver burden.    Pt completed ambulatory shower transfer to walk in shower with RW and CGA, pt needs step by step cues to sequence body mechanics during shower transfer.  Of note this OTA has seen pt once before and noted that pt states same responses as previous sessions such as " can I stay in the shower all day?" "Water is so therapeutic." Pt also noted to ask for help with getting the water warm however pt had the water on cold vs hot.  Pt completed bathing with overall MIN A needing assist to wash lower legs d/t pain, education provided on using LH sponge for home. Pt exited shower in same manner however provided MIN A for + safety d/t wet environment. Pt continued to require step by step cues to sequence transfer.  Pt completed dressing from w/c with pt needing set- up assist for UB dressing and CGA for LB Dressing. Pt needed total A to don TEDS and socks from w/c. Pt completed seated oral care at sink  MODI.  Pt left seated in w/c with alarm belt activated and all needs within reach.                                  Therapy Documentation Precautions:  Precautions Precautions: Fall, Other (comment) Precaution Comments: watch BP and SpO2; vertigo Restrictions Weight Bearing Restrictions: Yes RLE Weight Bearing: Weight bearing as tolerated  Pain: No pain reported during session    Therapy/Group: Individual Therapy  Precious Haws 07/17/2022, 12:10 PM

## 2022-07-18 MED ORDER — TRAZODONE HCL 50 MG PO TABS
50.0000 mg | ORAL_TABLET | Freq: Every evening | ORAL | Status: DC | PRN
  Administered 2022-07-18: 50 mg via ORAL
  Filled 2022-07-18: qty 1

## 2022-07-18 NOTE — Progress Notes (Signed)
Speech Language Pathology Daily Session Note  Patient Details  Name: Amber Ruiz MRN: [id number] Date of Birth: 06-14-1941  Today's Date: 07/18/2022 SLP Individual Time: 1100-1200 SLP Individual Time Calculation (min): 60 min  Short Term Goals: Week 2: SLP Short Term Goal 1 (Week 2): STGs=LTGs due to ELOS  Skilled Therapeutic Interventions: Skilled ST treatment focused on cognitive goals. Pt received upright in wheelchair on arrival. Pt continues to be hyperverbal requiring increased verbal redirection today. At one point pt acknowledged talkative nature and expressed it was unusual for her to be this talkative. Pt supports feeling depressed prior to hospitalization and reported she has thoroughly enjoyed the opportunity to talk to others and expressed "it brought me out of my shell."   SLP facilitated functional discussion regarding needs at discharge. Pt demonstrated emergent and anticipatory awareness with sup A verbal cues. SLP then facilitated sustained attention and working memory task through [employer]. Pt completed x3 with each trial improving in accuracy and and implementation of internal memory strategies including verbal/visual repetition, association, and visualization. Pt implemented effectively with sup A verbal cues, as well as intermittent verbal redirection cues for attention to task due to tendency to initiate side conversation/comments. SLP educated further on strategies to maximize attention and memory in home environment. Pt verbalized understanding through teach back.  Patient was left in wheelchair with alarm activated and immediate needs within reach at end of session. Continue per current plan of care.      Pain  None/denied  Therapy/Group: Individual Therapy  [name] 07/18/2022, 6:25 PM

## 2022-07-18 NOTE — Progress Notes (Signed)
Physical Therapy Session Note  Patient Details  Name: Amber Ruiz MRN: [id number] Date of Birth: 01/05/1941  Today's Date: 07/18/2022 PT Individual Time: 7681-1572 PT Individual Time Calculation (min): 42 min   Short Term Goals: Week 1:  PT Short Term Goal 1 (Week 1): Pt will ambulate >25 ft with LRAD PT Short Term Goal 1 - Progress (Week 1): Met PT Short Term Goal 2 (Week 1): Pt will initiate stair training PT Short Term Goal 2 - Progress (Week 1): Met PT Short Term Goal 3 (Week 1): Pt will perform bed mobility with mod A or better PT Short Term Goal 3 - Progress (Week 1): Met Week 2:  PT Short Term Goal 1 (Week 2): =LTGs d/t ELOS  Skilled Therapeutic Interventions/Progress Updates:  Patient seated upright in w/c on entrance to room. Nursing student present and taking vitals on entrance. Patient alert and agreeable to PT session.   Patient with no pain complaint at start of session while seated.  Therapeutic Activity: Transfers: Pt performed sit<>stand and stand pivot transfers throughout session with supervision. Provided verbal cues for forward lean to improve rise to stand and placing RLE ahead of LLE during descent to sit to decrease pain/ pressure in LE.  Gait Training:  Pt ambulated 618 ft using RW with close supervision and w/c follow for pain/ fatigue. One 180 deg turn mid-distance. Initially demonstrated heavy BUE pressure into RW during RLE stance phase. After cueing for upright stance and decreased use of BUE, pt is able to demo improved smooth advancement of RW throughout return trip to room.  Pt relates decreased pain during ambulation when engaged in conversation throughout.   Patient seated at end of session with brakes locked, belt alarm set, and all needs within reach.   Therapy Documentation Precautions:  Precautions Precautions: Fall, Other (comment) Precaution Comments: watch BP and SpO2; vertigo Restrictions Weight Bearing Restrictions: Yes RLE Weight  Bearing: Weight bearing as tolerated General:   Vital Signs: Therapy Vitals Temp: 98.4 F (36.9 C) Temp Source: Oral Pulse Rate: 75 Resp: 20 BP: (!) 110/42 Patient Position (if appropriate): Sitting Oxygen Therapy SpO2: 99 % O2 Device: Room Air Pain:  Pt c/o moderate but tolerable pain with ambulation this session.    Therapy/Group: Individual Therapy  Alger Simons PT, DPT, CSRS 07/18/2022, 5:40 PM

## 2022-07-18 NOTE — Progress Notes (Signed)
Physical Therapy Session Note  Patient Details  Name: Amber Ruiz MRN: [id number] Date of Birth: [birth year]  Today's Date: 07/18/2022 PT Individual Time: 0800-0826 PT Individual Time Calculation (min): 26 min   Short Term Goals: Week 2:  PT Short Term Goal 1 (Week 2): =LTGs d/t ELOS  Skilled Therapeutic Interventions/Progress Updates:    Pt asleep on arrival and c/o feeling drowsy/difficulty rousing. Pt reports mild hip pain, premedicated. Rest and positioning provided as needed. Pt performed supine>sit with supervision and increased time, perseverating on feeling drowsy and telling this therapist multiple times that her doctor had talked about reducing her medicines. Supine>sit with supervision and increased time with bed features. Sit to stand and ambulatory transfer to bathroom with supervision and increased time with RW. Pt noted to have wet brief. Continent bladder void once on toilet, supervision for 3/3 toileting tasks. Cues required for sequencing of task, remembering to pull up pants, wash hands, etc. Pt remained seated in w/c at end of session, and was left with all needs in reach and alarm active.   Therapy Documentation Precautions:  Precautions Precautions: Fall, Other (comment) Precaution Comments: watch BP and SpO2; vertigo Restrictions Weight Bearing Restrictions: Yes RLE Weight Bearing: Weight bearing as tolerated General:      Therapy/Group: Individual Therapy  [name] 07/18/2022, 8:16 AM

## 2022-07-18 NOTE — Progress Notes (Signed)
Occupational Therapy Session Note  Patient Details  Name: Amber Ruiz MRN: [id number] Date of Birth: November 04, 1941  Today's Date: 07/18/2022 OT Individual Time: 4008-6761 OT Individual Time Calculation (min): 75 min    Short Term Goals: Week 1:  OT Short Term Goal 1 (Week 1): Pt will perform all UB self care with close S OT Short Term Goal 1 - Progress (Week 1): Met OT Short Term Goal 2 (Week 1): Pt will perform LB bathing and dressing with min a with AE/DME OT Short Term Goal 2 - Progress (Week 1): Met OT Short Term Goal 3 (Week 1): Pt wll transfer to shower and toilet with DME with min A OT Short Term Goal 3 - Progress (Week 1): Met OT Short Term Goal 4 (Week 1): Pt will stand for grooming sink side with CGA up to 5 min OT Short Term Goal 4 - Progress (Week 1): Met Week 2:  OT Short Term Goal 1 (Week 2): STGS = LTGS  Skilled Therapeutic Interventions/Progress Updates:    Pt received in wc finishing breakfast. Pt agreeable to showering today. Discussed with pt the goals of the session were to see what she could recall for safe transfer techniques to determine how much cuing she would need, problem solve getting in and out of the shower, and being more independent in shower with long handled sponge.   Pt participated extremely well this session.  Encouraged pt to not try to talk so much as she was working on her transition movements so she could focus. Pt completed all sit to stands from w/c, arm chair and shower chair with NO cues and supervision only!     Pt ambulated with RW to shower.  She worked on stepping backwards leading with L leg into shower over towel threshold and then stepping forward out of the shower leading with R leg. Pt did extremely well with S.  In shower she used long sponge to reach feet and used bar for support as she stood to wash her bottom.    Pt dried off and then ambulated to w/c to dress but the seat was too high so pt transferred to arm chair and this  worked more easily.  Did not have a reacher with me for pt to trial today so she needed a small amount of A to start over feet but then pt stood and donned over hips with supervision. Pt may benefit from elastic laces for R shoe, she can cross her L leg to tie L shoes.  Also suggested 2 brands of step in shoes.  Pt returned to wc at end of session. Pt in room with nursing student and nursing student planned to get her set up at sink so she could complete oral care.  Therapy Documentation Precautions:  Precautions Precautions: Fall, Other (comment) Precaution Comments: watch BP and SpO2; vertigo Restrictions Weight Bearing Restrictions: Yes RLE Weight Bearing: Weight bearing as tolerated  Pain: Pain Assessment Pain Scale: 0-10 Pain Score: 0-No pain ADL: Grooming: Supervision/safety Where Assessed-Grooming: Sitting at sink Upper Body Bathing: Supervision/safety Where Assessed-Upper Body Bathing: Shower Lower Body Bathing: Supervision/safety Where Assessed-Lower Body Bathing: Shower Upper Body Dressing: Setup Where Assessed-Upper Body Dressing: Chair Lower Body Dressing: Contact guard Where Assessed-Lower Body Dressing: Chair Toileting: Moderate assistance Where Assessed-Toileting: Bedside Commode Toilet Transfer: Moderate assistance Toilet Transfer Method: Stand pivot Science writer: Geophysical data processor: Close supervision Social research officer, government Method: Heritage manager: Shower seat with back  Therapy/Group: Individual Therapy  [place] 07/18/2022, 12:44 PM

## 2022-07-18 NOTE — Patient Care Conference (Signed)
Inpatient RehabilitationTeam Conference and Plan of Care Update Date: 07/18/2022   Time: 11:27 AM    Patient Name: Amber Ruiz      Medical Record Number: [id number]  Date of Birth: Mar 24, 1941 Sex: Female         Room/Bed: 4M11C/4M11C-01 Payor Info: Payor: MEDICARE / Plan: MEDICARE PART A AND B / Product Type: *No Product type* /    Admit Date/Time:  07/03/2022  5:49 PM  Primary Diagnosis:  Femur fracture, right [facility])  Hospital Problems: Principal Problem:   Femur fracture, right [facility]) Active Problems:   Anxiety reaction    Expected Discharge Date: Expected Discharge Date: 07/21/22  Team Members Present: Physician leading conference: Dr. Genice Rouge Social Worker Present: Cecile Sheerer, LCSWA Nurse Present: Other (comment) Vedia Pereyra, RN) PT Present: Bernie Covey, PT OT Present: Valetta Fuller, OT SLP Present: Gerda Diss, SLP PPS Coordinator present : Fae Pippin, SLP     Current Status/Progress Goal Weekly Team Focus  Bowel/Bladder   Pt continent of b/b. LBM 8/28  Remain continent of b/b  Assit with toileting qshift and prn   Swallow/Nutrition/ Hydration             ADL's   set up UB self care, grooming; CGA LB self care with AE, transfers CGA/close S toilet and shower bench  S  Continue to progress transfers and amb to S and S BIADL's with AE/DME amb level   Mobility   Supervision overall for mobility, min A stairs for cueing, vestibular symptoms largely resolved after tx  supervision  fam ed, pain, d/c plan, gait, stairs.   Communication             Safety/Cognition/ Behavioral Observations  Supervision A, 20/30 on SLUMS recall baseline deficits  supervision A - Mod I  medication/time management, recall strategies   Pain   c/o pain from surgical site on the LLE, no prn meds given.  pain level <3/10  Asssess pain qshift and prn   Skin   Surgical incision on LLE (ota)  Remain free from new skin breakdown  Assess skin qshift and prn      Discharge Planning:  Pt will d/c to home with hired help a few times per week including overnight. Fam edu scheduled on Wednesday (*8/30) 2pm-4pm with pt son,dtr, and hired caregiver/friend.   Team Discussion: Femur fracture. Continent x 2. LBM 08/2/ after Miralax and sorbitol. Pain controlled with PRNs. Incision CDI. Trazodone decreased. Family education 08/30. Discharge extended for family education and hospital bed. Patient on target to meet rehab goals: yes, family education pending.  *See Care Plan and progress notes for long and short-term goals.   Revisions to Treatment Plan:  Medication adjustments, arrangement for family education   Teaching Needs: Medications, safety, skin/wound care, gait/transfer training, etc.  Current Barriers to Discharge: Decreased caregiver support and Home enviroment access/layout  Possible Resolutions to Barriers: Family education, order recommended DME     Medical Summary Current Status: continent of B/B- LBM yesterday- no drainage in incision- family ed tomorrow- very constipated-  Barriers to Discharge: Decreased family/caregiver support;Home enviroment access/layout;Weight bearing restrictions;Weight;Wound care  Barriers to Discharge Comments: lots of cues and internally distracted- cognition at baseline Possible Resolutions to Barriers/Weekly Focus: S/U OT CGA LB OT- superivsion for PT- Supervision to mod I SLP- at Supervision right now- d/c 9/1   Continued Need for Acute Rehabilitation Level of Care: The patient requires daily medical management by a physician with specialized training in physical medicine and rehabilitation  for the following reasons: Direction of a multidisciplinary physical rehabilitation program to maximize functional independence : Yes Medical management of patient stability for increased activity during participation in an intensive rehabilitation regime.: Yes Analysis of laboratory values and/or radiology reports  with any subsequent need for medication adjustment and/or medical intervention. : Yes   I attest that I was present, lead the team conference, and concur with the assessment and plan of the team.   Jearld Adjutant 07/18/2022, 4:33 PM

## 2022-07-18 NOTE — Progress Notes (Signed)
PROGRESS NOTE   Subjective/Complaints:  Pt reports BM yesterday AM- but no response to Sorbitol and SSE yesterday afternoon and still feels constipated.   Pain overall is "fine" getting much better.  Cannot wake up so well last few days- sleeping great, but thinks meds a little too much maybe.   ROS:  Pt denies SOB, abd pain, CP, N/V/C/D, and vision changes    Objective:   DG HIP UNILAT WITH PELVIS 2-3 VIEWS RIGHT  Result Date: 07/17/2022 CLINICAL DATA:  Surgery follow-up EXAM: DG HIP (WITH OR WITHOUT PELVIS) 3V RIGHT COMPARISON:  Hip radiograph dated July 02, 2022 FINDINGS: Prior right proximal femur intramedullary nail placement or right intratrochanteric fracture. No evidence of new fracture. Mild degenerative changes of the bilateral hips. Soft tissues are unremarkable. IMPRESSION: Prior right proximal femur intramedullary nail placement. Electronically Signed   By: Allegra Lai M.D.   On: 07/17/2022 15:25    Recent Labs    07/17/22 0729  WBC 4.3  HGB 11.4*  HCT 36.8  PLT 446*    Recent Labs    07/17/22 0729  NA 142  K 3.6  CL 107  CO2 27  GLUCOSE 112*  BUN 10  CREATININE 0.68  CALCIUM 9.1     Intake/Output Summary (Last 24 hours) at 07/18/2022 0846 Last data filed at 07/17/2022 1900 Gross per 24 hour  Intake 120 ml  Output --  Net 120 ml        Physical Exam: Vital Signs Blood pressure (!) 122/56, pulse 70, temperature 98.6 F (37 C), resp. rate 16, height 5' (1.524 m), weight 66.1 kg, SpO2 96 %.          General: awake, alert, appropriate, supine in bed with therapy monkey around neck; NAD HENT: conjugate gaze; oropharynx moist CV: regular rate; no JVD Pulmonary: CTA B/L; no W/R/R- good air movement GI: soft, NT, slightly distended-mild- (+) hypoactive BS Psychiatric: appropriate- more interactive- but sleepy Neurological: Ox3- but still very sleepy Musculoskeletal:         General: swelling RLE especially proximally > LLE--stable    Cervical back: Normal range of motion.  Skin:    General: Skin is warm and dry    Comments: Right hip incision  CDI. Surrounding bruising along thigh R>L- outer and inner thigh Neurological:     Mental Status: She is alert and oriented to person, place, and time.     No nystagmus appreciated    Cranial Nerves: CN 2-12 grossly I ntact    Sensory: No sensory deficit.     Coordination: Coordination normal.     Comments: Right hip limited by pain. Otherwise motor 5/5 UE, 3-4+/5 LLE, Right ADF/APF 4/5. No sensory findings. DTR's 1+     Assessment/Plan: 1. Functional deficits which require 3+ hours per day of interdisciplinary therapy in a comprehensive inpatient rehab setting. Physiatrist is providing close team supervision and 24 hour management of active medical problems listed below. Physiatrist and rehab team continue to assess barriers to discharge/monitor patient progress toward functional and medical goals  Care Tool:  Bathing    Body parts bathed by patient: Right arm, Left arm, Chest, Abdomen, Right upper leg, Face,  Buttocks, Front perineal area, Left upper leg   Body parts bathed by helper: Right lower leg, Left lower leg     Bathing assist Assist Level: Minimal Assistance - Patient > 75%     Upper Body Dressing/Undressing Upper body dressing   What is the patient wearing?: Pull over shirt, Bra    Upper body assist Assist Level: Set up assist    Lower Body Dressing/Undressing Lower body dressing      What is the patient wearing?: Pants, Incontinence brief     Lower body assist Assist for lower body dressing: Contact Guard/Touching assist     Toileting Toileting    Toileting assist Assist for toileting: Contact Guard/Touching assist     Transfers Chair/bed transfer  Transfers assist  Chair/bed transfer activity did not occur: Safety/medical concerns  Chair/bed transfer assist level: Contact  Guard/Touching assist     Locomotion Ambulation   Ambulation assist   Ambulation activity did not occur: Safety/medical concerns  Assist level: Contact Guard/Touching assist Assistive device: Walker-rolling Max distance: 220   Walk 10 feet activity   Assist  Walk 10 feet activity did not occur: Safety/medical concerns  Assist level: Contact Guard/Touching assist Assistive device: Walker-rolling   Walk 50 feet activity   Assist Walk 50 feet with 2 turns activity did not occur: Safety/medical concerns  Assist level: Contact Guard/Touching assist Assistive device: Walker-rolling    Walk 150 feet activity   Assist Walk 150 feet activity did not occur: Safety/medical concerns  Assist level: Contact Guard/Touching assist Assistive device: Walker-rolling    Walk 10 feet on uneven surface  activity   Assist Walk 10 feet on uneven surfaces activity did not occur: Safety/medical concerns         Wheelchair     Assist Is the patient using a wheelchair?: Yes Type of Wheelchair: Manual Wheelchair activity did not occur: Safety/medical concerns         Wheelchair 50 feet with 2 turns activity    Assist    Wheelchair 50 feet with 2 turns activity did not occur: Safety/medical concerns       Wheelchair 150 feet activity     Assist  Wheelchair 150 feet activity did not occur: Safety/medical concerns       Blood pressure (!) 122/56, pulse 70, temperature 98.6 F (37 C), resp. rate 16, height 5' (1.524 m), weight 66.1 kg, SpO2 96 %. Medical Problem List and Plan: 1. Functional deficits secondary to a right intertrochanteric hip fx s/p ORIF 07/01/22             -patient may shower starting 8/16 if no drainage from incisions             -ELOS/Goals: 10-14 day, mod I with PT and OT  Con't CIR- PT and OT- team conference today to finalize d/c on Friday 9/1 2.  Antithrombotics: -DVT/anticoagulation:  Pharmaceutical: Lovenox - LE dopplers  negative--encourage elevation of legs when possible/ TEDS             -antiplatelet therapy: N/a 3. Pain Management: Hydrocodone prn.   8/28- pain doing better- taking less pain meds 4. Mood/Behavior/Sleep: LCSW to follow for evaluation and support. 8/24- not sleeping- per #13   8/25- slept better- per #13             -antipsychotic agents: N/A 5. Neuropsych/cognition: This patient is capable of making decisions on her own behalf. 6. Skin/Wound Care: Routine pressure relief measures.  7. Fluids/Electrolytes/Nutrition: Monitor I/O.  .   -  emptying without issues currently  -stopped bladder scans  -encourage voiding out of bed when possible 8. Anxiety d/o: Managed with celexa. Intermittent anxiety attacks  -team providing ego support on a daily basis  -have requested neuropsych visit  -would like to avoid benzos if possible, consider low dose xanax prn potentially  8/25- doing ok without benzo's- will con't OFF benzo's 9. ABLA:  .   -HGB down to 8.1 8/15--> 8.8  8/18  8/24- Hb 9.9- con't to monitor  8/28- Hb 11.4- doing better 10. Leucocytosis: wound clean, chest clear. Likely reactive             -Improved to 8.4 8/15  8/21- WBC 7.1k- doing well  8/28- WBC 4.3k 11.  Constipation: improved -continue senna-s.   8/23- no BM with sorbitol- ordered more this afternoon no BM still- will give SSE this evening if no large BM  8/24- LBM last evening large  8/26- Scheduled miralax  8/28- added sorbitol again and SSE  8/29- had BM yesterday AM, but no results with enema/sorbitol last evening so far- will monitor- if no BM by tomorrow, will get KUB 12. Confusion/sedation  8/23- checked U/A and Cx- U/A equivocal for UTI- will wait for U Cx.   8/24- d/w pt need to drink more to push fluids so can avid a UTI. Not drinking enough  8/26 Amoxicillin for 5 days started for Ecoli UTI started yesterday  8/28- cognitively, doing much better 13. Insomnia  8/24- will increase trazodone to 75 mg QHS for  sleep and change to scheduled since pt not sleeping well  8/25- slept much better, however still sleepy at 6:30am- explained many people need 8 hours of sleep- she was fine with this- will go back to sleep for now  8/29- sleeping with hangover in Am- will reduce Trazodone to 50 mg QHS prn   I spent a total of 36   minutes on total care today- >50% coordination of care- due to team conference to finalize d/c and discussion with pt about d/c and reduction in trazodone.   Based on pt's decreased level of function, needs hospital bed for sitting up and down; as well as for transfers to w/c.   LOS: 15 days A FACE TO FACE EVALUATION WAS PERFORMED  Scarlett Portlock 07/18/2022, 8:46 AM

## 2022-07-18 NOTE — Progress Notes (Signed)
Patient ID: Amber Ruiz, female   DOB: 11-Aug-1941, 81 y.o.   MRN: [id number]  Per OT, would like for pt to have a 3in1 BSC and shower seat with back.   SW left message for pt dtr Amber Ruiz on above items ordered and shower seat being a private purchase item. Also inquired if hospital bed delivered to home yet. SW waiting on follow-up.   *SW received updates from Christina/[facility] reporting that son requested hospital bed be delivered to home tomorrow.  SW spoke with pt dtr Amber Ruiz to discuss above. Reports she would like DME to be delivered to home today if possible, if not tomorrow is ok. SW informed [facility], and SW will follow-up if there is more information.   Cecile Sheerer, MSW, LCSWA Office: [phone] Cell: [phone] Fax: [phone]

## 2022-07-19 ENCOUNTER — Inpatient Hospital Stay (HOSPITAL_COMMUNITY): Payer: Medicare Other

## 2022-07-19 MED ORDER — TRAZODONE HCL 50 MG PO TABS
25.0000 mg | ORAL_TABLET | Freq: Every evening | ORAL | Status: DC | PRN
  Administered 2022-07-19: 25 mg via ORAL
  Filled 2022-07-19: qty 1

## 2022-07-19 MED ORDER — POLYETHYLENE GLYCOL 3350 17 GM/SCOOP PO POWD
1.0000 | Freq: Once | ORAL | Status: AC
  Administered 2022-07-19: 255 g via ORAL
  Filled 2022-07-19: qty 255

## 2022-07-19 NOTE — Progress Notes (Signed)
Physical Therapy Session Note  Patient Details  Name: Amber Ruiz MRN: [id number] Date of Birth: 02/27/1941  Today's Date: 07/19/2022 PT Individual Time: 1007-1140 PT Individual Time Calculation (min): 93 min   Short Term Goals: Week 2:  PT Short Term Goal 1 (Week 2): =LTGs d/t ELOS  Skilled Therapeutic Interventions/Progress Updates: Pt presented in recliner with ortho PA and Pam PA present. Once conversations with PAs completed pt agreeable to therapy. Pt states only some "soreness" (did not rate). PTA lowered leg rests and pt requesting to use toilet prior to leaving room. Pt performed ambulatory transfer to bathroom with supervision overall and RW. Pt with continent void and performed 3/3 toileting tasks with supervision. Pt then ambulated to sink and performed hand and oral hygiene in standing with supervision. Pt was able to release RW and perform hygiene tasks with B hands without demonstrating any instability. Pt then ambulated to rehab gym and participated in obstacle course including weaving through cones, stepping over obstacles and ambulating on compliant/uneven surfaces. Pt required verbal cues for safety and sequencing however did not require any physical assist for any activities. Pt did take increased time and required increased time between bouts due to fatigue as well as pt indicating some increase in pain with activity which decreased with rest. Pt then ambulated to ortho gym and participated in NuStep L4 x 10 min then x 5 min BLE only for general conditioning and BLE strengthening. At end of NuStep activity therapy dog Pearl arrived. Pt performed reaching tasks in standing to pet dog with supervision. Pt then ambulated back to room with supervision taking intermittent rest breaks due to fatigue and pt returned to recliner in room. Pt left in room at end of session with call bell within reach and needs met.      Therapy Documentation Precautions:  Precautions Precautions: Fall,  Other (comment) Precaution Comments: watch BP and SpO2; vertigo Restrictions Weight Bearing Restrictions: Yes RLE Weight Bearing: Weight bearing as tolerated   Therapy/Group: Individual Therapy  Rosita DeChalus Rosita DeChalus, PTA  07/19/2022, 12:39 PM  

## 2022-07-19 NOTE — Progress Notes (Signed)
Physical Therapy Session Note  Patient Details  Name: Amber Ruiz MRN: [id number] Date of Birth: [birth year]  Today's Date: 07/19/2022 PT Individual Time: 1532-1610 PT Individual Time Calculation (min): 38 min   Short Term Goals: Week 2:  PT Short Term Goal 1 (Week 2): =LTGs d/t ELOS  Skilled Therapeutic Interventions/Progress Updates: Pt presented in w/c hand from from [facility] for family ed. Son and dgt present as well as neighbor Amber Ruiz who will be staying with pt after d/c. PTA explained that pt is currently at supervision level overall. Pt demonstrated ambulation from ADL apt to stairs and then was able to ascend/descend x 12 steps with R rail ascending L rail descending with supervision with pt requiring intermittent verbal cues for sequencing. Pt was then transported for time management to ortho gym and demonstrated car transfer with verbal cues for supervision at compact SUV height. PTA explained that pt has been able to perform all transfers, ambulation, and toileting tasks at supervision level as well as can tolerate standing for extended periods of time to perform small tasks. Amber Ruiz and family verbalized understanding and family pleased with pt's progress. PTA also provided information that pt has received vestibular evaluation and has been treated for symptoms as well as has received VOR exercises. PTA also advised that vestibular therapists avail in [facility] system if symptoms should arise again. Family verbalized understanding. Pt transported back to room and pt performed ambulatory transfer to recliner with RW and supervision. Pt left in recliner at end of session with belt alarm on, call bell within reach and needs met.      Therapy Documentation Precautions:  Precautions Precautions: Fall, Other (comment) Precaution Comments: watch BP and SpO2; vertigo Restrictions Weight Bearing Restrictions: Yes RLE Weight Bearing: Weight bearing as tolerated General:   Vital Signs: Therapy  Vitals Temp: 98.4 F (36.9 C) Temp Source: Oral Pulse Rate: 84 Resp: 17 BP: (!) 120/59 Patient Position (if appropriate): Lying Oxygen Therapy SpO2: 100 % O2 Device: Room Air Pain:   Mobility:   Locomotion :    Trunk/Postural Assessment :    Balance:   Exercises:   Other Treatments:      Therapy/Group: Individual Therapy  Dam Ashraf 07/19/2022, 4:28 PM

## 2022-07-19 NOTE — Progress Notes (Signed)
Occupational Therapy Session Note  Patient Details  Name: Amber Ruiz MRN: [id number] Date of Birth: 01-23-1941  Today's Date: 07/19/2022 OT Individual Time: 9798-9211 OT Individual Time Calculation (min): 30 min    Short Term Goals: Week 2:  OT Short Term Goal 1 (Week 2): STGS = LTGS  Skilled Therapeutic Interventions/Progress Updates:  Pt greeted seated in recliner with family and caregiver present for family ed. General education provided on pts current level of function being overall supervision with Rw and 24/7 supervision.  Reviewed DME with family with recommendation for shower seat and BSC over toilet to assist with sit>stand transitions.  Pt demo'ed abilty to ambulate to bathroom with supervision with Rw, education provided to family on having caregiver close by during all functional mobility and ADL participation. Pt completed 3/3 toileting tasks with supervision.  Transported pt in w/c to apt with total A for time mgmt.  Discussed shower set up of walkin shower, recommended pt step in backwards to shower with Rw.  Pt completed shower transfer stepping in backwards with rw with CGA. Caregiver Kathie Rhodes also demonstrated competency with assisting pt in/out of shower. Education provided on pt being at supervision level for bathing with LB AE PRN.  Pt handed off in apt to PTA for next session.                  Therapy Documentation Precautions:  Precautions Precautions: Fall, Other (comment) Precaution Comments: watch BP and SpO2; vertigo Restrictions Weight Bearing Restrictions: Yes RLE Weight Bearing: Weight bearing as tolerated  Pain: no pan reported during session     Therapy/Group: Individual Therapy  Barron Schmid 07/19/2022, 3:57 PM

## 2022-07-19 NOTE — Progress Notes (Signed)
Orthopaedic Trauma Progress Note  Doing well this morning.  Progressing well with therapies. Feels ready to go home at the end of the week. Has been dealing with some swelling in the left leg which is to be expected given her injury and surgery. No issues with her incisions. Has good knee motion.  No other specific complaints.  Anticipated discharge 07/18/2022.  IMAGING: Repeat imaging R hip done 07/17/22 stable   ASSESSMENT: Amber Ruiz is a 81 y.o. female s/p INTRAMEDULLARY  NAIL RIGHT INTERTROCHANTERIC FEMUR FRACTURE 07/02/22   PLAN: Weightbearing: WBAT RLE ROM: Okay for unrestricted hip and knee motion as tolerated Incisional and dressing care: OK to leave incisions open to air Showering: Okay to shower and get incisions wet  Orthopedic device(s): None  Pain management: continue current regimen per CIR VTE prophylaxis: Lovenox, SCDs Foley/Lines:  No foley, KVO IVFs Impediments to Fracture Healing: Vitamin D level 19, started on D supplementation. Dispo: Care per CIR. Recommend transition to Eliquis 2.5 mg BID x 30 days at /c for DVT ppx. Continue Vit D supplementation x8 weeks  Follow - up plan: 2 weeks after discharge for wound check and repeat x-rays   Contact information:  Truitt Merle MD, Thyra Breed PA-C. After hours and holidays please check [email or web address] for group call information for [facility]   Thompson Caul, PA-C [phone] (office) [email or web address]

## 2022-07-19 NOTE — Progress Notes (Signed)
Physical Therapy Session Note  Patient Details  Name: Amber Ruiz MRN: [id number] Date of Birth: January 04, 1941  Today's Date: 07/19/2022 PT Individual Time: 0730-0825 PT Individual Time Calculation (min): 55 min   Short Term Goals: Week 1:  PT Short Term Goal 1 (Week 1): Pt will ambulate >25 ft with LRAD PT Short Term Goal 1 - Progress (Week 1): Met PT Short Term Goal 2 (Week 1): Pt will initiate stair training PT Short Term Goal 2 - Progress (Week 1): Met PT Short Term Goal 3 (Week 1): Pt will perform bed mobility with mod A or better PT Short Term Goal 3 - Progress (Week 1): Met Week 2:  PT Short Term Goal 1 (Week 2): =LTGs d/t ELOS  Skilled Therapeutic Interventions/Progress Updates:   Received pt semi-reclined in bed with call light going off, reporting urge to use bathroom. Pt agreeable to PT treatment and reported feeling drowsy but denied any pain at rest - increased with mobility but did not rate. Session with emphasis on functional mobility/transfers, toileting, gait training, and stair navigation. Donned R ted hose with total A for edema management and pt transferred semi-reclined<>sitting EOB with HOB elevated and supervision. Donned L sock and shoes with max A for time purposes. Pt performed all transfers with RW and supervision throughout session. Pt ambulated to/from bathroom with RW and supervision and able to manage clothing without assist. Pt continent of bowel and bladder and performed hygiene management without assist. Pt stood at sink and washed hands/brushed teeth/washed face with distant supervision then RN arrived to administer medications - took meds standing. Pt ambulated 145f with RW and supervision to main therapy gym - frequently distracted talking but no LOB noted. Pt then navigated 8 steps with R handrail and CGA fading to supervision using a lateral stepping technique to simulate home environment - pt with good recall of "up with the good, down with the bad"  technique. [facility] of increased nausea. Concluded session with pt sitting in WC eating breakfast, needs within reach, and seatbelt alarm on.   Therapy Documentation Precautions:  Precautions Precautions: Fall, Other (comment) Precaution Comments: watch BP and SpO2; vertigo Restrictions Weight Bearing Restrictions: Yes RLE Weight Bearing: Weight bearing as tolerated  Therapy/Group: Individual Therapy AAlfonse AlpersPT, DPT  07/19/2022, 6:52 AM

## 2022-07-19 NOTE — Progress Notes (Signed)
Speech Language Pathology Daily Session Note  Patient Details  Name: Amber Ruiz MRN: [id number] Date of Birth: [birth year]  Today's Date: 07/19/2022 SLP Individual Time: 1400-1434 SLP Individual Time Calculation (min): 34 min  Short Term Goals: Week 2: SLP Short Term Goal 1 (Week 2): STGs=LTGs due to ELOS  Skilled Therapeutic Interventions: Skilled ST treatment focused on education with pt's son and daughter. SLP educated on cognitive-communication deficits, strategies to enhance memory and attention, internal/external memory aids, recommendations for follow up SLP services in home environment, as well as frequent check-ins/supervision at discharge. Family supports gradual memory decline at baseline and concern for depression in which they have attempted to address through therapy and MD visits. Family supports pt is likely near baseline from a cognitive standpoint and aware that continued support will be necessary moving forward from a safety standpoint. They plan to provide intermittent supervision, as well as hired help with a close family friend. All questions were addressed. Patient was left in recliner with alarm activated and immediate needs within reach at end of session. Continue per current plan of care.      Pain  None/denied  Therapy/Group: Individual Therapy  [name] 07/19/2022, 3:50 PM

## 2022-07-19 NOTE — Progress Notes (Signed)
PROGRESS NOTE   Subjective/Complaints:  Pt reports hard ot get ot sleep, but also hard ot wake up with trazodone that she's taking= has a little hangover feeling.   Fell asleep at 9:30pm last night-   Still no BM- only 1 BM in last 7 days.   ROS:  Pt denies SOB, abd pain, CP, N/V/C/D, and vision changes    Objective:   DG HIP UNILAT WITH PELVIS 2-3 VIEWS RIGHT  Result Date: 07/17/2022 CLINICAL DATA:  Surgery follow-up EXAM: DG HIP (WITH OR WITHOUT PELVIS) 3V RIGHT COMPARISON:  Hip radiograph dated July 02, 2022 FINDINGS: Prior right proximal femur intramedullary nail placement or right intratrochanteric fracture. No evidence of new fracture. Mild degenerative changes of the bilateral hips. Soft tissues are unremarkable. IMPRESSION: Prior right proximal femur intramedullary nail placement. Electronically Signed   By: Allegra Lai M.D.   On: 07/17/2022 15:25    Recent Labs    07/17/22 0729  WBC 4.3  HGB 11.4*  HCT 36.8  PLT 446*    Recent Labs    07/17/22 0729  NA 142  K 3.6  CL 107  CO2 27  GLUCOSE 112*  BUN 10  CREATININE 0.68  CALCIUM 9.1     Intake/Output Summary (Last 24 hours) at 07/19/2022 0836 Last data filed at 07/18/2022 1850 Gross per 24 hour  Intake 900 ml  Output --  Net 900 ml        Physical Exam: Vital Signs Blood pressure 135/63, pulse 73, temperature 98.3 F (36.8 C), resp. rate 20, height 5' (1.524 m), weight 66.1 kg, SpO2 95 %.      General: awake, alert, appropriate, sleepy- supine in bed; NAD HENT: conjugate gaze; oropharynx moist CV: regular rate; no JVD Pulmonary: CTA B/L; no W/R/R- good air movement GI: soft, NT, ND, (+)BS Psychiatric: appropriate- interactive Neurological: Ox3- sleepy- but making sense Musculoskeletal:        General: swelling RLE especially proximally > LLE--stable    Cervical back: Normal range of motion.  Skin:    General: Skin is warm  and dry    Comments: Right hip incision  CDI. Surrounding bruising along thigh R>L- outer and inner thigh Neurological:     Mental Status: She is alert and oriented to person, place, and time.     No nystagmus appreciated    Cranial Nerves: CN 2-12 grossly I ntact    Sensory: No sensory deficit.     Coordination: Coordination normal.     Comments: Right hip limited by pain. Otherwise motor 5/5 UE, 3-4+/5 LLE, Right ADF/APF 4/5. No sensory findings. DTR's 1+     Assessment/Plan: 1. Functional deficits which require 3+ hours per day of interdisciplinary therapy in a comprehensive inpatient rehab setting. Physiatrist is providing close team supervision and 24 hour management of active medical problems listed below. Physiatrist and rehab team continue to assess barriers to discharge/monitor patient progress toward functional and medical goals  Care Tool:  Bathing    Body parts bathed by patient: Right arm, Left arm, Chest, Abdomen, Right upper leg, Face, Buttocks, Front perineal area, Left upper leg   Body parts bathed by helper: Right lower leg, Left  lower leg     Bathing assist Assist Level: Minimal Assistance - Patient > 75%     Upper Body Dressing/Undressing Upper body dressing   What is the patient wearing?: Pull over shirt, Bra    Upper body assist Assist Level: Set up assist    Lower Body Dressing/Undressing Lower body dressing      What is the patient wearing?: Pants, Incontinence brief     Lower body assist Assist for lower body dressing: Contact Guard/Touching assist     Toileting Toileting    Toileting assist Assist for toileting: Contact Guard/Touching assist     Transfers Chair/bed transfer  Transfers assist  Chair/bed transfer activity did not occur: Safety/medical concerns  Chair/bed transfer assist level: Supervision/Verbal cueing     Locomotion Ambulation   Ambulation assist   Ambulation activity did not occur: Safety/medical  concerns  Assist level: Supervision/Verbal cueing Assistive device: Walker-rolling Max distance: 185ft   Walk 10 feet activity   Assist  Walk 10 feet activity did not occur: Safety/medical concerns  Assist level: Supervision/Verbal cueing Assistive device: Walker-rolling   Walk 50 feet activity   Assist Walk 50 feet with 2 turns activity did not occur: Safety/medical concerns  Assist level: Supervision/Verbal cueing Assistive device: Walker-rolling    Walk 150 feet activity   Assist Walk 150 feet activity did not occur: Safety/medical concerns  Assist level: Supervision/Verbal cueing Assistive device: Walker-rolling    Walk 10 feet on uneven surface  activity   Assist Walk 10 feet on uneven surfaces activity did not occur: Safety/medical concerns         Wheelchair     Assist Is the patient using a wheelchair?: Yes Type of Wheelchair: Manual Wheelchair activity did not occur: Safety/medical concerns         Wheelchair 50 feet with 2 turns activity    Assist    Wheelchair 50 feet with 2 turns activity did not occur: Safety/medical concerns       Wheelchair 150 feet activity     Assist  Wheelchair 150 feet activity did not occur: Safety/medical concerns       Blood pressure 135/63, pulse 73, temperature 98.3 F (36.8 C), resp. rate 20, height 5' (1.524 m), weight 66.1 kg, SpO2 95 %. Medical Problem List and Plan: 1. Functional deficits secondary to a right intertrochanteric hip fx s/p ORIF 07/01/22             -patient may shower starting 8/16 if no drainage from incisions             -ELOS/Goals: 10-14 day, mod I with PT and OT  D/c 9/1  Con't CIR- PT and OT- making gains daily.  2.  Antithrombotics: -DVT/anticoagulation:  Pharmaceutical: Lovenox - LE dopplers negative--encourage elevation of legs when possible/ TEDS             -antiplatelet therapy: N/a 3. Pain Management: Hydrocodone prn.   8/28- pain doing better- taking  less pain meds 4. Mood/Behavior/Sleep: LCSW to follow for evaluation and support. 8/24- not sleeping- per #13   8/25- slept better- per #13  8/30- decreased Trazodone             -antipsychotic agents: N/A 5. Neuropsych/cognition: This patient is capable of making decisions on her own behalf. 6. Skin/Wound Care: Routine pressure relief measures.  7. Fluids/Electrolytes/Nutrition: Monitor I/O.  .   -emptying without issues currently  -stopped bladder scans  -encourage voiding out of bed when possible 8. Anxiety d/o: Managed with  celexa. Intermittent anxiety attacks  -team providing ego support on a daily basis  -have requested neuropsych visit  -would like to avoid benzos if possible, consider low dose xanax prn potentially  8/25- doing ok without benzo's- will con't OFF benzo's 9. ABLA:  .   -HGB down to 8.1 8/15--> 8.8  8/18  8/24- Hb 9.9- con't to monitor  8/28- Hb 11.4- doing better 10. Leucocytosis: wound clean, chest clear. Likely reactive             -Improved to 8.4 8/15  8/21- WBC 7.1k- doing well  8/28- WBC 4.3k 11.  Constipation: improved -continue senna-s.   8/23- no BM with sorbitol- ordered more this afternoon no BM still- will give SSE this evening if no large BM  8/24- LBM last evening large  8/26- Scheduled miralax  8/28- added sorbitol again and SSE  8/29- had BM yesterday AM, but no results with enema/sorbitol last evening so far- will monitor- if no BM by tomorrow, will get KUB 12. Confusion/sedation  8/23- checked U/A and Cx- U/A equivocal for UTI- will wait for U Cx.   8/24- d/w pt need to drink more to push fluids so can avid a UTI. Not drinking enough  8/26 Amoxicillin for 5 days started for Ecoli UTI started yesterday  8/28- cognitively, doing much better 13. Insomnia  8/24- will increase trazodone to 75 mg QHS for sleep and change to scheduled since pt not sleeping well  8/25- slept much better, however still sleepy at 6:30am- explained many people need  8 hours of sleep- she was fine with this- will go back to sleep for now  8/29- sleeping with hangover in Am- will reduce Trazodone to 50 mg QHS prn  8/30- decrease trazodone to 25 mg QHS  I spent a total of  35  minutes on total care today- >50% coordination of care- due to KUB and going over findings- and discussing sleep- also d/w nursing and PA about KUB and results.   Based on pt's decreased level of function, needs hospital bed for sitting up and down; as well as for transfers to w/c.   LOS: 16 days A FACE TO FACE EVALUATION WAS PERFORMED  Sima Lindenberger 07/19/2022, 8:36 AM

## 2022-07-20 ENCOUNTER — Encounter (HOSPITAL_COMMUNITY): Payer: Self-pay | Admitting: Physical Medicine and Rehabilitation

## 2022-07-20 DIAGNOSIS — K5900 Constipation, unspecified: Secondary | ICD-10-CM

## 2022-07-20 DIAGNOSIS — Z87898 Personal history of other specified conditions: Secondary | ICD-10-CM

## 2022-07-20 DIAGNOSIS — D62 Acute posthemorrhagic anemia: Secondary | ICD-10-CM

## 2022-07-20 MED ORDER — APIXABAN 2.5 MG PO TABS
2.5000 mg | ORAL_TABLET | Freq: Two times a day (BID) | ORAL | Status: DC
Start: 2022-07-20 — End: 2022-07-21
  Administered 2022-07-20 – 2022-07-21 (×3): 2.5 mg via ORAL
  Filled 2022-07-20 (×3): qty 1

## 2022-07-20 MED ORDER — SENNOSIDES-DOCUSATE SODIUM 8.6-50 MG PO TABS: 2.0000 | ORAL_TABLET | Freq: Every evening | ORAL | Status: DC | PRN

## 2022-07-20 MED ORDER — MELATONIN 5 MG PO TABS
5.0000 mg | ORAL_TABLET | Freq: Every day | ORAL | Status: DC
  Administered 2022-07-20: 5 mg via ORAL
  Filled 2022-07-20: qty 1

## 2022-07-20 NOTE — Progress Notes (Signed)
Physical Therapy Discharge Summary  Patient Details  Name: Mohogany Toppins MRN: [id number] Date of Birth: 10/30/1941  Date of Discharge from PT service:July 17, 2022  Today's Date: 07/20/2022 PT Individual Time: 0911-0947 PT Individual Time Calculation (min): 36 min    Patient has met 8 of 8 long term goals due to improved activity tolerance, improved balance, increased strength, increased range of motion, and improved coordination.  Patient to discharge at an ambulatory level Supervision.   Patient's care partner has participated in family education and is independent to provide the necessary physical and cognitive assistance at discharge.  Reasons goals not met: N/A all goals met  Recommendation:  Patient will benefit from ongoing skilled PT services in home health setting to continue to advance safe functional mobility, address ongoing impairments in strength, activity tolerance, balance, coordination, and minimize fall risk.  Equipment: BSC only pt has RW  Reasons for discharge: treatment goals met and discharge from hospital  Patient/family agrees with progress made and goals achieved: Yes  PT Discharge Precautions/Restrictions Precautions Precautions: Fall;Other (comment) Precaution Comments: watch BP and SpO2; vertigo Restrictions Weight Bearing Restrictions: Yes RLE Weight Bearing: Weight bearing as tolerated Vital Signs Therapy Vitals Temp: 98.6 F (37 C) Pulse Rate: 73 Resp: 17 BP: (!) 117/53 Patient Position (if appropriate): Sitting Oxygen Therapy SpO2: 100 % O2 Device: Room Air Pain Pain Assessment Pain Score: 5  Pain Type: Surgical pain;Acute pain Pain Location: Leg Pain Orientation: Right Pain Descriptors / Indicators: Throbbing Pain Interference Pain Interference Pain Effect on Sleep: 1. Rarely or not at all Pain Interference with Therapy Activities: 1. Rarely or not at all Pain Interference with Day-to-Day Activities: 1. Rarely or not at  all Vision/Perception  Vision - History Ability to See in Adequate Light: 0 Adequate Perception Perception: Within Functional Limits Praxis Praxis: Intact  Cognition Overall Cognitive Status: History of cognitive impairments - at baseline Arousal/Alertness: Awake/alert Orientation Level: Oriented X4 Attention: Sustained Sustained Attention Impairment: Verbal complex Memory: Impaired Sensation Sensation Light Touch: Appears Intact Hot/Cold: Not tested;Appears Intact Proprioception: Appears Intact Stereognosis: Appears Intact Coordination Gross Motor Movements are Fluid and Coordinated: Yes Fine Motor Movements are Fluid and Coordinated: Yes Motor  Motor Motor: Within Functional Limits Motor - Discharge Observations: improved  Mobility Bed Mobility Bed Mobility: Sit to Supine;Supine to Sit Supine to Sit: Supervision/Verbal cueing Sit to Supine: Supervision/Verbal cueing Transfers Transfers: Sit to Stand;Stand Pivot Transfers Sit to Stand: Supervision/Verbal cueing Stand Pivot Transfers: Supervision/Verbal cueing Transfer (Assistive device): Rolling walker Locomotion  Gait Ambulation: Yes Gait Assistance: Supervision/Verbal cueing Gait Distance (Feet): 200 Feet Assistive device: Rolling walker Gait Gait: Yes Gait Pattern: Impaired Gait Pattern: Step-through pattern;Decreased step length - right;Decreased stance time - right;Decreased weight shift to right Stairs / Additional Locomotion Stairs: Yes Stairs Assistance: Supervision/Verbal cueing Stair Management Technique: One rail Left;One rail Right Number of Stairs: 12 Ramp: Supervision/Verbal cueing Curb: Supervision/Verbal cueing Pick up small object from the floor assist level: Supervision/Verbal cueing Pick up small object from the floor assistive device: none Wheelchair Mobility Wheelchair Mobility: No  Trunk/Postural Assessment  Cervical Assessment Cervical Assessment: Within Functional Limits Thoracic  Assessment Thoracic Assessment: Within Functional Limits Lumbar Assessment Lumbar Assessment: Within Functional Limits Postural Control Postural Control: Within Functional Limits  Balance Balance Balance Assessed: Yes Static Sitting Balance Static Sitting - Balance Support: Feet supported Static Sitting - Level of Assistance: 5: Stand by assistance Dynamic Sitting Balance Dynamic Sitting - Balance Support: Feet supported Dynamic Sitting - Level of Assistance: 5: Stand by assistance  Static Standing Balance Static Standing - Balance Support: During functional activity Static Standing - Level of Assistance: 5: Stand by assistance Dynamic Standing Balance Dynamic Standing - Balance Support: During functional activity Dynamic Standing - Level of Assistance: 5: Stand by assistance Extremity Assessment    RLE Assessment RLE Assessment: Exceptions to WFL RLE Strength Right Hip Flexion: 4-/5 Right Hip ABduction: 4/5 Right Hip ADduction: 4/5 Right Knee Flexion: 4-/5 Right Knee Extension: 4/5 Right Ankle Dorsiflexion: 4/5 Right Ankle Plantar Flexion: 4/5 LLE Assessment LLE Assessment: Within Functional Limits General Strength Comments: WFL   Rosita DeChalus 07/20/2022, 3:34 PM 

## 2022-07-20 NOTE — Progress Notes (Signed)
Physical Therapy Session Note  Patient Details  Name: Amber Ruiz MRN: [id number] Date of Birth: May 07, 1941  Today's Date: 07/20/2022 PT Individual Time: 2376-2831 PT Individual Time Calculation (min): 36 min   Today's Date: 07/20/2022 PT Missed Time: 9 Minutes Missed Time Reason: Nursing care  Short Term Goals: Week 1:  PT Short Term Goal 1 (Week 1): Pt will ambulate >25 ft with LRAD PT Short Term Goal 1 - Progress (Week 1): Met PT Short Term Goal 2 (Week 1): Pt will initiate stair training PT Short Term Goal 2 - Progress (Week 1): Met PT Short Term Goal 3 (Week 1): Pt will perform bed mobility with mod A or better PT Short Term Goal 3 - Progress (Week 1): Met Week 2:  PT Short Term Goal 1 (Week 2): =LTGs d/t ELOS  Skilled Therapeutic Interventions/Progress Updates:      Therapy Documentation Precautions:  Precautions Precautions: Fall, Other (comment) Precaution Comments: watch BP and SpO2; vertigo Restrictions Weight Bearing Restrictions: Yes RLE Weight Bearing: Weight bearing as tolerated  Pt received semi-relined in bed with NT present. Pt missed 9 minutes of skilled PT due to peri-care by NT. Pt required (S) with rolling and supine to sit edge of bed. PT assessed pain interference, sensation, coordination, and strength. Pt (S) with sit to supine and ted hose donned dependently. Pt returned to sit edge of bed and required minimal assistance for lower body dressing for time management. Pt able to donn pants on in standing  with RW and required (S) for static and dynamic standing balance. Pt performed 1 x 10 of standing marches bilaterally and reported need to utilize restroom. Pt ambulated ~10 ft to restroom and continent of bladder and bowel. Pt reports burning sensation after urination. Pt ambulated to sink and (S) for dynamic balance with hand washing and ambulated ~5 ft to w/c. Pt left seated in w/c at bedside with chair alarm on and all needs within reach.    Therapy/Group: Individual Therapy  Verl Dicker Verl Dicker PT, DPT  07/20/2022, 7:43 AM

## 2022-07-20 NOTE — Plan of Care (Signed)
  Problem: RH Problem Solving Goal: LTG Patient will demonstrate problem solving for (SLP) Description: LTG:  Patient will demonstrate problem solving for basic/complex daily situations with cues  (SLP) Outcome: Completed/Met   Problem: RH Memory Goal: LTG Patient will use memory compensatory aids to (SLP) Description: LTG:  Patient will use memory compensatory aids to recall biographical/new, daily complex information with cues (SLP) Outcome: Completed/Met   

## 2022-07-20 NOTE — Discharge Summary (Signed)
Physician Discharge Summary  Patient ID: Shyanna Fukushima MRN: [id number] DOB/AGE: September 04, 1941 81 y.o.  Admit date: 07/03/2022 Discharge date: 07/21/2022  Discharge Diagnoses:  Principal Problem:   Femur fracture, right ([place]) Active Problems:   Anxiety reaction   Postoperative anemia due to acute blood loss   Constipation--acute on chronic   History of vertigo   Discharged Condition: good  Significant Diagnostic Studies: DG Lumbar Spine 2-3 Views  Result Date: 07/19/2022 CLINICAL DATA:  Provided history: Abnormal x-ray of lumbar spine EXAM: LUMBAR SPINE - 2-3 VIEW COMPARISON:  Abdominal radiographs 07/19/2022. Thoracic spine radiographs 08/26/2018 and 03/24/2012. Remote lumbar spine radiographs 03/27/2011. FINDINGS: There are 5 lumbar type vertebral bodies. The bones are demineralized. There is a mild convex left scoliosis. The lateral alignment is near anatomic. There is a chronic superior endplate compression deformity at L2 which appears unchanged from radiographs of 2012. No acute lumbar spine fractures are identified. There are mild facet degenerative changes inferiorly. Previous right hip ORIF noted. 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 No acute osseous findings. Electronically Signed   By: Richardean Sale M.D.   On: 07/19/2022 13:26   DG Abd 1 View  Result Date: 07/19/2022 CLINICAL DATA:  w 81 year old female presenting for evaluation of constipation and delayed colonic transit. EXAM: ABDOMEN - 1 VIEW COMPARISON:  Hip evaluation of July 17, 2022 FINDINGS: Lung bases not well evaluated. Bowel gas pattern without signs of obstruction. Moderate stool burden throughout the colon. Fecal material in the area of the rectum. No abnormal calcifications. Soft tissues otherwise without abnormality. Post ORIF of RIGHT hip fracture partially visualized. Mild levo convexity of the spine with degenerative changes. Query L2 compression fracture new since thoracic spine  imaging from October of 2019. Areas not well evaluated. IMPRESSION: Findings suggestive of constipation. Query L2 fracture new since previous imaging. Areas not well evaluated. Correlate with any new symptoms in this location. These results will be called to the ordering clinician or representative by the Radiologist Assistant, and communication documented in the PACS or [employer]. Electronically Signed   By: Zetta Bills M.D.   On: 07/19/2022 09:22   DG HIP UNILAT WITH PELVIS 2-3 VIEWS RIGHT  Result Date: 07/17/2022 CLINICAL DATA:  Surgery follow-up EXAM: DG HIP (WITH OR WITHOUT PELVIS) 3V RIGHT COMPARISON:  Hip radiograph dated July 02, 2022 FINDINGS: Prior right proximal femur intramedullary nail placement or right intratrochanteric fracture. No evidence of new fracture. Mild degenerative changes of the bilateral hips. Soft tissues are unremarkable. IMPRESSION: Prior right proximal femur intramedullary nail placement. Electronically Signed   By: Yetta Glassman M.D.   On: 07/17/2022 15:25   VAS [place] LOWER EXTREMITY VENOUS (DVT)  Result Date: 07/09/2022  Lower Venous DVT Study Patient Name:  ROBERTINE PHUNG  Date of Exam:   07/07/2022 Medical Rec #: [id number]        Accession #:    [id number] Date of Birth: 12-04-1940         Patient Gender: F Patient Age:   89 years Exam Location:  [facility] Procedure:      VAS [place] LOWER EXTREMITY VENOUS (DVT) Referring Phys: Alger Simons --------------------------------------------------------------------------------  Indications: Swelling - rehab patient. Other Indications: Right femur fracture s/p surgical repair (07/02/22). Comparison Study: No previous exams Performing Technologist: Jody Hill RVT, RDMS  Examination Guidelines: A complete evaluation includes B-mode imaging, spectral Doppler, color Doppler, and power Doppler as needed of all accessible portions of each vessel. Bilateral testing is considered an integral part of  a complete examination.  Limited examinations for reoccurring indications may be performed as noted. The reflux portion of the exam is performed with the patient in reverse Trendelenburg.  +---------+---------------+---------+-----------+----------+--------------+ RIGHT    CompressibilityPhasicitySpontaneityPropertiesThrombus Aging +---------+---------------+---------+-----------+----------+--------------+ CFV      Full           Yes      Yes                                 +---------+---------------+---------+-----------+----------+--------------+ SFJ      Full                                                        +---------+---------------+---------+-----------+----------+--------------+ FV Prox  Full           Yes      Yes                                 +---------+---------------+---------+-----------+----------+--------------+ FV Mid   Full           Yes      Yes                                 +---------+---------------+---------+-----------+----------+--------------+ FV DistalFull           Yes      Yes                                 +---------+---------------+---------+-----------+----------+--------------+ PFV      Full                                                        +---------+---------------+---------+-----------+----------+--------------+ POP      Full           Yes      Yes                                 +---------+---------------+---------+-----------+----------+--------------+ PTV      Full                                                        +---------+---------------+---------+-----------+----------+--------------+ PERO     Full                                                        +---------+---------------+---------+-----------+----------+--------------+   +---------+---------------+---------+-----------+----------+--------------+ LEFT     CompressibilityPhasicitySpontaneityPropertiesThrombus Aging  +---------+---------------+---------+-----------+----------+--------------+ CFV      Full           Yes      Yes                                 +---------+---------------+---------+-----------+----------+--------------+  SFJ      Full                                                        +---------+---------------+---------+-----------+----------+--------------+ FV Prox  Full           Yes      Yes                                 +---------+---------------+---------+-----------+----------+--------------+ FV Mid   Full           Yes      Yes                                 +---------+---------------+---------+-----------+----------+--------------+ FV DistalFull           Yes      Yes                                 +---------+---------------+---------+-----------+----------+--------------+ PFV      Full                                                        +---------+---------------+---------+-----------+----------+--------------+ POP      Full           Yes      Yes                                 +---------+---------------+---------+-----------+----------+--------------+ PTV      Full                                                        +---------+---------------+---------+-----------+----------+--------------+ PERO     Full                                                        +---------+---------------+---------+-----------+----------+--------------+     Summary: BILATERAL: - No evidence of deep vein thrombosis seen in the lower extremities, bilaterally. -No evidence of popliteal cyst, bilaterally.   *See table(s) above for measurements and observations. Electronically signed by Heath Lark on 07/09/2022 at 2:52:32 PM.    Final    ECHOCARDIOGRAM COMPLETE  Result Date: 07/02/2022    ECHOCARDIOGRAM REPORT   Patient Name:   [facility] Date of Exam: 07/02/2022 Medical Rec #:  [id number]       Height:       60.0 in Accession #:    [id number]       Weight:       135.0 lb Date of Birth:  1941-03-19        BSA:  1.579 m Patient Age:    63 years        BP:           108/65 mmHg Patient Gender: F               HR:           75 bpm. Exam Location:  Inpatient Procedure: 2D Echo Indications:    syncope  History:        Patient has no prior history of Echocardiogram examinations.  Sonographer:    Donnellson Referring Phys: [id number] [place]  1. Left ventricular ejection fraction, by estimation, is 60 to 65%. The left ventricle has normal function. The left ventricle has no regional wall motion abnormalities. Left ventricular diastolic parameters are consistent with Grade I diastolic dysfunction (impaired relaxation).  2. Right ventricular systolic function is normal. The right ventricular size is normal. There is normal pulmonary artery systolic pressure.  3. The mitral valve is normal in structure. No evidence of mitral valve regurgitation. No evidence of mitral stenosis.  4. Tricuspid valve regurgitation is moderate, most prominent in Img series 45. Unable to visualize well in over view; unable to view hepatin vein or IVC.  5. The aortic valve is tricuspid. Aortic valve regurgitation is not visualized. No aortic stenosis is present. Comparison(s): No prior Echocardiogram. FINDINGS  Left Ventricle: Left ventricular ejection fraction, by estimation, is 60 to 65%. The left ventricle has normal function. The left ventricle has no regional wall motion abnormalities. The left ventricular internal cavity size was normal in size. There is  no left ventricular hypertrophy. Left ventricular diastolic parameters are consistent with Grade I diastolic dysfunction (impaired relaxation). Right Ventricle: The right ventricular size is normal. No increase in right ventricular wall thickness. Right ventricular systolic function is normal. There is normal pulmonary artery systolic pressure. The tricuspid regurgitant velocity is 2.74 m/s, and  with  an assumed right atrial pressure of 3 mmHg, the estimated right ventricular systolic pressure is 123456 mmHg. Left Atrium: Left atrial size was normal in size. Right Atrium: Right atrial size was normal in size. Pericardium: There is no evidence of pericardial effusion. Mitral Valve: The mitral valve is normal in structure. No evidence of mitral valve regurgitation. No evidence of mitral valve stenosis. Tricuspid Valve: The tricuspid valve is normal in structure. Tricuspid valve regurgitation is moderate . No evidence of tricuspid stenosis. Aortic Valve: The aortic valve is tricuspid. Aortic valve regurgitation is not visualized. No aortic stenosis is present. Pulmonic Valve: The pulmonic valve was normal in structure. Pulmonic valve regurgitation is trivial. No evidence of pulmonic stenosis. Aorta: The aortic root, ascending aorta and aortic arch are all structurally normal, with no evidence of dilitation or obstruction. IAS/Shunts: No atrial level shunt detected by color flow Doppler.  LEFT VENTRICLE PLAX 2D LVIDd:         3.80 cm   Diastology LVIDs:         2.20 cm   LV e' medial:    5.98 cm/s LV PW:         0.80 cm   LV E/e' medial:  19.1 LV IVS:        0.70 cm   LV e' lateral:   5.55 cm/s LVOT diam:     1.60 cm   LV E/e' lateral: 20.5 LV SV:         45 LV SV Index:   28 LVOT Area:     2.01 cm  RIGHT VENTRICLE RV S prime:     16.30 cm/s TAPSE (M-mode): 2.2 cm LEFT ATRIUM             Index        RIGHT ATRIUM           Index LA diam:        2.60 cm 1.65 cm/m   RA Area:     10.60 cm LA Vol (A2C):   28.3 ml 17.92 ml/m  RA Volume:   22.10 ml  13.99 ml/m LA Vol (A4C):   42.1 ml 26.65 ml/m LA Biplane Vol: 35.9 ml 22.73 ml/m  AORTIC VALVE LVOT Vmax:   107.00 cm/s LVOT Vmean:  73.700 cm/s LVOT VTI:    0.223 m  AORTA Ao Root diam: 2.80 cm Ao Asc diam:  2.60 cm MITRAL VALVE                TRICUSPID VALVE MV Area (PHT): 4.06 cm     TR Peak grad:   30.0 mmHg MV Decel Time: 187 msec     TR Vmax:        274.00 cm/s MV E  velocity: 114.00 cm/s MV A velocity: 150.00 cm/s  SHUNTS MV E/A ratio:  0.76         Systemic VTI:  0.22 m                             Systemic Diam: 1.60 cm Rudean Haskell MD Electronically signed by Rudean Haskell MD Signature Date/Time: 07/02/2022/2:58:01 PM    Final    DG FEMUR, MIN 2 VIEWS RIGHT  Result Date: 07/02/2022 CLINICAL DATA:  [id number]; ORIF right femoral neck EXAM: Three views, 6 images of the right femur and right hip were obtained intraoperatively without presence of radiologist. Fluoro time: 53 seconds.  Fluoro dose: 6.28 mGy COMPARISON:  July 01, 2022 FINDINGS: There are ORIF changes seen with intramedullary rod and compression screw devices transfixing the right intertrochanteric fracture of the femur. The alignment is near anatomical. Please see intraoperative findings for further detail. IMPRESSION: ORIF changes with intramedullary rod and compressive screw devices transfix the intertrochanteric fracture of the right femur. The alignment is near anatomical. Electronically Signed   By: Frazier Richards M.D.   On: 07/02/2022 10:10   DG HIP PORT UNILAT W OR W/O PELVIS 1V RIGHT  Result Date: 07/02/2022 CLINICAL DATA:  [id number]; [id number]; ORIF right femoral neck EXAM: Three views, 6 images of the right femur and right hip were obtained intraoperatively without presence of radiologist. Fluoro time: 53 seconds.  Fluoro dose: 6.28 mGy COMPARISON:  July 01, 2022 FINDINGS: There are ORIF changes seen with intramedullary rod and compression screw devices transfixing the right intertrochanteric fracture of the femur. The alignment is near anatomical. Please see intraoperative findings for further detail. IMPRESSION: ORIF changes with intramedullary rod and compressive screw devices transfix the intertrochanteric fracture of the right femur. The alignment is near anatomical. Electronically Signed   By: Frazier Richards M.D.   On: 07/02/2022 10:10   CT Knee Right Wo Contrast  Result  Date: 07/01/2022 CLINICAL DATA:  Possible undisplaced fracture in the distal femur on recent plain film examination EXAM: CT OF THE RIGHT KNEE WITHOUT CONTRAST TECHNIQUE: Multidetector CT imaging of the right knee was performed according to the standard protocol. Multiplanar CT image reconstructions were also generated. RADIATION DOSE REDUCTION: This exam was performed according to the departmental dose-optimization program  which includes automated exposure control, adjustment of the mA and/or kV according to patient size and/or use of iterative reconstruction technique. COMPARISON:  Plain film from earlier in the same day. FINDINGS: Bones/Joint/Cartilage Distal femur shows no evidence of acute fracture. The lucencies are felt to be related to variation in the mineralization laterally. The proximal tibia and fibula are well visualized without evidence of acute fracture. Subchondral sclerosis is noted in the medial tibial plateau consistent with mild degenerative change. Some osteophytic changes are noted as well. Patella appears within normal limits. Ligaments Suboptimally assessed by CT. Muscles and Tendons Surrounding musculature appears within normal limits. Soft tissues No significant soft tissue edema is noted. No sizable joint effusion is seen. IMPRESSION: No evidence of acute fracture. Lucency seen previously are related to the degree of mineralization in the distal femur laterally. Mild degenerative changes of the medial knee joint are seen. Electronically Signed   By: Inez Catalina M.D.   On: 07/01/2022 19:26   CT HEAD WO CONTRAST  Result Date: 07/01/2022 CLINICAL DATA:  81 year old female with headache and neck pain following fall. EXAM: CT HEAD WITHOUT CONTRAST CT CERVICAL SPINE WITHOUT CONTRAST TECHNIQUE: Multidetector CT imaging of the head and cervical spine was performed following the standard protocol without intravenous contrast. Multiplanar CT image reconstructions of the cervical spine were  also generated. RADIATION DOSE REDUCTION: This exam was performed according to the departmental dose-optimization program which includes automated exposure control, adjustment of the mA and/or kV according to patient size and/or use of iterative reconstruction technique. COMPARISON:  05/07/2022 CTA head, 07/24/2019 brain MR, 03/24/2012 CT cervical spine and prior studies FINDINGS: CT HEAD FINDINGS Brain: No evidence of acute infarction, hemorrhage, hydrocephalus, extra-axial collection or mass lesion/mass effect. Atrophy and chronic small-vessel white matter ischemic changes again noted. Vascular: Carotid and vertebral atherosclerotic calcifications are noted. Skull: Normal. Negative for fracture or focal lesion. Sinuses/Orbits: No acute finding. Other: RIGHT posterior scalp soft tissue swelling is noted. CT CERVICAL SPINE FINDINGS Alignment: Normal. Skull base and vertebrae: No acute fracture. 30% compression of C7 is unchanged from 2013. No primary bone lesion or focal pathologic process. Soft tissues and spinal canal: No prevertebral fluid or swelling. No visible canal hematoma. 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 Upper chest: No acute abnormality. Other: None IMPRESSION: 1. No evidence of acute intracranial abnormality. Atrophy and chronic small-vessel white matter ischemic changes. 2. RIGHT posterior scalp soft tissue swelling without fracture. 3. No static evidence of acute injury to the cervical spine. Electronically Signed   By: Margarette Canada M.D.   On: 07/01/2022 16:19   CT CERVICAL SPINE WO CONTRAST  Result Date: 07/01/2022 CLINICAL DATA:  81 year old female with headache and neck pain following fall. EXAM: CT HEAD WITHOUT CONTRAST CT CERVICAL SPINE WITHOUT CONTRAST TECHNIQUE: Multidetector CT imaging of the head and cervical spine was performed following the standard protocol without intravenous contrast. Multiplanar CT image reconstructions of the cervical spine  were also generated. RADIATION DOSE REDUCTION: This exam was performed according to the departmental dose-optimization program which includes automated exposure control, adjustment of the mA and/or kV according to patient size and/or use of iterative reconstruction technique. COMPARISON:  05/07/2022 CTA head, 07/24/2019 brain MR, 03/24/2012 CT cervical spine and prior studies FINDINGS: CT HEAD FINDINGS Brain: No evidence of acute infarction, hemorrhage, hydrocephalus, extra-axial collection or mass lesion/mass effect. Atrophy and chronic small-vessel white matter ischemic changes again noted. Vascular: Carotid and vertebral atherosclerotic calcifications are noted. Skull: Normal. Negative for fracture or  focal lesion. Sinuses/Orbits: No acute finding. Other: RIGHT posterior scalp soft tissue swelling is noted. CT CERVICAL SPINE FINDINGS Alignment: Normal. Skull base and vertebrae: No acute fracture. 30% compression of C7 is unchanged from 2013. No primary bone lesion or focal pathologic process. Soft tissues and spinal canal: No prevertebral fluid or swelling. No visible canal hematoma. 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 Upper chest: No acute abnormality. Other: None IMPRESSION: 1. No evidence of acute intracranial abnormality. Atrophy and chronic small-vessel white matter ischemic changes. 2. RIGHT posterior scalp soft tissue swelling without fracture. 3. No static evidence of acute injury to the cervical spine. Electronically Signed   By: Margarette Canada M.D.   On: 07/01/2022 16:19   DG Knee 2 Views Right  Result Date: 07/01/2022 CLINICAL DATA:  Trauma, fall EXAM: RIGHT KNEE - 1-2 VIEW COMPARISON:  None Available. FINDINGS: No displaced fracture is seen. There is no significant effusion in suprapatellar bursa. In the AP view, there is faint vertically oriented lucency in the lateral aspect of distal femur which could not be distinctly seen in the lateral view. IMPRESSION: No  displaced fracture is seen in right knee. There is no significant effusion. There are faint linear lucencies in the lateral aspect of distal femur seen only in the AP projection. This may be an artifact or suggest undisplaced fracture. Please correlate with clinical physical examination findings and consider four view examination of right knee if clinically warranted. Electronically Signed   By: Elmer Picker M.D.   On: 07/01/2022 15:45   DG Hip Unilat With Pelvis 2-3 Views Right  Result Date: 07/01/2022 CLINICAL DATA:  Trauma, fall EXAM: DG HIP (WITH OR WITHOUT PELVIS) 2-3V RIGHT COMPARISON:  None Available. FINDINGS: Comminuted intertrochanteric fracture is seen in proximal right femur. There is overriding of fracture fragments. There is medial displacement of lesser trochanter. There is no dislocation. IMPRESSION: Comminuted, displaced intertrochanteric fracture is seen in proximal right femur. Electronically Signed   By: Elmer Picker M.D.   On: 07/01/2022 15:41   DG FEMUR, MIN 2 VIEWS RIGHT  Result Date: 07/01/2022 CLINICAL DATA:  Trauma, fall EXAM: RIGHT FEMUR 2 VIEWS COMPARISON:  None Available. FINDINGS: Comminuted intertrochanteric fracture is seen proximal right femur. There is superior displacement of distal major fracture fragment. There is no dislocation. IMPRESSION: Comminuted, displaced intertrochanteric fracture is seen in proximal right femur. Electronically Signed   By: Elmer Picker M.D.   On: 07/01/2022 15:40   DG Pelvis 1-2 Views  Result Date: 07/01/2022 CLINICAL DATA:  Pain after fall EXAM: PELVIS - 1-2 VIEW COMPARISON:  None Available. FINDINGS: There is a comminuted angulated/displaced right intertrochanteric femoral fracture. IMPRESSION: Comminuted angulated/displaced right intertrochanteric hip fracture. Electronically Signed   By: Dorise Bullion III M.D.   On: 07/01/2022 14:10    Labs:  Basic Metabolic Panel:    Latest Ref Rng & Units 07/17/2022    7:29  AM 07/10/2022    7:14 AM 07/04/2022    5:41 AM  BMP  Glucose 70 - 99 mg/dL 112  149  108   BUN 8 - 23 mg/dL 10  17  16    Creatinine 0.44 - 1.00 mg/dL 0.68  0.68  0.67   Sodium 135 - 145 mmol/L 142  138  140   Potassium 3.5 - 5.1 mmol/L 3.6  4.1  3.9   Chloride 98 - 111 mmol/L 107  106  109   CO2 22 - 32 mmol/L 27  26  24  Calcium 8.9 - 10.3 mg/dL 9.1  8.7  8.1      CBC:    Latest Ref Rng & Units 07/17/2022    7:29 AM 07/10/2022    7:14 AM 07/07/2022    6:15 AM  CBC  WBC 4.0 - 10.5 K/uL 4.3  7.1  6.7   Hemoglobin 12.0 - 15.0 g/dL 37.6  9.9  8.8   Hematocrit 36.0 - 46.0 % 36.8  32.2  27.5   Platelets 150 - 400 K/uL 446  382  268      CBG: No results for input(s): "GLUCAP" in the last 168 hours.  Brief HPI:   Emmaleah Meroney is a 81 y.o. female ***   Hospital Course: Arrow Emmerich was admitted to rehab 07/03/2022 for inpatient therapies to consist of PT, ST and OT at least three hours five days a week. Past admission physiatrist, therapy team and rehab RN have worked together to provide customized collaborative inpatient rehab.   Blood pressures were monitored on TID basis and   Diabetes has been monitored with ac/hs CBG checks and SSI was use prn for tighter BS control.    Rehab course: During patient's stay in rehab weekly team conferences were held to monitor patient's progress, set goals and discuss barriers to discharge. At admission, patient required  She  has had improvement in activity tolerance, balance, postural control as well as ability to compensate for deficits.       Discharge disposition: 01-Home or Self Care  Diet: Regular   Special Instructions:   Allergies as of 07/21/2022       Reactions   Polyoxyethylene 40 Sorbitol Septaoleate [sorbitan]    Tremors/anxiety        Medication List     STOP taking these medications    bisacodyl 5 MG EC tablet Commonly known as: DULCOLAX   HYDROcodone-acetaminophen 5-325 MG tablet Commonly known as:  NORCO/VICODIN       TAKE these medications    acetaminophen 325 MG tablet Commonly known as: TYLENOL Take 1-2 tablets (325-650 mg total) by mouth every 4 (four) hours as needed for mild pain. What changed:  medication strength how much to take when to take this   alendronate 70 MG tablet Commonly known as: FOSAMAX Take 70 mg by mouth once a week.   apixaban 2.5 MG Tabs tablet Commonly known as: ELIQUIS Take 1 tablet (2.5 mg total) by mouth 2 (two) times daily.   cetirizine 5 MG tablet Commonly known as: ZYRTEC Take 5 mg by mouth daily.   citalopram 40 MG tablet Commonly known as: CELEXA Take 20 mg by mouth daily.   FeroSul 325 (65 FE) MG tablet Generic drug: ferrous sulfate Take 1 tablet (325 mg total) by mouth 2 (two) times daily with a meal. What changed: when to take this   melatonin 5 MG Tabs Take 1 tablet (5 mg total) by mouth at bedtime.   pantoprazole 20 MG tablet Commonly known as: PROTONIX Take 20 mg by mouth daily.   polyethylene glycol 17 g packet Commonly known as: MIRALAX / GLYCOLAX Take 17 g by mouth daily as needed for mild constipation.   senna-docusate 8.6-50 MG tablet Commonly known as: Senokot-S Take 2 tablets by mouth at bedtime as needed for mild constipation.   Vitamin D (Ergocalciferol) 1.25 MG (50000 UNIT) Caps capsule Commonly known as: DRISDOL Take 1 capsule (50,000 Units total) by mouth every 7 (seven) days.        Follow-up Information  Charleston Poot, MD. Call.   Specialty: Internal Medicine Why: Monday or Tueday for post hospital follow up. Contact information: [address] [place] [address] [phone]         Courtney Heys, MD. Call.   Specialty: Physical Medicine and Rehabilitation Why: As needed Contact information: [id number] [place] [address] [phone]         Shona Needles, MD. Schedule an appointment as soon as possible for a visit in 2 week(s).    Specialty: Orthopedic Surgery Why: for wound check and repeat x-rays Contact information: [place] [address] [phone]                 Signed: Bary Leriche 07/21/2022, 4:12 PM

## 2022-07-20 NOTE — Progress Notes (Signed)
PROGRESS NOTE   Subjective/Complaints:  Pt reports still hard to get to sleep, but so sedated in AM- will stop Trazodone and start Melatonin 5 mg QHS for sleep. Has taken in past. Also feels a little "drunk" with trazodone.  Said had bowel "explosion" last night after bowel meds- says feels cleaned out since had "massive BM".   ROS:  Pt denies SOB, abd pain, CP, N/V/C/D, and vision changes     Objective:   DG Lumbar Spine 2-3 Views  Result Date: 07/19/2022 CLINICAL DATA:  Provided history: Abnormal x-ray of lumbar spine EXAM: LUMBAR SPINE - 2-3 VIEW COMPARISON:  Abdominal radiographs 07/19/2022. Thoracic spine radiographs 08/26/2018 and 03/24/2012. Remote lumbar spine radiographs 03/27/2011. FINDINGS: There are 5 lumbar type vertebral bodies. The bones are demineralized. There is a mild convex left scoliosis. The lateral alignment is near anatomic. There is a chronic superior endplate compression deformity at L2 which appears unchanged from radiographs of 2012. No acute lumbar spine fractures are identified. There are mild facet degenerative changes inferiorly. Previous right hip ORIF noted. 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 No acute osseous findings. Electronically Signed   By: Carey Bullocks M.D.   On: 07/19/2022 13:26   DG Abd 1 View  Result Date: 07/19/2022 CLINICAL DATA:  w 81 year old female presenting for evaluation of constipation and delayed colonic transit. EXAM: ABDOMEN - 1 VIEW COMPARISON:  Hip evaluation of July 17, 2022 FINDINGS: Lung bases not well evaluated. Bowel gas pattern without signs of obstruction. Moderate stool burden throughout the colon. Fecal material in the area of the rectum. No abnormal calcifications. Soft tissues otherwise without abnormality. Post ORIF of RIGHT hip fracture partially visualized. Mild levo convexity of the spine with degenerative changes. Query L2  compression fracture new since thoracic spine imaging from October of 2019. Areas not well evaluated. IMPRESSION: Findings suggestive of constipation. Query L2 fracture new since previous imaging. Areas not well evaluated. Correlate with any new symptoms in this location. These results will be called to the ordering clinician or representative by the Radiologist Assistant, and communication documented in the PACS or [employer]. Electronically Signed   By: Donzetta Kohut M.D.   On: 07/19/2022 09:22    No results for input(s): "WBC", "HGB", "HCT", "PLT" in the last 72 hours.   No results for input(s): "NA", "K", "CL", "CO2", "GLUCOSE", "BUN", "CREATININE", "CALCIUM" in the last 72 hours.    Intake/Output Summary (Last 24 hours) at 07/20/2022 0835 Last data filed at 07/20/2022 0834 Gross per 24 hour  Intake 835 ml  Output --  Net 835 ml        Physical Exam: Vital Signs Blood pressure 134/66, pulse 81, temperature 98.5 F (36.9 C), resp. rate 18, height 5' (1.524 m), weight 66.1 kg, SpO2 96 %.       General: awake, alert, appropriate, supine in bed; has stuffed monkey on neck; NAD HENT: conjugate gaze; oropharynx moist CV: regular rate; no JVD Pulmonary: CTA B/L; no W/R/R- good air movement GI: soft, NT, ND, (+)BS- not distended anymore- much softer Psychiatric: appropriate Neurological: Ox3- sleepy, but making sense- very verbose today Musculoskeletal:  General: swelling RLE especially proximally > LLE--stable    Cervical back: Normal range of motion.  Skin:    General: Skin is warm and dry    Comments: Right hip incision  CDI. Surrounding bruising along thigh R>L- outer and inner thigh Neurological:     Mental Status: She is alert and oriented to person, place, and time.     No nystagmus appreciated    Cranial Nerves: CN 2-12 grossly I ntact    Sensory: No sensory deficit.     Coordination: Coordination normal.     Comments: Right hip limited by pain.  Otherwise motor 5/5 UE, 3-4+/5 LLE, Right ADF/APF 4/5. No sensory findings. DTR's 1+     Assessment/Plan: 1. Functional deficits which require 3+ hours per day of interdisciplinary therapy in a comprehensive inpatient rehab setting. Physiatrist is providing close team supervision and 24 hour management of active medical problems listed below. Physiatrist and rehab team continue to assess barriers to discharge/monitor patient progress toward functional and medical goals  Care Tool:  Bathing    Body parts bathed by patient: Right arm, Left arm, Chest, Abdomen, Right upper leg, Face, Buttocks, Front perineal area, Left upper leg   Body parts bathed by helper: Right lower leg, Left lower leg     Bathing assist Assist Level: Minimal Assistance - Patient > 75%     Upper Body Dressing/Undressing Upper body dressing   What is the patient wearing?: Pull over shirt, Bra    Upper body assist Assist Level: Set up assist    Lower Body Dressing/Undressing Lower body dressing      What is the patient wearing?: Pants, Incontinence brief     Lower body assist Assist for lower body dressing: Contact Guard/Touching assist     Toileting Toileting    Toileting assist Assist for toileting: Supervision/Verbal cueing     Transfers Chair/bed transfer  Transfers assist  Chair/bed transfer activity did not occur: Safety/medical concerns  Chair/bed transfer assist level: Supervision/Verbal cueing     Locomotion Ambulation   Ambulation assist   Ambulation activity did not occur: Safety/medical concerns  Assist level: Supervision/Verbal cueing Assistive device: Walker-rolling Max distance: 285ft   Walk 10 feet activity   Assist  Walk 10 feet activity did not occur: Safety/medical concerns  Assist level: Supervision/Verbal cueing Assistive device: Walker-rolling   Walk 50 feet activity   Assist Walk 50 feet with 2 turns activity did not occur: Safety/medical  concerns  Assist level: Supervision/Verbal cueing Assistive device: Walker-rolling    Walk 150 feet activity   Assist Walk 150 feet activity did not occur: Safety/medical concerns  Assist level: Supervision/Verbal cueing Assistive device: Walker-rolling    Walk 10 feet on uneven surface  activity   Assist Walk 10 feet on uneven surfaces activity did not occur: Safety/medical concerns   Assist level: Supervision/Verbal cueing Assistive device: Development worker, international aid     Assist Is the patient using a wheelchair?: No Type of Wheelchair: Manual Wheelchair activity did not occur: Safety/medical concerns         Wheelchair 50 feet with 2 turns activity    Assist    Wheelchair 50 feet with 2 turns activity did not occur: Safety/medical concerns       Wheelchair 150 feet activity     Assist  Wheelchair 150 feet activity did not occur: Safety/medical concerns       Blood pressure 134/66, pulse 81, temperature 98.5 F (36.9 C), resp. rate 18, height 5' (1.524 m), weight  66.1 kg, SpO2 96 %. Medical Problem List and Plan: 1. Functional deficits secondary to a right intertrochanteric hip fx s/p ORIF 07/01/22             -patient may shower starting 8/16 if no drainage from incisions             -ELOS/Goals: 10-14 day, mod I with PT and OT  D/c 9/1  D/c tomorrow- con't CIR- PT and OT - won't need f/u with me- only Ortho 2.  Antithrombotics: -DVT/anticoagulation:  Pharmaceutical: Lovenox - LE dopplers negative--encourage elevation of legs when possible/ TEDS 8/31- will change to Eliquis 2.5 mg BID x 30 days per Ortho             -antiplatelet therapy: N/a 3. Pain Management: Hydrocodone prn.   8/28- pain doing better- taking less pain meds  8/31- advised pt will get 7 days of meds- needs to f/u with Ortho for more pain meds- suggested taking less to improve constipation issues.  4. Mood/Behavior/Sleep: LCSW to follow for evaluation and support. 8/24-  not sleeping- per #13   8/31- d/c trazodone and started melatonin 5 mg QHS             -antipsychotic agents: N/A 5. Neuropsych/cognition: This patient is capable of making decisions on her own behalf. 6. Skin/Wound Care: Routine pressure relief measures.  7. Fluids/Electrolytes/Nutrition: Monitor I/O.  .   -emptying without issues currently  -stopped bladder scans  -encourage voiding out of bed when possible 8. Anxiety d/o: Managed with celexa. Intermittent anxiety attacks  -team providing ego support on a daily basis  -have requested neuropsych visit  -would like to avoid benzos if possible, consider low dose xanax prn potentially  8/25- doing ok without benzo's- will con't OFF benzo's 9. ABLA:  .   -HGB down to 8.1 8/15--> 8.8  8/18  8/24- Hb 9.9- con't to monitor  8/28- Hb 11.4- doing better 10. Leucocytosis: wound clean, chest clear. Likely reactive             -Improved to 8.4 8/15  8/21- WBC 7.1k- doing well  8/28- WBC 4.3k 11.  Constipation: improved -continue senna-s.   8/23- no BM with sorbitol- ordered more this afternoon no BM still- will give SSE this evening if no large BM  8/24- LBM last evening large  8/26- Scheduled miralax  8/28- added sorbitol again and SSE  8/29- had BM yesterday AM, but no results with enema/sorbitol last evening so far- will monitor- if no BM by tomorrow, will get KUB  8/31- got KUB- full of stool- got cleaned out last night- feeling better 12. Confusion/sedation  8/23- checked U/A and Cx- U/A equivocal for UTI- will wait for U Cx.   8/24- d/w pt need to drink more to push fluids so can avid a UTI. Not drinking enough  8/26 Amoxicillin for 5 days started for Ecoli UTI started yesterday  8/28- cognitively, doing much better 13. Insomnia  8/24- will increase trazodone to 75 mg QHS for sleep and change to scheduled since pt not sleeping well  8/25- slept much better, however still sleepy at 6:30am- explained many people need 8 hours of sleep-  she was fine with this- will go back to sleep for now  8/29- sleeping with hangover in Am- will reduce Trazodone to 50 mg QHS prn  8/30- decrease trazodone to 25 mg QHS  8/31- changed to Melatonin- stopped trazodone since so sleepy in AM 14. L2 chronic fx-  8/31- found on KUB- got dedicated Lumbar xray- is chronic and been stable since 2013 xray.   I spent a total of 52   minutes on total care today- >50% coordination of care- due to 40 minutes in room with pt- she wanted to disucss sleep; problems with bowels last night and Eliquis per Ortho   Based on pt's decreased level of function, needs hospital bed for sitting -up and down; as well as for transfers to w/c.   LOS: 17 days A FACE TO FACE EVALUATION WAS PERFORMED  Amber Ruiz 07/20/2022, 8:35 AM

## 2022-07-20 NOTE — Progress Notes (Signed)
Physical Therapy Session Note  Patient Details  Name: Amber Ruiz MRN: [id number] Date of Birth: 08/11/1941  Today's Date: 07/20/2022 PT Individual Time: 1521-1606 PT Individual Time Calculation (min): 45 min   Short Term Goals: Week 2:  PT Short Term Goal 1 (Week 2): =LTGs d/t ELOS  Skilled Therapeutic Interventions/Progress Updates: Pt presented in recliner agreeable to therapy with Lattie Haw, RT present. Lattie Haw leaving shortly after PTA's arrival. Pt stated increased soreness in RLE this day which pt contributed to increased activity yesterday but did subside with ambulation throughout session. Pt participated in functional activities in preparation for d/c. Pt was supervision overall and ambulated to rehab gym with RW and intermittent standing rest breaks more due to pt's hyperverbal nature vs pain or fatigue. Pt participated in use of rebounder using BUE 2 x 10 with supervision and demonstrated good reaching without LOB. Pt then ambulated back to room in same manner as prior and returned to recliner at end of session. Pt left with seat alarm on, call bell within reach and needs met.      Therapy Documentation Precautions:  Precautions Precautions: Fall, Other (comment) Precaution Comments: watch BP and SpO2; vertigo Restrictions Weight Bearing Restrictions: Yes RLE Weight Bearing: Weight bearing as tolerated General: Vital Signs:  Pain: Pain Assessment Pain Score: 5  Pain Type: Surgical pain;Acute pain Pain Location: Leg Pain Orientation: Right Pain Descriptors / Indicators: Throbbing Mobility: Bed Mobility Bed Mobility: Sit to Supine;Supine to Sit Supine to Sit: Supervision/Verbal cueing Sit to Supine: Supervision/Verbal cueing Transfers Transfers: Sit to Stand;Stand Pivot Transfers Sit to Stand: Supervision/Verbal cueing Stand Pivot Transfers: Supervision/Verbal cueing Transfer (Assistive device): Rolling walker Locomotion : Gait Ambulation: Yes Gait Assistance:  Supervision/Verbal cueing Gait Distance (Feet): 200 Feet Assistive device: Rolling walker Gait Gait: Yes Gait Pattern: Impaired Gait Pattern: Step-through pattern;Decreased step length - right;Decreased stance time - right;Decreased weight shift to right Stairs / Additional Locomotion Stairs: Yes Stairs Assistance: Supervision/Verbal cueing Stair Management Technique: One rail Left;One rail Right Number of Stairs: 12 Ramp: Supervision/Verbal cueing Curb: Supervision/Verbal cueing Wheelchair Mobility Wheelchair Mobility: No  Trunk/Postural Assessment : Cervical Assessment Cervical Assessment: Within Functional Limits Thoracic Assessment Thoracic Assessment: Within Functional Limits Lumbar Assessment Lumbar Assessment: Within Functional Limits Postural Control Postural Control: Within Functional Limits  Balance: Balance Balance Assessed: Yes Static Sitting Balance Static Sitting - Balance Support: Feet supported Static Sitting - Level of Assistance: 5: Stand by assistance Dynamic Sitting Balance Dynamic Sitting - Balance Support: Feet supported Dynamic Sitting - Level of Assistance: 5: Stand by assistance Static Standing Balance Static Standing - Balance Support: During functional activity Static Standing - Level of Assistance: 5: Stand by assistance Dynamic Standing Balance Dynamic Standing - Balance Support: During functional activity Dynamic Standing - Level of Assistance: 5: Stand by assistance Exercises:   Other Treatments:      Therapy/Group: Individual Therapy  Amber Ruiz 07/20/2022, 4:28 PM

## 2022-07-20 NOTE — Progress Notes (Addendum)
Inpatient Rehabilitation Discharge Medication Review by a Pharmacist  A complete drug regimen review was completed for this patient to identify any potential clinically significant medication issues.  High Risk Drug Classes Is patient taking? Indication by Medication  Antipsychotic No   Anticoagulant Yes Apixaban - VTE ppx  Antibiotic No   Opioid Yes Vicodin - prn pain  Antiplatelet No   Hypoglycemics/insulin No   Vasoactive Medication No   Chemotherapy No   Other Yes Alendronate - osteoporosis Cetirizine - allergies Citalopram - Mood Iron - Anemia Pantoprazole - GERD Vitamin D - osteoporosis Methocarbamol - prn spasms     Type of Medication Issue Identified Description of Issue Recommendation(s)  Drug Interaction(s) (clinically significant)     Duplicate Therapy     Allergy     No Medication Administration End Date     Incorrect Dose     Additional Drug Therapy Needed     Significant med changes from prior encounter (inform family/care partners about these prior to discharge).    Other       Clinically significant medication issues were identified that warrant physician communication and completion of prescribed/recommended actions by midnight of the next day:  No  Pharmacist comments: None  Time spent performing this drug regimen review (minutes):  30 minutes   Thank you Okey Regal, PharmD

## 2022-07-20 NOTE — Evaluation (Signed)
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   OCD (obsessive compulsive disorder)      Past Surgical History:       Past Surgical History:  Procedure Laterality Date   ABDOMINAL HYSTERECTOMY       COLONOSCOPY       INTRAMEDULLARY (IM) NAIL INTERTROCHANTERIC Right 07/02/2022    Procedure: INTRAMEDULLARY (IM) NAIL INTERTROCHANTERIC;  Surgeon: Shona Needles, MD;  Location: [place];  Service: Orthopedics;  Laterality: Right;      Assessment & Plan Clinical Impression: Amber Ruiz is an 81 year old female with history of OCD, anxiety, anemia, vertigo in the past; who was admitted after reports of fall onto her right hip the night PTA, she stayed on the floor from 10 pm to 11 am till family found her. She was admitted next day on 07/01/22 for work up. She was found to have right IT femur fracture and underwent ORIF on 08/13 by Dr. Doreatha Martin. Post op WBAT and on Lovenox for DVT prophylaxis. Follow up labs show ABLA which is being monitored. Leucocytosis with rise in WBC to 12.9 being monitored.  PT evaluations done revealing right nystagmus which was treated with modified L Dix Hallpike maneuvers.  OT evaluation completed today revealing pain, decrease in activity tolerance as well as difficulty advancing RLE. CIR recommended due to functional decline. Patient transferred to CIR on 07/03/2022 .   Met with pt today to discuss TR services including leisure education, activity analysis/modifications and stress management.  Also discussed the importance of social, emotional, spiritual health in addition to physical  health and their effects on overall health and wellness.  Pt stated understanding.  Pt is anxious for discharge home tomorrow and hopeful to return to previously enjoyed activities.  Plan  No further TR as pt d/c is tomorrow  Recommendations for other services: None   Discharge Criteria: Patient will be discharged from TR if patient refuses treatment 3 consecutive times without medical reason.  If treatment goals not met, if there is a change in medical status, if patient makes no progress towards goals or if patient is discharged from hospital.  The above assessment, treatment plan, treatment alternatives and goals were discussed and mutually agreed upon: by patient  Wallace 07/20/2022, 3:48 PM

## 2022-07-20 NOTE — Plan of Care (Signed)
  Problem: RH Balance Goal: LTG Patient will maintain dynamic standing with ADLs (OT) Description: LTG:  Patient will maintain dynamic standing balance with assist during activities of daily living (OT)  Outcome: Completed/Met   Problem: Sit to Stand Goal: LTG:  Patient will perform sit to stand in prep for activites of daily living with assistance level (OT) Description: LTG:  Patient will perform sit to stand in prep for activites of daily living with assistance level (OT) Outcome: Completed/Met   Problem: RH Grooming Goal: LTG Patient will perform grooming w/assist,cues/equip (OT) Description: LTG: Patient will perform grooming with assist, with/without cues using equipment (OT) Outcome: Completed/Met   Problem: RH Bathing Goal: LTG Patient will bathe all body parts with assist levels (OT) Description: LTG: Patient will bathe all body parts with assist levels (OT) Outcome: Completed/Met   Problem: RH Dressing Goal: LTG Patient will perform upper body dressing (OT) Description: LTG Patient will perform upper body dressing with assist, with/without cues (OT). Outcome: Completed/Met Goal: LTG Patient will perform lower body dressing w/assist (OT) Description: LTG: Patient will perform lower body dressing with assist, with/without cues in positioning using equipment (OT) Outcome: Completed/Met   Problem: RH Toileting Goal: LTG Patient will perform toileting task (3/3 steps) with assistance level (OT) Description: LTG: Patient will perform toileting task (3/3 steps) with assistance level (OT)  Outcome: Completed/Met   Problem: RH Simple Meal Prep Goal: LTG Patient will perform simple meal prep w/assist (OT) Description: LTG: Patient will perform simple meal prep with assistance, with/without cues (OT). Outcome: Completed/Met   Problem: RH Light Housekeeping Goal: LTG Patient will perform light housekeeping w/assist (OT) Description: LTG: Patient will perform light housekeeping  with assistance, with/without cues (OT). Outcome: Completed/Met   Problem: RH Toilet Transfers Goal: LTG Patient will perform toilet transfers w/assist (OT) Description: LTG: Patient will perform toilet transfers with assist, with/without cues using equipment (OT) Outcome: Completed/Met   Problem: RH Tub/Shower Transfers Goal: LTG Patient will perform tub/shower transfers w/assist (OT) Description: LTG: Patient will perform tub/shower transfers with assist, with/without cues using equipment (OT) Outcome: Completed/Met

## 2022-07-21 MED ORDER — MELATONIN 5 MG PO TABS: 5.0000 mg | ORAL_TABLET | Freq: Every day | ORAL | 0 refills | Status: AC

## 2022-07-21 MED ORDER — ACETAMINOPHEN 325 MG PO TABS: 325.0000 mg | ORAL_TABLET | ORAL | 0 refills | Status: AC | PRN

## 2022-07-21 MED ORDER — VITAMIN D (ERGOCALCIFEROL) 1.25 MG (50000 UNIT) PO CAPS: 50000.0000 [IU] | ORAL_CAPSULE | ORAL | 0 refills | Status: DC

## 2022-07-21 MED ORDER — FEROSUL 325 (65 FE) MG PO TABS
325.0000 mg | ORAL_TABLET | Freq: Two times a day (BID) | ORAL | 3 refills | Status: AC
Start: 2022-07-21 — End: ?

## 2022-07-21 MED ORDER — SENNOSIDES-DOCUSATE SODIUM 8.6-50 MG PO TABS: 2.0000 | ORAL_TABLET | Freq: Every evening | ORAL | 0 refills | Status: AC | PRN

## 2022-07-21 MED ORDER — APIXABAN 2.5 MG PO TABS: 2.5000 mg | ORAL_TABLET | Freq: Two times a day (BID) | ORAL | 0 refills | Status: AC

## 2022-07-21 MED ORDER — VITAMIN D (ERGOCALCIFEROL) 1.25 MG (50000 UNIT) PO CAPS: 50000.0000 [IU] | ORAL_CAPSULE | ORAL | 0 refills | Status: AC

## 2022-07-21 NOTE — Progress Notes (Signed)
Inpatient Rehabilitation Care Coordinator Discharge Note   Patient Details  Name: Amber Ruiz MRN: [id number] Date of Birth: [birth year]   Discharge location: D/c to home  Length of Stay: 17 days  Discharge activity level: Supervision  Home/community participation: Limited  Patient response RW:ERXVQM Literacy - How often do you need to have someone help you when you read instructions, pamphlets, or other written material from your doctor or pharmacy?: Rarely  Patient response GQ:[id number] Isolation - How often do you feel lonely or isolated from those around you?: Never  Services provided included: MD, RD, PT, OT, SLP, RN, CM, Pharmacy, Neuropsych, SW, TR  Financial Services:  Field seismologist Utilized: Medicare    Choices offered to/list presented to: Yes  Follow-up services arranged:  Home Health, DME Home Health Agency: [facility] for HHPT/OT/SLP/aide    DME : [facility] for hospital bed, 3in1 BSC and shower seat with back    Patient response to transportation need: Is the patient able to respond to transportation needs?: Yes In the past 12 months, has lack of transportation kept you from medical appointments or from getting medications?: No In the past 12 months, has lack of transportation kept you from meetings, work, or from getting things needed for daily living?: No   Comments (or additional information):  Patient/Family verbalized understanding of follow-up arrangements:  Yes  Individual responsible for coordination of the follow-up plan: contact pt son [name] or dtr Vernona Rieger  Confirmed correct DME delivered: Gretchen Short 07/21/2022    Gretchen Short

## 2022-07-21 NOTE — Progress Notes (Signed)
Patient ID: Amber Ruiz, female   DOB: [birth year], 81 y.o.   MRN: [id number]  INPATIENT REHABILITATION DISCHARGE NOTE   Discharge instructions by: Marissa Nestle, PA-C  Verbalized understanding: Yes  Skin care/Wound care healing? Incision healing  Pain: none reported  IV's: n/a  Tubes/Drains: n/a  O2: n/a  Safety instructions: given by PA  Patient belongings: returned to patient  Discharged to: home  Discharged via: car  Notes:  Kennyth Arnold, RN3, BSN, CBIS, CRRN, [facility]

## 2022-07-21 NOTE — Progress Notes (Signed)
PROGRESS NOTE   Subjective/Complaints:  Pt feeling great this AM- eating well and slept great with melatonin- easy to wake up this AM.   Incision has no staples/sutures that I could see.  ROS:  Pt denies SOB, abd pain, CP, N/V/C/D, and vision changes     Objective:   DG Lumbar Spine 2-3 Views  Result Date: 07/19/2022 CLINICAL DATA:  Provided history: Abnormal x-ray of lumbar spine EXAM: LUMBAR SPINE - 2-3 VIEW COMPARISON:  Abdominal radiographs 07/19/2022. Thoracic spine radiographs 08/26/2018 and 03/24/2012. Remote lumbar spine radiographs 03/27/2011. FINDINGS: There are 5 lumbar type vertebral bodies. The bones are demineralized. There is a mild convex left scoliosis. The lateral alignment is near anatomic. There is a chronic superior endplate compression deformity at L2 which appears unchanged from radiographs of 2012. No acute lumbar spine fractures are identified. There are mild facet degenerative changes inferiorly. Previous right hip ORIF noted. 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 No acute osseous findings. Electronically Signed   By: Carey Bullocks M.D.   On: 07/19/2022 13:26   DG Abd 1 View  Result Date: 07/19/2022 CLINICAL DATA:  w 81 year old female presenting for evaluation of constipation and delayed colonic transit. EXAM: ABDOMEN - 1 VIEW COMPARISON:  Hip evaluation of July 17, 2022 FINDINGS: Lung bases not well evaluated. Bowel gas pattern without signs of obstruction. Moderate stool burden throughout the colon. Fecal material in the area of the rectum. No abnormal calcifications. Soft tissues otherwise without abnormality. Post ORIF of RIGHT hip fracture partially visualized. Mild levo convexity of the spine with degenerative changes. Query L2 compression fracture new since thoracic spine imaging from October of 2019. Areas not well evaluated. IMPRESSION: Findings suggestive of  constipation. Query L2 fracture new since previous imaging. Areas not well evaluated. Correlate with any new symptoms in this location. These results will be called to the ordering clinician or representative by the Radiologist Assistant, and communication documented in the PACS or [employer]. Electronically Signed   By: Donzetta Kohut M.D.   On: 07/19/2022 09:22    No results for input(s): "WBC", "HGB", "HCT", "PLT" in the last 72 hours.   No results for input(s): "NA", "K", "CL", "CO2", "GLUCOSE", "BUN", "CREATININE", "CALCIUM" in the last 72 hours.    Intake/Output Summary (Last 24 hours) at 07/21/2022 0908 Last data filed at 07/21/2022 0817 Gross per 24 hour  Intake 531 ml  Output --  Net 531 ml        Physical Exam: Vital Signs Blood pressure 123/61, pulse 71, temperature 98.1 F (36.7 C), temperature source Oral, resp. rate 16, height 5' (1.524 m), weight 66.1 kg, SpO2 98 %.        General: awake, alert, appropriate, sitting up eating breakfast; 100% tray eaten; NAD HENT: conjugate gaze; oropharynx moist CV: regular rate; no JVD Pulmonary: CTA B/L; no W/R/R- good air movement GI: soft, NT, ND, (+)BS Psychiatric: appropriate- bright chipper Neurological: Ox3 Musculoskeletal:        General: swelling RLE especially proximally > LLE--stable    Cervical back: Normal range of motion.  Skin: incisions on R thigh healing- no sutures/staples    General: Skin is warm and  dry    Comments: Right hip incision  CDI. Surrounding bruising along thigh R>L- outer and inner thigh Neurological:     Mental Status: She is alert and oriented to person, place, and time.     No nystagmus appreciated    Cranial Nerves: CN 2-12 grossly I ntact    Sensory: No sensory deficit.     Coordination: Coordination normal.     Comments: Right hip limited by pain. Otherwise motor 5/5 UE, 3-4+/5 LLE, Right ADF/APF 4/5. No sensory findings. DTR's 1+     Assessment/Plan: 1. Functional deficits  which require 3+ hours per day of interdisciplinary therapy in a comprehensive inpatient rehab setting. Physiatrist is providing close team supervision and 24 hour management of active medical problems listed below. Physiatrist and rehab team continue to assess barriers to discharge/monitor patient progress toward functional and medical goals  Care Tool:  Bathing    Body parts bathed by patient: Right arm, Left arm, Chest, Abdomen, Right upper leg, Face, Buttocks, Front perineal area, Left upper leg, Right lower leg, Left lower leg   Body parts bathed by helper: Right lower leg, Left lower leg     Bathing assist Assist Level: Supervision/Verbal cueing     Upper Body Dressing/Undressing Upper body dressing   What is the patient wearing?: Pull over shirt, Bra    Upper body assist Assist Level: Independent    Lower Body Dressing/Undressing Lower body dressing      What is the patient wearing?: Pants, Incontinence brief     Lower body assist Assist for lower body dressing: Supervision/Verbal cueing     Toileting Toileting    Toileting assist Assist for toileting: Supervision/Verbal cueing     Transfers Chair/bed transfer  Transfers assist  Chair/bed transfer activity did not occur: Safety/medical concerns  Chair/bed transfer assist level: Supervision/Verbal cueing     Locomotion Ambulation   Ambulation assist   Ambulation activity did not occur: Safety/medical concerns  Assist level: Supervision/Verbal cueing Assistive device: Walker-rolling Max distance: 265ft   Walk 10 feet activity   Assist  Walk 10 feet activity did not occur: Safety/medical concerns  Assist level: Supervision/Verbal cueing Assistive device: Walker-rolling   Walk 50 feet activity   Assist Walk 50 feet with 2 turns activity did not occur: Safety/medical concerns  Assist level: Supervision/Verbal cueing Assistive device: Walker-rolling    Walk 150 feet activity   Assist  Walk 150 feet activity did not occur: Safety/medical concerns  Assist level: Supervision/Verbal cueing Assistive device: Walker-rolling    Walk 10 feet on uneven surface  activity   Assist Walk 10 feet on uneven surfaces activity did not occur: Safety/medical concerns   Assist level: Supervision/Verbal cueing Assistive device: Walker-rolling   Wheelchair     Assist Is the patient using a wheelchair?: No Type of Wheelchair: Manual Wheelchair activity did not occur: Safety/medical concerns         Wheelchair 50 feet with 2 turns activity    Assist    Wheelchair 50 feet with 2 turns activity did not occur: Safety/medical concerns       Wheelchair 150 feet activity     Assist  Wheelchair 150 feet activity did not occur: Safety/medical concerns       Blood pressure 123/61, pulse 71, temperature 98.1 F (36.7 C), temperature source Oral, resp. rate 16, height 5' (1.524 m), weight 66.1 kg, SpO2 98 %. Medical Problem List and Plan: 1. Functional deficits secondary to a right intertrochanteric hip fx s/p ORIF 07/01/22             -  patient may shower starting 8/16 if no drainage from incisions             -ELOS/Goals: 10-14 day, mod I with PT and OT  D/c 9/1  D/c tomorrow- con't CIR- PT and OT - won't need f/u with me- only Ortho  D/c today- if needs more pain meds advised pt to call Ortho 2.  Antithrombotics: -DVT/anticoagulation:  Pharmaceutical: Lovenox - LE dopplers negative--encourage elevation of legs when possible/ TEDS 8/31- will change to Eliquis 2.5 mg BID x 30 days per Ortho             -antiplatelet therapy: N/a 3. Pain Management: Hydrocodone prn.   8/28- pain doing better- taking less pain meds  8/31- advised pt will get 7 days of meds- needs to f/u with Ortho for more pain meds- suggested taking less to improve constipation issues.  4. Mood/Behavior/Sleep: LCSW to follow for evaluation and support. 8/24- not sleeping- per #13   8/31- d/c  trazodone and started melatonin 5 mg QHS             -antipsychotic agents: N/A 5. Neuropsych/cognition: This patient is capable of making decisions on her own behalf. 6. Skin/Wound Care: Routine pressure relief measures.  7. Fluids/Electrolytes/Nutrition: Monitor I/O.  .   -emptying without issues currently  -stopped bladder scans  -encourage voiding out of bed when possible 8. Anxiety d/o: Managed with celexa. Intermittent anxiety attacks  -team providing ego support on a daily basis  -have requested neuropsych visit  -would like to avoid benzos if possible, consider low dose xanax prn potentially  8/25- doing ok without benzo's- will con't OFF benzo's 9. ABLA:  .   -HGB down to 8.1 8/15--> 8.8  8/18  8/24- Hb 9.9- con't to monitor  8/28- Hb 11.4- doing better 10. Leucocytosis: wound clean, chest clear. Likely reactive             -Improved to 8.4 8/15  8/21- WBC 7.1k- doing well  8/28- WBC 4.3k 11.  Constipation: improved -continue senna-s.   8/23- no BM with sorbitol- ordered more this afternoon no BM still- will give SSE this evening if no large BM  8/24- LBM last evening large  8/26- Scheduled miralax  8/28- added sorbitol again and SSE  8/29- had BM yesterday AM, but no results with enema/sorbitol last evening so far- will monitor- if no BM by tomorrow, will get KUB  8/31- got KUB- full of stool- got cleaned out last night- feeling better 12. Confusion/sedation  8/23- checked U/A and Cx- U/A equivocal for UTI- will wait for U Cx.   8/24- d/w pt need to drink more to push fluids so can avid a UTI. Not drinking enough  8/26 Amoxicillin for 5 days started for Ecoli UTI started yesterday  8/28- cognitively, doing much better 13. Insomnia  8/24- will increase trazodone to 75 mg QHS for sleep and change to scheduled since pt not sleeping well  8/25- slept much better, however still sleepy at 6:30am- explained many people need 8 hours of sleep- she was fine with this- will go back  to sleep for now  8/29- sleeping with hangover in Am- will reduce Trazodone to 50 mg QHS prn  8/30- decrease trazodone to 25 mg QHS  8/31- changed to Melatonin- stopped trazodone since so sleepy in AM  9/1- doing much better- don't take trazodone in future 14. L2 chronic fx-   8/31- found on KUB- got dedicated Lumbar xray- is chronic and been  stable since 2013 xray.   I spent a total of 52   minutes on total care today- >50% coordination of care- due to 40 minutes in room with pt- she wanted to disucss sleep; problems with bowels last night and Eliquis per Ortho   Based on pt's decreased level of function, needs hospital bed for sitting -up and down; as well as for transfers to w/c.   LOS: 18 days A FACE TO FACE EVALUATION WAS PERFORMED  Pernell Lenoir 07/21/2022, 9:08 AM

## 2022-08-18 ENCOUNTER — Other Ambulatory Visit: Payer: Self-pay | Admitting: Physical Medicine and Rehabilitation

## 2022-08-23 ENCOUNTER — Other Ambulatory Visit: Payer: Self-pay | Admitting: Physical Medicine and Rehabilitation

## 2022-11-09 ENCOUNTER — Ambulatory Visit: Payer: Medicare Other | Admitting: Neurology

## 2023-01-26 ENCOUNTER — Other Ambulatory Visit: Payer: Self-pay

## 2023-01-26 ENCOUNTER — Encounter (HOSPITAL_COMMUNITY): Payer: Self-pay

## 2023-01-26 ENCOUNTER — Emergency Department (HOSPITAL_COMMUNITY)
Admission: EM | Admit: 2023-01-26 | Discharge: 2023-01-27 | Disposition: A | Discharge status: Home / Self Care | Payer: Medicare Other | Attending: Emergency Medicine | Admitting: Emergency Medicine

## 2023-01-26 ENCOUNTER — Emergency Department (HOSPITAL_BASED_OUTPATIENT_CLINIC_OR_DEPARTMENT_OTHER): Payer: Medicare Other

## 2023-01-26 DIAGNOSIS — Z7901 Long term (current) use of anticoagulants: Secondary | ICD-10-CM | POA: Insufficient documentation

## 2023-01-26 DIAGNOSIS — L03115 Cellulitis of right lower limb: Secondary | ICD-10-CM

## 2023-01-26 DIAGNOSIS — M7989 Other specified soft tissue disorders: Secondary | ICD-10-CM | POA: Diagnosis present

## 2023-01-26 LAB — CBC WITH DIFFERENTIAL/PLATELET
Abs Immature Granulocytes: 0.02 10*3/uL (ref 0.00–0.07)
Basophils Absolute: 0.1 10*3/uL (ref 0.0–0.1)
Basophils Relative: 1 %
Eosinophils Absolute: 0.1 10*3/uL (ref 0.0–0.5)
Eosinophils Relative: 2 %
HCT: 39.9 % (ref 36.0–46.0)
Hemoglobin: 12.5 g/dL (ref 12.0–15.0)
Immature Granulocytes: 0 %
Lymphocytes Relative: 24 %
Lymphs Abs: 1.7 10*3/uL (ref 0.7–4.0)
MCH: 27.7 pg (ref 26.0–34.0)
MCHC: 31.3 g/dL (ref 30.0–36.0)
MCV: 88.5 fL (ref 80.0–100.0)
Monocytes Absolute: 0.7 10*3/uL (ref 0.1–1.0)
Monocytes Relative: 10 %
Neutro Abs: 4.4 10*3/uL (ref 1.7–7.7)
Neutrophils Relative %: 63 %
Platelets: 263 10*3/uL (ref 150–400)
RBC: 4.51 MIL/uL (ref 3.87–5.11)
RDW: 13.6 % (ref 11.5–15.5)
WBC: 7 10*3/uL (ref 4.0–10.5)
nRBC: 0 % (ref 0.0–0.2)

## 2023-01-26 LAB — BASIC METABOLIC PANEL
Anion gap: 10 (ref 5–15)
BUN: 11 mg/dL (ref 8–23)
CO2: 26 mmol/L (ref 22–32)
Calcium: 8.9 mg/dL (ref 8.9–10.3)
Chloride: 103 mmol/L (ref 98–111)
Creatinine, Ser: 0.63 mg/dL (ref 0.44–1.00)
GFR, Estimated: >60 mL/min (ref >60)
Glucose, Bld: 95 mg/dL (ref 70–99)
Potassium: 3.5 mmol/L (ref 3.5–5.1)
Sodium: 139 mmol/L (ref 135–145)

## 2023-01-26 LAB — TROPONIN I (HIGH SENSITIVITY): Troponin I (High Sensitivity): 7 ng/L (ref <18)

## 2023-01-26 MED ORDER — ONDANSETRON 4 MG PO TBDP
4.0000 mg | ORAL_TABLET | Freq: Once | ORAL | Status: AC
  Administered 2023-01-26: 4 mg via ORAL
  Filled 2023-01-26: qty 1

## 2023-01-26 MED ORDER — HYDROCHLOROTHIAZIDE 12.5 MG PO TABS
12.5000 mg | ORAL_TABLET | Freq: Every day | ORAL | Status: DC
  Administered 2023-01-26: 12.5 mg via ORAL
  Filled 2023-01-26: qty 1

## 2023-01-26 MED ORDER — DOXYCYCLINE HYCLATE 100 MG PO CAPS
100.0000 mg | ORAL_CAPSULE | Freq: Two times a day (BID) | ORAL | 0 refills | Status: DC
Start: 2023-01-26 — End: 2023-12-27

## 2023-01-26 MED ORDER — DOXYCYCLINE HYCLATE 100 MG PO TABS
100.0000 mg | ORAL_TABLET | Freq: Once | ORAL | Status: AC
  Administered 2023-01-26: 100 mg via ORAL
  Filled 2023-01-26: qty 1

## 2023-01-26 NOTE — ED Provider Triage Note (Signed)
Emergency Medicine Provider Triage Evaluation Note  Amber Ruiz , a 82 y.o. female  was evaluated in triage.  Pt complains of right foot swelling, went to PCP, PACs on EKG, sent to ER for concern for DVT. Not on thinners, prior right hip fx.  Review of Systems  Positive: Swelling right foot Negative: CP, SHOB  Physical Exam  BP (!) 165/86   Pulse 71   Resp 16   SpO2 98%  Gen:   Awake, no distress   Resp:  Normal effort  MSK:   Moves extremities without difficulty  Other:  Mild swelling right foot, DP pulse present, no calf tenderness   Medical Decision Making  Medically screening exam initiated at 4:37 PM.  Appropriate orders placed.  Amber Ruiz was informed that the remainder of the evaluation will be completed by another provider, this initial triage assessment does not replace that evaluation, and the importance of remaining in the ED until their evaluation is complete.     Tacy Learn, PA-C 01/26/23 [phone]

## 2023-01-26 NOTE — ED Notes (Signed)
Pt in vascular.

## 2023-01-26 NOTE — ED Notes (Signed)
Pt had an episode of emesis in the bathroom, Dr.Haviland aware.

## 2023-01-26 NOTE — ED Triage Notes (Signed)
Pt reports swelling to her right ankle that she noticed yesterday. She went to her doctor today where she had an EKG that showed PACs. Her doctor sent her here to the ER. Pt denies CP/SOB.

## 2023-01-26 NOTE — Progress Notes (Signed)
RLE venous duplex has been completed.  Preliminary results given to Suella Broad, PA-C.   Results can be found under chart review under CV PROC. 01/26/2023 5:50 PM Sarye Kath RVT, RDMS

## 2023-01-26 NOTE — ED Provider Notes (Signed)
[facility] Provider Note   CSN: [id number] Arrival date & time: 01/26/23  1621     History  Chief Complaint  Patient presents with   Leg Swelling   Abnormal ECG    Amber Ruiz is a 82 y.o. female.  Pt is a 82 yo female with pmhx significant for ocd, migraines, anemia, and hiatal hernia.  Pt has had right leg swelling for the past 2 days.  She did see pcp who did some blood work.  DDimer +, so she was told to come to the ED.  She also had some PACs on the EKG.  Pt denies sob or cp.  Pt is not on blood thinners.       Home Medications Prior to Admission medications   Medication Sig Start Date End Date Taking? Authorizing Provider  doxycycline (VIBRAMYCIN) 100 MG capsule Take 1 capsule (100 mg total) by mouth 2 (two) times daily. 01/26/23  Yes Isla Pence, MD  acetaminophen (TYLENOL) 325 MG tablet Take 1-2 tablets (325-650 mg total) by mouth every 4 (four) hours as needed for mild pain. 07/21/22   Love, Ivan Anchors, PA-C  alendronate (FOSAMAX) 70 MG tablet Take 70 mg by mouth once a week. 01/14/18   [provider]  apixaban (ELIQUIS) 2.5 MG TABS tablet Take 1 tablet (2.5 mg total) by mouth 2 (two) times daily. 07/21/22   Love, Ivan Anchors, PA-C  cetirizine (ZYRTEC) 5 MG tablet Take 5 mg by mouth daily.    [provider]  citalopram (CELEXA) 40 MG tablet Take 20 mg by mouth daily.     [provider]  FEROSUL 325 (65 Fe) MG tablet Take 1 tablet (325 mg total) by mouth 2 (two) times daily with a meal. 07/21/22   Love, Ivan Anchors, PA-C  melatonin 5 MG TABS Take 1 tablet (5 mg total) by mouth at bedtime. 07/21/22   Love, Ivan Anchors, PA-C  pantoprazole (PROTONIX) 20 MG tablet Take 20 mg by mouth daily.    [provider]  polyethylene glycol (MIRALAX / GLYCOLAX) 17 g packet Take 17 g by mouth daily as needed for mild constipation. 07/03/22   Geradine Girt, DO  senna-docusate (SENOKOT-S) 8.6-50 MG tablet Take 2 tablets  by mouth at bedtime as needed for mild constipation. 07/21/22   Love, Ivan Anchors, PA-C  Vitamin D, Ergocalciferol, (DRISDOL) 1.25 MG (50000 UNIT) CAPS capsule Take 1 capsule (50,000 Units total) by mouth every 7 (seven) days. 07/21/22   Love, Ivan Anchors, PA-C      Allergies    Polyoxyethylene 40 sorbitol septaoleate [sorbitan]    Review of Systems   Review of Systems  Musculoskeletal:        Right leg swelling  All other systems reviewed and are negative.   Physical Exam Updated Vital Signs BP (!) 164/84 (BP Location: Right Arm)   Pulse 78   Temp 98.3 F (36.8 C)   Resp 16   Ht 5' (1.524 m)   Wt 65.8 kg   SpO2 98%   BMI 28.32 kg/m  Physical Exam Vitals and nursing note reviewed.  Constitutional:      Appearance: Normal appearance.  HENT:     Head: Normocephalic and atraumatic.     Right Ear: External ear normal.     Left Ear: External ear normal.     Nose: Nose normal.     Mouth/Throat:     Mouth: Mucous membranes are moist.  Pharynx: Oropharynx is clear.  Eyes:     Extraocular Movements: Extraocular movements intact.     Conjunctiva/sclera: Conjunctivae normal.     Pupils: Pupils are equal, round, and reactive to light.  Cardiovascular:     Rate and Rhythm: Normal rate and regular rhythm.     Pulses: Normal pulses.     Heart sounds: Normal heart sounds.  Pulmonary:     Effort: Pulmonary effort is normal.     Breath sounds: Normal breath sounds.  Abdominal:     General: Abdomen is flat. Bowel sounds are normal.     Palpations: Abdomen is soft.  Musculoskeletal:     Cervical back: Normal range of motion and neck supple.     Right lower leg: Edema present.     Comments: RLE swelling.  Some redness to right foot.  Left foot also starting to swell, but less than right.  Skin:    General: Skin is warm.     Capillary Refill: Capillary refill takes less than 2 seconds.  Neurological:     General: No focal deficit present.     Mental Status: She is alert and oriented  to person, place, and time.  Psychiatric:        Mood and Affect: Mood normal.        Behavior: Behavior normal.     ED Results / Procedures / Treatments   Labs (all labs ordered are listed, but only abnormal results are displayed) Labs Reviewed  CBC WITH DIFFERENTIAL/PLATELET  BASIC METABOLIC PANEL  TROPONIN I (HIGH SENSITIVITY)    EKG EKG Interpretation  Date/Time:  Friday January 26 2023 16:36:32 EST Ventricular Rate:  67 PR Interval:  158 QRS Duration: 70 QT Interval:  416 QTC Calculation: 439 R Axis:   28 Text Interpretation: Normal sinus rhythm Normal ECG When compared with ECG of 01-Jul-2022 17:26, PREVIOUS ECG IS PRESENT No significant change since last tracing Confirmed by Isla Pence [phone]) on 01/26/2023 9:05:48 PM  Radiology VAS [place] LOWER EXTREMITY VENOUS (DVT) (7a-7p)  Result Date: 01/26/2023  Lower Venous DVT Study Patient Name:  Amber Ruiz  Date of Exam:   01/26/2023 Medical Rec #: [id number]        Accession #:    [id number] Date of Birth: Jun 05, 1941         Patient Gender: F Patient Age:   57 years Exam Location:  [facility] Procedure:      VAS [place] LOWER EXTREMITY VENOUS (DVT) Referring Phys: Mickel Baas MURPHY --------------------------------------------------------------------------------  Indications: RLE foot/ankle edema.  Comparison Study: Previous exam on 07/07/22 was negative for DVT Performing Technologist: Rogelia Rohrer RVT, RDMS  Examination Guidelines: A complete evaluation includes B-mode imaging, spectral Doppler, color Doppler, and power Doppler as needed of all accessible portions of each vessel. Bilateral testing is considered an integral part of a complete examination. Limited examinations for reoccurring indications may be performed as noted. The reflux portion of the exam is performed with the patient in reverse Trendelenburg.  +---------+---------------+---------+-----------+----------+--------------+ RIGHT     CompressibilityPhasicitySpontaneityPropertiesThrombus Aging +---------+---------------+---------+-----------+----------+--------------+ CFV      Full           Yes      Yes                                 +---------+---------------+---------+-----------+----------+--------------+ SFJ      Full                                                        +---------+---------------+---------+-----------+----------+--------------+  FV Prox  Full           Yes      Yes                                 +---------+---------------+---------+-----------+----------+--------------+ FV Mid   Full           Yes      Yes                                 +---------+---------------+---------+-----------+----------+--------------+ FV DistalFull           Yes      Yes                                 +---------+---------------+---------+-----------+----------+--------------+ PFV      Full                                                        +---------+---------------+---------+-----------+----------+--------------+ POP      Full           Yes      Yes                                 +---------+---------------+---------+-----------+----------+--------------+ PTV      Full                                                        +---------+---------------+---------+-----------+----------+--------------+ PERO     Full                                                        +---------+---------------+---------+-----------+----------+--------------+   +----+---------------+---------+-----------+----------+--------------+ LEFTCompressibilityPhasicitySpontaneityPropertiesThrombus Aging +----+---------------+---------+-----------+----------+--------------+ CFV Full           Yes      Yes                                 +----+---------------+---------+-----------+----------+--------------+     Summary: RIGHT: - There is no evidence of deep vein thrombosis in the lower  extremity.  - No cystic structure found in the popliteal fossa. Subcutaneous edema in area of the ankle.  LEFT: - No evidence of common femoral vein obstruction.  *See table(s) above for measurements and observations.    Preliminary     Procedures Procedures    Medications Ordered in ED Medications  hydrochlorothiazide (HYDRODIURIL) tablet 12.5 mg (12.5 mg Oral Given 01/26/23 2154)  doxycycline (VIBRA-TABS) tablet 100 mg (has no administration in time range)  doxycycline (VIBRA-TABS) tablet 100 mg (100 mg Oral Given 01/26/23 2154)  ondansetron (ZOFRAN-ODT) disintegrating tablet 4 mg (4 mg Oral Given 01/26/23 2336)    ED Course/ Medical Decision Making/ A&P  Medical Decision Making Risk Prescription drug management.   This patient presents to the ED for concern of leg swelling, this involves an extensive number of treatment options, and is a complaint that carries with it a high risk of complications and morbidity.  The differential diagnosis includes dvt, cellulitis, msk, peripheral edema   Co morbidities that complicate the patient evaluation  ocd, migraines, anemia, and hiatal hernia.   Additional history obtained:  Additional history obtained from epic chart review External records from outside source obtained and reviewed including son   Lab Tests:  I Ordered, and personally interpreted labs.  The pertinent results include:  cbc nl, bmp nl   Imaging Studies ordered:  I ordered imaging studies including [place]  I independently visualized and interpreted imaging which showed  There is no evidence of deep vein thrombosis in the lower extremity.     - No cystic structure found in the popliteal fossa.  Subcutaneous edema in area of the ankle.     LEFT:  - No evidence of common femoral vein obstruction.   I agree with the radiologist interpretation   Cardiac Monitoring:  The patient was maintained on a cardiac monitor.  I personally viewed and  interpreted the cardiac monitored which showed an underlying rhythm of: nsr   Medicines ordered and prescription drug management:  I ordered medication including doxy  for cellulitis and hctz for swelling  Reevaluation of the patient after these medicines showed that the patient improved I have reviewed the patients home medicines and have made adjustments as needed   Test Considered:  [place]    Problem List / ED Course:  R leg swelling:  no DVT.  Likely cellulitis.  Pt is stable for d/c.  Return if worse.  F/u with pcp.   Reevaluation:  After the interventions noted above, I reevaluated the patient and found that they have :improved   Social Determinants of Health:  Lives at home   Dispostion:  After consideration of the diagnostic results and the patients response to treatment, I feel that the patent would benefit from discharge with outpatient f/u.          Final Clinical Impression(s) / ED Diagnoses Final diagnoses:  Cellulitis of right lower extremity    Rx / DC Orders ED Discharge Orders          Ordered    doxycycline (VIBRAMYCIN) 100 MG capsule  2 times daily        01/26/23 2340              Isla Pence, MD 01/26/23 2341

## 2023-03-07 ENCOUNTER — Encounter: Payer: Self-pay | Admitting: Neurology

## 2023-05-16 ENCOUNTER — Ambulatory Visit: Payer: Medicare Other | Admitting: Neurology

## 2023-05-16 ENCOUNTER — Telehealth: Payer: Self-pay | Admitting: Neurology

## 2023-05-16 NOTE — Telephone Encounter (Signed)
Dr Dohmeier called the patient to discuss her visit scheduled today. Pt advised that she did not have transportation to make todays visit and her son must have forgotten to call and cancel the appt

## 2023-06-05 ENCOUNTER — Ambulatory Visit: Payer: Medicare Other | Admitting: Neurology

## 2023-08-16 ENCOUNTER — Institutional Professional Consult (permissible substitution): Payer: Medicare Other | Admitting: Neurology

## 2023-08-28 ENCOUNTER — Encounter: Payer: Self-pay | Admitting: *Deleted

## 2023-08-28 DIAGNOSIS — Z961 Presence of intraocular lens: Secondary | ICD-10-CM | POA: Insufficient documentation

## 2023-08-28 DIAGNOSIS — E785 Hyperlipidemia, unspecified: Secondary | ICD-10-CM | POA: Insufficient documentation

## 2023-08-28 DIAGNOSIS — I10 Essential (primary) hypertension: Secondary | ICD-10-CM | POA: Insufficient documentation

## 2023-08-28 DIAGNOSIS — M1990 Unspecified osteoarthritis, unspecified site: Secondary | ICD-10-CM | POA: Insufficient documentation

## 2023-08-28 DIAGNOSIS — R351 Nocturia: Secondary | ICD-10-CM | POA: Insufficient documentation

## 2023-08-28 DIAGNOSIS — M810 Age-related osteoporosis without current pathological fracture: Secondary | ICD-10-CM | POA: Insufficient documentation

## 2023-08-28 DIAGNOSIS — K219 Gastro-esophageal reflux disease without esophagitis: Secondary | ICD-10-CM | POA: Insufficient documentation

## 2023-08-28 DIAGNOSIS — F32A Depression, unspecified: Secondary | ICD-10-CM | POA: Insufficient documentation

## 2023-08-28 DIAGNOSIS — H814 Vertigo of central origin: Secondary | ICD-10-CM | POA: Insufficient documentation

## 2023-08-28 DIAGNOSIS — H02036 Senile entropion of left eye, unspecified eyelid: Secondary | ICD-10-CM | POA: Insufficient documentation

## 2023-08-28 DIAGNOSIS — I491 Atrial premature depolarization: Secondary | ICD-10-CM | POA: Insufficient documentation

## 2023-08-28 DIAGNOSIS — G4730 Sleep apnea, unspecified: Secondary | ICD-10-CM | POA: Insufficient documentation

## 2023-08-28 DIAGNOSIS — R0602 Shortness of breath: Secondary | ICD-10-CM | POA: Insufficient documentation

## 2023-08-28 DIAGNOSIS — D500 Iron deficiency anemia secondary to blood loss (chronic): Secondary | ICD-10-CM | POA: Insufficient documentation

## 2023-08-29 ENCOUNTER — Institutional Professional Consult (permissible substitution): Payer: Medicare Other | Admitting: Neurology

## 2023-12-12 ENCOUNTER — Institutional Professional Consult (permissible substitution): Payer: Medicare Other | Admitting: Neurology

## 2023-12-18 ENCOUNTER — Telehealth: Payer: Self-pay | Admitting: Neurology

## 2023-12-18 NOTE — Telephone Encounter (Signed)
Appointment time confirmed

## 2023-12-20 ENCOUNTER — Telehealth: Payer: Self-pay | Admitting: Neurology

## 2023-12-20 NOTE — Telephone Encounter (Signed)
Pt called to verify appointment

## 2023-12-21 ENCOUNTER — Ambulatory Visit (INDEPENDENT_AMBULATORY_CARE_PROVIDER_SITE_OTHER): Payer: Self-pay | Admitting: Neurology

## 2023-12-21 DIAGNOSIS — R42 Dizziness and giddiness: Secondary | ICD-10-CM

## 2023-12-21 NOTE — Progress Notes (Signed)
Patient came very late to her appointment. Appointment was 845. She was supposed to be here by 815. She did not come into the office until after 9am. Apologized and explained if I squeeze her in today, will other other patients scheduled at regular intervals I would not be able to give her the best care. Offered Feb 6 at 4pm be here at 330 and that is the end of my day and I can dedicate extra time to her and her son and give her the best care possible.

## 2023-12-27 ENCOUNTER — Ambulatory Visit (INDEPENDENT_AMBULATORY_CARE_PROVIDER_SITE_OTHER): Payer: Medicare Other | Admitting: Neurology

## 2023-12-27 VITALS — BP 132/80 | HR 76 | Ht 60.0 in | Wt 138.4 lb

## 2023-12-27 DIAGNOSIS — R600 Localized edema: Secondary | ICD-10-CM | POA: Diagnosis not present

## 2023-12-27 DIAGNOSIS — L539 Erythematous condition, unspecified: Secondary | ICD-10-CM | POA: Diagnosis not present

## 2023-12-27 DIAGNOSIS — R519 Headache, unspecified: Secondary | ICD-10-CM | POA: Diagnosis not present

## 2023-12-27 DIAGNOSIS — M79671 Pain in right foot: Secondary | ICD-10-CM | POA: Diagnosis not present

## 2023-12-27 DIAGNOSIS — M79672 Pain in left foot: Secondary | ICD-10-CM

## 2023-12-27 DIAGNOSIS — G8929 Other chronic pain: Secondary | ICD-10-CM

## 2023-12-27 NOTE — Progress Notes (Signed)
 [facility]    Provider:  Dr Ines Requesting Provider: Evangelina Tinnie Brier* Primary Care Provider:  Dwane Odor, PA-C  CC:  Neuropathy   HPI:  Amber Ruiz is a 83 y.o. female here as requested by Evangelina Tinnie Elizab*for parasthesias. .  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  She states her feet are red, tight, uncomfortable, swelling and they change colors. Feel tight.  Patient is here with her daughter-in-law who provides information.  Patient states that she has been having problems with swelling in her legs.  Her feet can change colors.  She feels better when propping them up and worse when they are dependent.  In the office today she has dependent rubor, pitting edema, she is a poor historian.  We spent an extended visit talking about dependent rubor likely being a vascular causes.  Also her sensory exam is intact to cold/pinprick/light touch/vibration and proprioception distally in the lower extremities and there is a definite difference when patient has her legs dependent versus up on a chair, there is dependent rubor in the feet and pitting edema.  Recommend she follow-up with primary care for evaluation of vascular causes.  Also she has not been taking her medications, daughter and son are aware and we recommended medication packs and also consider  memory loss and further evaluate whether patient should be living independently.  Patient also states that her head hurts all the time.  She cannot give me a timeframe on that.  Last time she was here she denied any headaches whatsoever.  It could be that she has not been taking her metoprolol  recently and also has not been taking her Eliquis  and possibly other medications.  I recommended she restart her medications, and see how this affects her headaches, we also recommended a sleep study several years ago that was ordered was never completed.  I can send her an overnight O2 monitor as she states that she does not take naps, she is not tired during the day, denies snoring, denies morning headaches but then contradicts herself stating that she has headaches 24 x 7 in does not wake up in the morning feeling refreshed.  Also under an overnight oxygenation monitor.  Family should assess patient to see if they feel comfortable with her living independently since she is forgetting her medications and she does not appear to be a very good historian, daughter-in-law and son are aware of this.  Again we recommended getting her help and also other supports such as prepackaged medications.   Reviewed notes, labs and imaging from outside physicians, which showed:   I reviewed neurology notes: Patient was initially seen by Dr. Jenel in our practice in August 2020 for onset of vertigo.  She also reported dizziness, memory problems and gait instability.  There is no mention of neuropathy in the feet however her gait was slightly wide-based possibly due to vertigo otherwise appeared normal.  MRI of the brain September 2020 results showed mild small vessel disease, unremarkable, seventh 8th nerve complexes appeared normal.  She was seen after that and followed for episodic headache and vertigo possibly migraine or vertigo associated migraines, sent to vestibular rehab.  She was followed by her nurse practitioner Lauraine Born at the end of 2021 and in 2022 for same chief complaint.  In April 2017 patient's PCP was concerned about patient's memory and placed a formal referral to our office for memory impairment.  A search on epic globally for neuropathy showed no results.  I also reviewed her primary care referral which states that she has had tingling in both feet for the last few months, she denies injuries low back pain weakness, she does have longstanding prediabetes and history of B12 deficiency.  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  It appears she is on chronic long-term B12.  She has had a heart monitor in the past 8 to 14 days 22 episodes of A. tach lasting 4-25 beats with average rate 115 bpm, frequent PACs 5.1%, minimum heart rate 46 maximum 162 and average of 66 her dominant underlying rhythm was sinus rhythm, slight P wave morphology changes were noted, 22 supraventricular tachycardia runs occurred, the run with the fastest interval lasting 9.6 seconds with a max rate of 162, average 155, the run with the fastest interval was also the longest some episodes of supraventricular tachycardia may be possible atrial tachycardia with variable block others were rare, ventricular bigeminy and trigeminy were present.  Review of blood  work showed in June 18, 2023: Hemoglobin A1c 6.4, folate normal, B12 918, in April 2024 her hemoglobin A1c was 6, BMP was unremarkable with normal BUN and creatinine, TSH in March 2024 was normal 2.669, B12 in February 2024 was normal at 507, ferritin in November 2023 was 19 very low  Brief overview of multiple care everywhere notes shows that she visited cardiology in September 2024 for edema of the right ankle and she had also been seen for swelling in the legs in the ED, an ultrasound peripheral venous of the legs was performed, it also appears she has been seen at cardiology and had Holter monitors for shortness of breath and other workup.  She is also seen neurology at [facility] for sleep apnea.  Also been seen by hematology in the past for iron deficiency anemia.  Multiple other conditions including low back pain, vertigo, fatigue, depression, COVID.  She also had appointments with ophthalmology at [facility].  07/01/2022:  Narrative  CLINICAL DATA:  83 year old female with headache and neck pain following fall.  EXAM: CT HEAD WITHOUT CONTRAST  CT CERVICAL SPINE WITHOUT CONTRAST  TECHNIQUE: Multidetector CT imaging of the head and cervical spine was ...        Study Result  Narrative & Impression  CLINICAL DATA:  83 year old female with headache and neck pain following fall.   EXAM: CT HEAD WITHOUT CONTRAST   CT CERVICAL SPINE WITHOUT CONTRAST   TECHNIQUE: Multidetector CT imaging of the head and cervical spine was performed following the standard protocol without  intravenous contrast. Multiplanar CT image reconstructions of the cervical spine were also generated.   RADIATION DOSE REDUCTION: This exam was performed according to the departmental dose-optimization program which includes automated exposure control, adjustment of the mA and/or kV according to patient size and/or use of iterative reconstruction technique.   COMPARISON:  05/07/2022 CTA head, 07/24/2019  brain MR, 03/24/2012 CT cervical spine and prior studies   FINDINGS: CT HEAD FINDINGS   Brain: No evidence of acute infarction, hemorrhage, hydrocephalus, extra-axial collection or mass lesion/mass effect.   Atrophy and chronic small-vessel white matter ischemic changes again noted.   Vascular: Carotid and vertebral atherosclerotic calcifications are noted.   Skull: Normal. Negative for fracture or focal lesion.   Sinuses/Orbits: No acute finding.   Other: RIGHT posterior scalp soft tissue swelling is noted.   CT CERVICAL SPINE FINDINGS   Alignment: Normal.   Skull base and vertebrae: No acute fracture. 30% compression of C7 is unchanged from 2013. No primary bone lesion or focal pathologic process.   Soft tissues and spinal canal: No prevertebral fluid or swelling. No visible canal hematoma.   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   Upper chest: No acute abnormality.   Other: None   IMPRESSION: 1. No evidence of acute intracranial abnormality. Atrophy and chronic small-vessel white matter ischemic changes. 2. RIGHT posterior scalp soft tissue swelling without fracture. 3. No static evidence of acute injury to the cervical spine.     Electronically Signed   By: Reyes Phi M.D.   On: 07/01/2022 16:19    Review of Systems: Patient complains of symptoms per HPI as well as the following symptoms none. Pertinent negatives and positives per HPI. All others negative.   Social History   Socioeconomic History   Marital status: Married    Spouse name: Not on file   Number of children: Not on file   Years of education: Not on file   Highest education level: Not on file  Occupational History   Not on file  Tobacco Use   Smoking status: Never   Smokeless tobacco: Never  Vaping Use   Vaping status: Never Used  Substance and Sexual Activity   Alcohol use: No   Drug use: Never   Sexual activity: Not Currently  Other Topics  Concern   Not on file  Social History Narrative   Not on file   Social Drivers of Health   Financial Resource Strain: Not on file  Food Insecurity: Not on file  Transportation Needs: Not on file  Physical Activity: Not on file  Stress: Not on file  Social Connections: Not on file  Intimate Partner Violence: Not on file    Family History  Problem Relation Age of Onset   Heart attack Father     Past Medical History:  Diagnosis Date   Anemia    Anxiety    Closed compression fracture of body of lumbar vertebra (HCC)    L2   Hiatal hernia    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   OCD (obsessive compulsive disorder)     Patient Active Problem List   Diagnosis Date Noted   GERD without esophagitis 08/28/2023   Iron deficiency anemia due to chronic blood loss 08/28/2023   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 08/28/2023   Vertigo of central origin 08/28/2023   Osteoarthritis 08/28/2023   Primary hypertension 08/28/2023   Depression 08/28/2023   Sleep apnea 08/28/2023   Premature atrial complexes 08/28/2023  Pseudophakia of both eyes 08/28/2023   Hyperlipidemia 08/28/2023   Nocturia 08/28/2023   Age-related osteoporosis without current pathological fracture 08/28/2023   SOB (shortness of breath) 08/28/2023   Postoperative anemia due to acute blood loss 07/20/2022   Constipation--acute on chronic 07/20/2022   History of vertigo 07/20/2022   Anxiety reaction    Femur fracture, right (HCC) 07/03/2022   Closed right hip fracture (HCC) 07/01/2022   Hip fracture (HCC) 07/01/2022   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   Vertigo 10/01/2019    Past Surgical History:  Procedure Laterality Date   ABDOMINAL HYSTERECTOMY     COLONOSCOPY     INTRAMEDULLARY (IM) NAIL INTERTROCHANTERIC Right 07/02/2022   Procedure: INTRAMEDULLARY (IM) NAIL INTERTROCHANTERIC;  Surgeon: Kendal Franky SQUIBB, MD;  Location: [facility] OR;  Service: Orthopedics;  Laterality: Right;     Current Outpatient Medications  Medication Sig Dispense Refill   acetaminophen  (TYLENOL ) 325 MG tablet Take 1-2 tablets (325-650 mg total) by mouth every 4 (four) hours as needed for mild pain. 100 tablet 0   alendronate (FOSAMAX) 70 MG tablet Take 70 mg by mouth once a week.     cetirizine (ZYRTEC) 5 MG tablet Take 5 mg by mouth daily.     citalopram  (CELEXA ) 40 MG tablet Take 20 mg by mouth daily.      famotidine (PEPCID) 20 MG tablet      FEROSUL 325 (65 Fe) MG tablet Take 1 tablet (325 mg total) by mouth 2 (two) times daily with a meal.  3   loratadine  (CLARITIN ) 10 MG tablet      melatonin 5 MG TABS Take 1 tablet (5 mg total) by mouth at bedtime. 30 tablet 0   pantoprazole  (PROTONIX ) 20 MG tablet Take 20 mg by mouth daily.     polyethylene glycol (MIRALAX  / GLYCOLAX ) 17 g packet Take 17 g by mouth daily as needed for mild constipation. 14 each 0   senna-docusate (SENOKOT-S) 8.6-50 MG tablet Take 2 tablets by mouth at bedtime as needed for mild constipation. 60 tablet 0   Vitamin D , Ergocalciferol , (DRISDOL ) 1.25 MG (50000 UNIT) CAPS capsule Take 1 capsule (50,000 Units total) by mouth every 7 (seven) days. 5 capsule 0   apixaban  (ELIQUIS ) 2.5 MG TABS tablet Take 1 tablet (2.5 mg total) by mouth 2 (two) times daily. (Patient not taking: Reported on 12/27/2023) 60 tablet 0   metoprolol  succinate (TOPROL -XL) 50 MG 24 hr tablet Take 50 mg by mouth. Take with or immediately following a meal. (Patient not taking: Reported on 12/27/2023)     No current facility-administered medications for this visit.    Allergies as of 12/27/2023 - Review Complete 12/27/2023  Allergen Reaction Noted   Acetaminophen -codeine Other (See Comments) 09/26/2022   Polyoxyethylene 40 sorbitol  septaoleate [sorbitan]  07/19/2022    Vitals: BP 132/80   Pulse 76   Ht 5' (1.524 m)   Wt 138 lb 6.4 oz (62.8 kg)   BMI 27.03 kg/m  Last Weight:  Wt Readings from Last 1 Encounters:  12/27/23 138 lb 6.4 oz (62.8 kg)    Last Height:   Ht Readings from Last 1 Encounters:  12/27/23 5' (1.524 m)     Physical exam: Exam: Gen: NAD, conversant, well nourised, well groomed                     CV: RRR, no MRG. No Carotid Bruits.  Dependent rubor in her feet distally, pitting edema in her lower extremities distally, toes  feel cool but I can feel distal pulses. Eyes: Conjunctivae clear without exudates or hemorrhage  Neuro: Detailed Neurologic Exam  Speech:    Speech is normal.  Cognition:    The patient is oriented to person, place, and time;     recent and remote memory impaired;     language fluent;     Impaired attention, concentration,   fund of knowledge Cranial Nerves:    The pupils are equal, round, and reactive to light.  Pupils too small to evaluate fundi. Visual fields are full to threat in all quadrants. Extraocular movements are intact. Trigeminal sensation is intact and the muscles of mastication are normal. The face is symmetric. The palate elevates in the midline. Hearing intact. Voice is normal. Shoulder shrug is normal. The tongue has normal motion without fasciculations.   Coordination: No ataxia or dysmetria noted Gait: Not parkinsonian, not ataxic, Motor Observation:    no involuntary movements noted. Tone:    Normal muscle tone.    Posture:    Posture is normal. normal erect    Strength: No focal deficits in strength    Sensation: intact to LT, temperature, pinprick, proprioception, vibration distally in the arms and legs     Reflex Exam:  DTR's:    Deep tendon reflexes in the upper and lower extremities are symmetrical bilaterally.  Intact reflexes the bilateral Achilles tendons of the feet. Toes:    The toes are equivocal bilaterally.   Clonus:    No clonus appreciated.    Assessment/Plan:  83 year old here for foot pain, headache.   - DEPENDENT RUBOR, SWELLING, PITTING EDEMA: Does follow-up with primary care for vascular causes, she is also missing her  medications including metoprolol  and Eliquis , family is aware and helping her.  Her examination is negative for distal polyneuropathy, she is intact distally to pinprick, light touch, vibration, proprioception, cold.  Her symptoms are more consistent with above diagnosis. Details: She states her feet are red, tight, uncomfortable, swelling and they change colors. Feel tight.  Patient is here with her daughter-in-law who provides information.  Patient states that she has been having problems with swelling in her legs.  Her feet can change colors.  She feels better when propping them up and worse when they are dependent.  In the office today she has dependent rubor, pitting edema, she is a poor historian.  We spent an extended visit talking about dependent rubor likely being a vascular causes.  Also her sensory exam is intact to cold/pinprick/light touch/vibration and proprioception distally in the lower extremities and there is a definite difference when patient has her legs dependent versus up on a chair, there is dependent rubor in the feet and pitting edema.  Recommend she follow-up with primary care for evaluation of vascular causes.  Also she has not been taking her medications, daughter and son are aware and we recommended medication packs and also consider memory loss and further evaluate whether patient should be living independently.  - HEADACHES: Be due to patient missing her metoprolol , in 2022 when we last saw her she denied any headaches, now she states her head hurts all the time, I recommend restarting metoprolol  and that her family helps her further with medication management and consider her current living status which is independent, she is a poor historian and she is missing medications. Details Patient also states that her head hurts all the time.  She cannot give me a timeframe on that.  Last time she was  here she denied any headaches whatsoever.  It could be that she has not been taking  her metoprolol  recently and also has not been taking her Eliquis  and possibly other medications.  I recommended she restart her medications, and see how this affects her headaches,  - Sleep Study: we also recommended a sleep study several years ago that was ordered was never completed.  I can send her an overnight O2 monitor  Details: as she states that she does not take naps, she is not tired during the day, denies snoring, denies morning headaches but then contradicts herself stating that she has headaches 24 x 7 in does not wake up in the morning feeling refreshed.  Also under an overnight oxygenation monitor.  - Memory loss:  Family should assess patient to see if they feel comfortable with her living independently since she is forgetting her medications and she does not appear to be a very good historian, daughter-in-law and son are aware of this.  Again we recommended getting her help and also other supports such as prepackaged medications. If they want her to be evaluated for memory we are happy to do so.  Headache follow up -She can follow-up with Lauraine Born in 6 months for headaches or family can call us , if she would like an evaluation for memory this would be a new consult and would have to be scheduled with physician, myself Dr. Ines. Send message to my team to call her son, office was closed when she left Thursday due to long appointment.  - Depression: follow up with primary care   Cc: Evangelina Tinnie Vonzella Dwane Alfonso DEVONNA  Onetha Ines, MD  [facility] [address] [place], [place] [address]  Phone [phone] Fax [phone]  I spent over minutes of face-to-face and non-face-to-face time with patient on the  1. Pain in both feet   2. Dependent rubor   3. Edema of both lower legs   4. Chronic nonintractable headache, unspecified headache type    diagnosis.  This included previsit chart review, lab review, study review,  order entry, electronic health record documentation, patient education on the different diagnostic and therapeutic options, counseling and coordination of care, risks and benefits of management, compliance, or risk factor reduction

## 2023-12-27 NOTE — Patient Instructions (Addendum)
    Dependent rubor is a reddish discoloration of the skin that occurs when a limb is lowered below the level of the heart.  Causes:  Dependent rubor is typically caused by peripheral artery disease (PAD), a condition in which narrowed arteries reduce blood flow to the extremities. Other possible causes include:  Venous insufficiency, Deep vein thrombosis (DVT), and Arteriovenous malformation.  Appearance:  The affected area appears red or dusky-red, and the color may intensify over time. The redness is usually more pronounced when the limb is dependent and less noticeable when it is elevated.  Diagnosis:  Dependent rubor is a clinical sign that can be observed by a healthcare professional. Other tests, such as an ankle-brachial index (ABI) or ultrasound, may be used to confirm the diagnosis of PAD.  Treatment:  Treatment for dependent rubor depends on the underlying cause. For PAD, treatment may include lifestyle modifications (e.g., quitting smoking), medications (e.g., aspirin, statins), or surgery (e.g., angioplasty, bypass surgery) to improve blood flow to the affected area

## 2023-12-30 ENCOUNTER — Encounter: Payer: Self-pay | Admitting: Neurology

## 2023-12-30 ENCOUNTER — Telehealth: Payer: Self-pay | Admitting: Neurology

## 2023-12-30 NOTE — Telephone Encounter (Signed)
 Office was closed Thursday when patient left.  Her chief complaints at last week's appointments were foot pain and headache which I addressed with daughter-in-law and patient.  Please give her son a call, she can follow-up with Lauraine Born in 6 months for headaches or family can call us  if needed (headaches may be due to patient not taking her medications especially metoprolol  which is a headache medication).  So they can schedule a follow-up with Lauraine for this now or see how patient does with resuming medications properly get back to us  with follow-up.    I did notice at appointment that she is a poor historian and possibly forgetting her medications(patient states she hasn't been able to get to the pharmacy to get them not forgetting), family is going to help her as she was seen with daughter in law.  In the meantime we recommend that they reevaluate her independent living situation to ensure she is safe at home or may need assisted living. Would discuss with pcp if needed. If family would like a neurologic evaluation for memory this would be a new consult and would have to be scheduled with physician, myself Dr. Ines.    thanks

## 2023-12-31 ENCOUNTER — Telehealth: Payer: Self-pay | Admitting: *Deleted

## 2023-12-31 DIAGNOSIS — G4734 Idiopathic sleep related nonobstructive alveolar hypoventilation: Secondary | ICD-10-CM

## 2023-12-31 DIAGNOSIS — R519 Headache, unspecified: Secondary | ICD-10-CM

## 2023-12-31 NOTE — Telephone Encounter (Signed)
-----   Message from Amber Ruiz sent at 12/30/2023  7:35 PM EST ----- Regarding: Please order overnight oxygenation monitor Please order overnight O2 monitor for patient to evaluate for hypoxemia, morning headache, she does not use oxygen at night.

## 2023-12-31 NOTE — Telephone Encounter (Addendum)
 I called son.  He stated that he is aware of the plan due to his wifes notes.  Aware could be her medications (not being taken regularly).  They had done medication boxes for her and will fill very week for her.  They have food delivery for her every week set up.  She understands she will call if needing anything from her family. They will try to get her to move closer. Appreciated call.   Would like to schedule in 6 months. Then cancel of needs too.  I relayed if memory an issue see pcp then referral to neuro MD Tresia Fruit) if needed.  Made appt with SS/NP 07-16-2024 at 1315.  He verbalized understanding.  Appreciated follow up.

## 2023-12-31 NOTE — Telephone Encounter (Signed)
 Zott, Linnell Fulling, Otilio Jefferson, RN; [place], [place] Got It thank you

## 2023-12-31 NOTE — Telephone Encounter (Signed)
 ONO ordered. Order sent to [facility].

## 2024-05-02 IMAGING — CT CT VENOGRAM HEAD
1 of 7 series · 7 of 47 positions shown · IV contrast (Omnipaque)
Comparison: MR head without and with contrast 07/24/2019. CTA head
05/07/2022.

CLINICAL DATA: Headache for 2 days.  Fever.

EXAM:
CT VENOGRAM HEAD
TECHNIQUE: Venographic phase images of the brain were obtained following the
administration of intravenous contrast. Multiplanar reformats and
maximum intensity projections were generated.

[Series 2: head venogram · axial · 0.41mm/px · z∈[-347,-227]mm · 7 of 82 slices shown]
[im 11/82  brain]
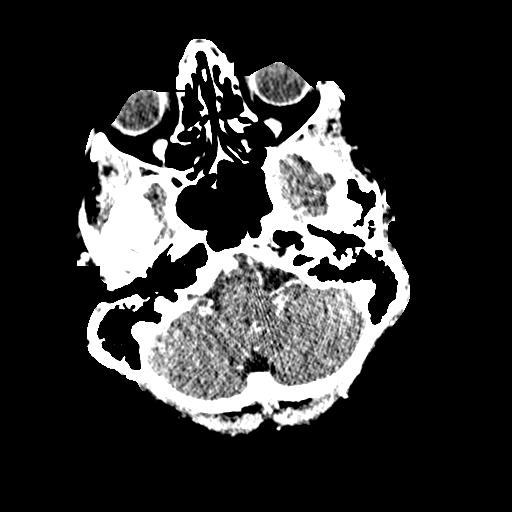
[im 21/82  bone]
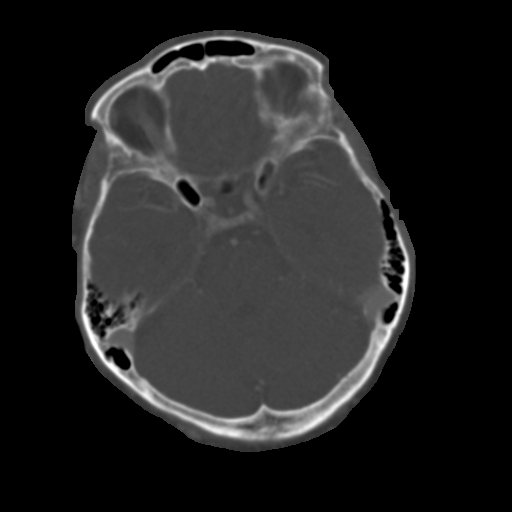
[im 31/82  brain]
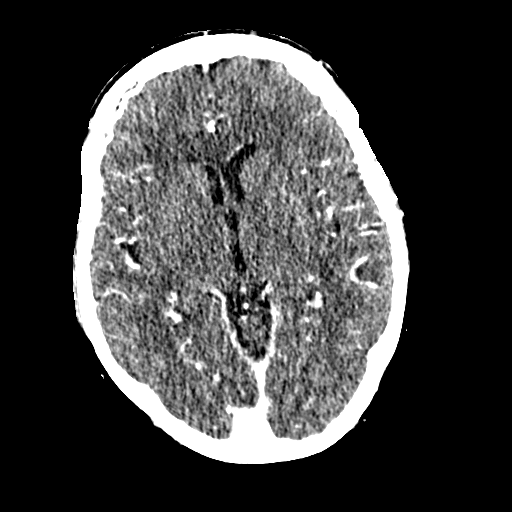
[im 41/82  bone]
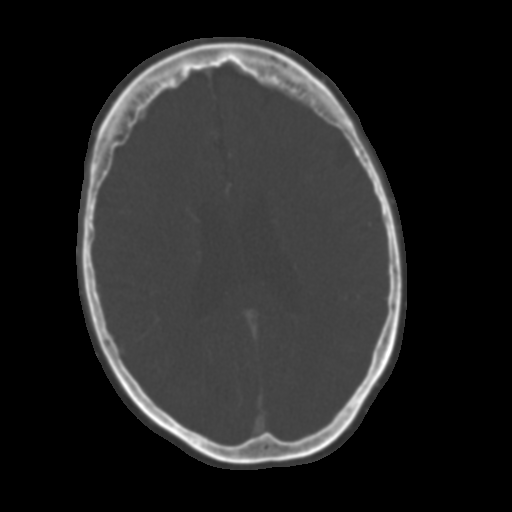
[im 51/82  brain]
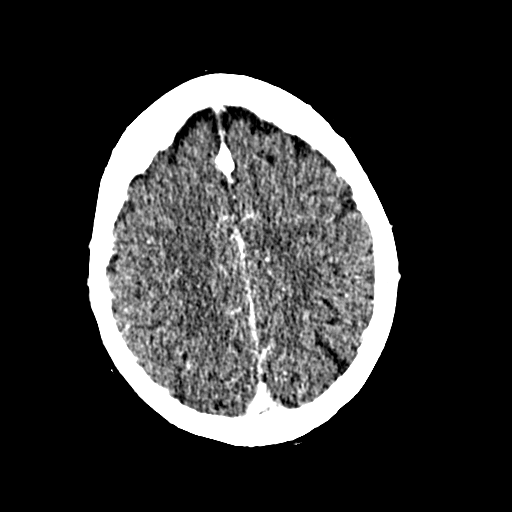
[im 61/82  bone]
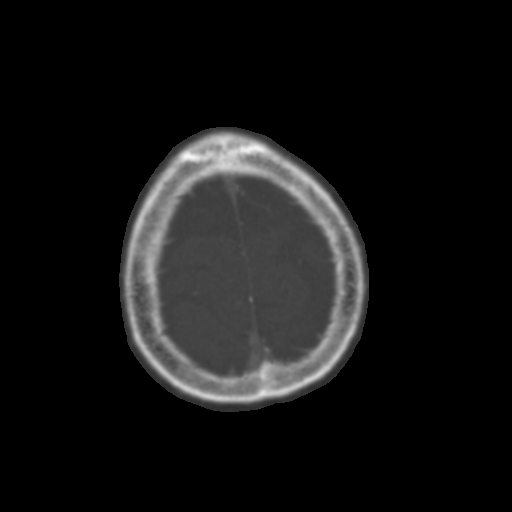
[im 71/82  brain]
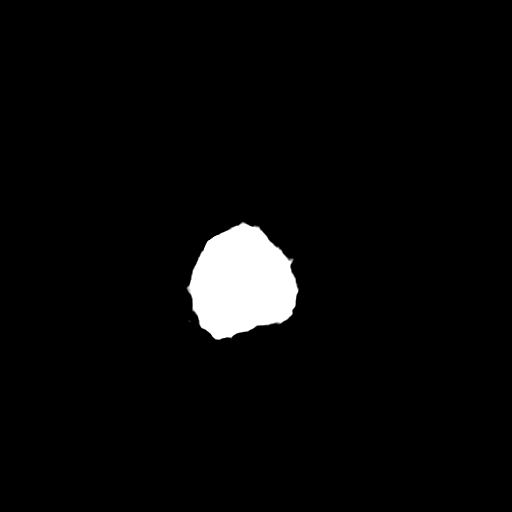

[7 of 47 positions shown; findings below may reference images not displayed]

RADIATION DOSE REDUCTION: This exam was performed according to the
departmental dose-optimization program which includes automated
exposure control, adjustment of the mA and/or kV according to
patient size and/or use of iterative reconstruction technique.

CONTRAST:  100mL OMNIPAQUE IOHEXOL 350 MG/ML SOLN
FINDINGS: The dural sinuses are patent. The straight sinus deep cerebral veins
are intact. Cortical veins are within normal limits. No significant
vascular malformation is evident. Right transverse sinus is
dominant. No pathologic enhancement is present.
IMPRESSION: Normal CT venogram of the head.

## 2024-07-16 ENCOUNTER — Ambulatory Visit: Payer: BLUE CROSS/BLUE SHIELD | Admitting: Neurology

## 2024-07-16 ENCOUNTER — Encounter: Payer: Self-pay | Admitting: Neurology

## 2024-07-16 NOTE — Progress Notes (Deleted)
 Patient: Amber Ruiz Date of Birth: [birth year]  Reason for Visit: Follow up History from: Patient Primary Neurologist:    ASSESSMENT AND PLAN 83 y.o. year old female    HISTORY OF PRESENT ILLNESS: Today 07/16/24 Saw Dr. Ines in Feb 2025, ordered ONO, recommended she be compliant with medications, more supervision from family, discuss vascular causes of leg pain with PCP.   HISTORY  12/27/23 AA: HPI:  Amber Ruiz is a 83 y.o. female here as requested by Evangelina Maxwell Elizab*for parasthesias. .  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   She states her feet are red, tight, uncomfortable, swelling and they change colors. Feel tight.  Patient is here with her daughter-in-law who provides information.  Patient states that she has been having problems with swelling in her legs.  Her feet can change colors.  She feels better when propping them up and worse when they are dependent.  In the office today she has dependent rubor, pitting edema, she is a poor historian.  We spent an extended visit talking about dependent rubor likely being a vascular causes.  Also her sensory exam is intact to cold/pinprick/light touch/vibration and proprioception distally in the lower extremities and there is a definite difference when patient has her legs dependent versus up on a chair, there is dependent rubor  in the feet and pitting edema.  Recommend she follow-up with primary care for evaluation of vascular causes.  Also she has not been taking her medications, daughter and son are aware and we recommended medication packs and also consider memory loss and further evaluate whether patient should be living independently.   Patient also states that her head hurts all the time.  She cannot give me a timeframe on that.  Last time she was here she denied any headaches whatsoever.  It could be that she has not been taking her metoprolol  recently and also has not been taking her Eliquis  and possibly other medications.  I recommended she restart her medications, and see how this affects her headaches, we also recommended a sleep study several years ago that was ordered was never completed.  I can send her an overnight O2 monitor as she states that she does not take naps, she is not tired during the day, denies snoring, denies morning headaches but then contradicts herself stating that she has headaches 24 x 7 in does not wake up in the morning feeling refreshed.  Also under an overnight oxygenation monitor.   Family should assess patient to see if they feel comfortable with her living independently since she is forgetting her medications and she does not appear to be a very good historian, daughter-in-law and son are aware of this.  Again we recommended getting her help and also other supports such as prepackaged medications.     Reviewed notes, labs and imaging from outside physicians, which showed:     I reviewed neurology notes: Patient was initially seen by Dr. Jenel in our practice in August 2020 for onset of vertigo.  She also  reported dizziness, memory problems and gait instability.  There is no mention of neuropathy in the feet however her gait was slightly wide-based possibly due to vertigo otherwise appeared normal.  MRI of the brain September 2020 results showed mild small vessel disease, unremarkable,  seventh 8th nerve complexes appeared normal.  She was seen after that and followed for episodic headache and vertigo possibly migraine or vertigo associated migraines, sent to vestibular rehab.  She was followed by her nurse practitioner Lauraine Born at the end of 2021 and in 2022 for same chief complaint.  In April 2017 patient's PCP was concerned about patient's memory and placed a formal referral to our office for memory impairment.  A search on epic globally for neuropathy showed no results.   I also reviewed her primary care referral which states that she has had tingling in both feet for the last few months, she denies injuries low back pain weakness, she does have longstanding prediabetes and history of B12 deficiency.  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  It appears she is on chronic long-term B12.  She has had a heart monitor in the past 8 to 14 days 22 episodes of A. tach lasting 4-25 beats with average rate 115 bpm, frequent PACs 5.1%, minimum heart rate 46 maximum 162 and average of 66 her dominant underlying rhythm was sinus rhythm, slight P wave morphology changes were noted, 22 supraventricular tachycardia runs occurred, the run with the fastest interval lasting 9.6 seconds with a max rate of 162, average 155,  the run with the fastest interval was also the longest some episodes of supraventricular tachycardia may be possible atrial tachycardia with variable block others were rare, ventricular bigeminy and trigeminy were present.   Review of blood work showed in June 18, 2023: Hemoglobin A1c 6.4, folate normal, B12 918, in April 2024 her hemoglobin A1c was 6, BMP was unremarkable with normal BUN and creatinine, TSH in March 2024 was normal 2.669, B12 in February 2024 was normal at 507, ferritin in November 2023 was 19 very low   Brief overview of multiple care everywhere notes shows that she visited cardiology in September 2024 for edema of the right ankle and she had also been seen for swelling in the legs in the ED, an ultrasound peripheral venous of the legs was performed, it also appears she has been seen at cardiology and had Holter monitors for shortness of breath and other workup.  She is also seen neurology at [facility] for sleep apnea.  Also been seen by hematology in the past for iron deficiency anemia.  Multiple other conditions including low back pain, vertigo, fatigue, depression, COVID.  She also had appointments with ophthalmology at [facility].  REVIEW OF SYSTEMS: Out of a complete 14 system review of symptoms, the patient complains only of the following symptoms, and all other reviewed systems are negative.  See HPI  ALLERGIES: Allergies  Allergen Reactions   Acetaminophen -Codeine Other (See Comments)    somnolence  Polyoxyethylene 40 Sorbitol  Septaoleate [Sorbitan]     Tremors/anxiety    HOME MEDICATIONS: Outpatient Medications Prior to Visit  Medication Sig Dispense Refill   acetaminophen  (TYLENOL ) 325 MG tablet Take 1-2 tablets (325-650 mg total) by mouth every 4 (four) hours as needed for mild pain. 100 tablet 0   alendronate (FOSAMAX) 70 MG tablet Take 70 mg by mouth once a week.     apixaban  (ELIQUIS ) 2.5 MG TABS tablet Take 1 tablet (2.5 mg total) by mouth 2 (two) times  daily. (Patient not taking: Reported on 12/27/2023) 60 tablet 0   cetirizine (ZYRTEC) 5 MG tablet Take 5 mg by mouth daily.     citalopram  (CELEXA ) 40 MG tablet Take 20 mg by mouth daily.      famotidine (PEPCID) 20 MG tablet      FEROSUL 325 (65 Fe) MG tablet Take 1 tablet (325 mg total) by mouth 2 (two) times daily with a meal.  3   loratadine  (CLARITIN ) 10 MG tablet      melatonin 5 MG TABS Take 1 tablet (5 mg total) by mouth at bedtime. 30 tablet 0   metoprolol  succinate (TOPROL -XL) 50 MG 24 hr tablet Take 50 mg by mouth. Take with or immediately following a meal. (Patient not taking: Reported on 12/27/2023)     pantoprazole  (PROTONIX ) 20 MG tablet Take 20 mg by mouth daily.     polyethylene glycol (MIRALAX  / GLYCOLAX ) 17 g packet Take 17 g by mouth daily as needed for mild constipation. 14 each 0   senna-docusate (SENOKOT-S) 8.6-50 MG tablet Take 2 tablets by mouth at bedtime as needed for mild constipation. 60 tablet 0   Vitamin D , Ergocalciferol , (DRISDOL ) 1.25 MG (50000 UNIT) CAPS capsule Take 1 capsule (50,000 Units total) by mouth every 7 (seven) days. 5 capsule 0   No facility-administered medications prior to visit.    PAST MEDICAL HISTORY: Past Medical History:  Diagnosis Date   Anemia    Anxiety    Closed compression fracture of body of lumbar vertebra (HCC)    L2   Hiatal hernia    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   OCD (obsessive compulsive disorder)     PAST SURGICAL HISTORY: Past Surgical History:  Procedure Laterality Date   ABDOMINAL HYSTERECTOMY     COLONOSCOPY     INTRAMEDULLARY (IM) NAIL INTERTROCHANTERIC Right 07/02/2022   Procedure: INTRAMEDULLARY (IM) NAIL INTERTROCHANTERIC;  Surgeon: Kendal Franky SQUIBB, MD;  Location: [facility] OR;  Service: Orthopedics;  Laterality: Right;    FAMILY HISTORY: Family History  Problem Relation Age of Onset   Heart attack Father     SOCIAL HISTORY: Social History   Socioeconomic History   Marital status: Married     Spouse name: Not on file   Number of children: Not on file   Years of education: Not on file   Highest education level: Not on file  Occupational History   Not on file  Tobacco Use   Smoking status: Never   Smokeless tobacco: Never  Vaping Use   Vaping status: Never Used  Substance and Sexual Activity   Alcohol use: No   Drug use: Never   Sexual activity: Not Currently  Other Topics Concern   Not on file  Social History Narrative   Not on file   Social Drivers of Health   Financial Resource Strain: Not on file  Food Insecurity: Not on file  Transportation Needs: Not on file  Physical Activity: Not on file  Stress:  Not on file  Social Connections: Not on file  Intimate Partner Violence: Not on file    PHYSICAL EXAM  There were no vitals filed for this visit. There is no height or weight on file to calculate BMI.  Generalized: Well developed, in no acute distress  Neurological examination  Mentation: Alert oriented to time, place, history taking. Follows all commands speech and language fluent Cranial nerve II-XII: Pupils were equal round reactive to light. Extraocular movements were full, visual field were full on confrontational test. Facial sensation and strength were normal. Uvula tongue midline. Head turning and shoulder shrug  were normal and symmetric. Motor: The motor testing reveals 5 over 5 strength of all 4 extremities. Good symmetric motor tone is noted throughout.  Sensory: Sensory testing is intact to soft touch on all 4 extremities. No evidence of extinction is noted.  Coordination: Cerebellar testing reveals good finger-nose-finger and heel-to-shin bilaterally.  Gait and station: Gait is normal. Tandem gait is normal. Romberg is negative. No drift is seen.  Reflexes: Deep tendon reflexes are symmetric and normal bilaterally.   DIAGNOSTIC DATA (LABS, IMAGING, TESTING) - I reviewed patient records, labs, notes, testing and imaging myself where  available.  Lab Results  Component Value Date   WBC 7.0 01/26/2023   HGB 12.5 01/26/2023   HCT 39.9 01/26/2023   MCV 88.5 01/26/2023   PLT 263 01/26/2023      Component Value Date/Time   NA 139 01/26/2023 1639   K 3.5 01/26/2023 1639   CL 103 01/26/2023 1639   CO2 26 01/26/2023 1639   GLUCOSE 95 01/26/2023 1639   BUN 11 01/26/2023 1639   CREATININE 0.63 01/26/2023 1639   CALCIUM 8.9 01/26/2023 1639   PROT 4.9 (L) 07/04/2022 0541   ALBUMIN 2.5 (L) 07/04/2022 0541   AST 18 07/04/2022 0541   ALT 7 07/04/2022 0541   ALKPHOS 44 07/04/2022 0541   BILITOT 0.7 07/04/2022 0541   GFRNONAA >60 01/26/2023 1639   GFRAA >60 06/16/2019 1624   No results found for: CHOL, HDL, LDLCALC, LDLDIRECT, TRIG, CHOLHDL No results found for: [id number] Lab Results  Component Value Date   VITAMINB12 452 06/30/2019   No results found for: TSH  Lauraine Born, AGNP-C, DNP 07/16/2024, 5:37 AM [facility] [address] [place], [place] [address] [phone]
